# Patient Record
Sex: Female | Born: 1980 | Hispanic: Yes | Marital: Married | Smoking: Never smoker
Health system: Southern US, Community
[De-identification: ages and names within clinical notes are randomized; demographics above are authoritative.]

## PROBLEM LIST (undated history)

## (undated) ENCOUNTER — Inpatient Hospital Stay (HOSPITAL_COMMUNITY): Payer: Self-pay

## (undated) ENCOUNTER — Ambulatory Visit (HOSPITAL_COMMUNITY): Payer: Self-pay | Source: Home / Self Care

## (undated) DIAGNOSIS — I499 Cardiac arrhythmia, unspecified: Secondary | ICD-10-CM

## (undated) DIAGNOSIS — F419 Anxiety disorder, unspecified: Secondary | ICD-10-CM

## (undated) DIAGNOSIS — R51 Headache: Secondary | ICD-10-CM

## (undated) DIAGNOSIS — R519 Headache, unspecified: Secondary | ICD-10-CM

## (undated) DIAGNOSIS — A630 Anogenital (venereal) warts: Secondary | ICD-10-CM

## (undated) DIAGNOSIS — K219 Gastro-esophageal reflux disease without esophagitis: Secondary | ICD-10-CM

## (undated) DIAGNOSIS — O24419 Gestational diabetes mellitus in pregnancy, unspecified control: Secondary | ICD-10-CM

## (undated) DIAGNOSIS — N390 Urinary tract infection, site not specified: Secondary | ICD-10-CM

## (undated) DIAGNOSIS — D219 Benign neoplasm of connective and other soft tissue, unspecified: Secondary | ICD-10-CM

## (undated) DIAGNOSIS — R002 Palpitations: Secondary | ICD-10-CM

## (undated) DIAGNOSIS — Z87448 Personal history of other diseases of urinary system: Secondary | ICD-10-CM

## (undated) DIAGNOSIS — K635 Polyp of colon: Secondary | ICD-10-CM

## (undated) DIAGNOSIS — Z8619 Personal history of other infectious and parasitic diseases: Secondary | ICD-10-CM

## (undated) HISTORY — DX: Cardiac arrhythmia, unspecified: I49.9

## (undated) HISTORY — DX: Headache: R51

## (undated) HISTORY — DX: Gastro-esophageal reflux disease without esophagitis: K21.9

## (undated) HISTORY — DX: Personal history of other diseases of urinary system: Z87.448

## (undated) HISTORY — PX: DILATION AND CURETTAGE OF UTERUS: SHX78

## (undated) HISTORY — DX: Palpitations: R00.2

## (undated) HISTORY — DX: Headache, unspecified: R51.9

## (undated) HISTORY — DX: Anogenital (venereal) warts: A63.0

## (undated) HISTORY — DX: Anxiety disorder, unspecified: F41.9

## (undated) HISTORY — DX: Polyp of colon: K63.5

## (undated) HISTORY — DX: Personal history of other infectious and parasitic diseases: Z86.19

---

## 2001-01-07 ENCOUNTER — Encounter: Admission: RE | Admit: 2001-01-07 | Discharge: 2001-01-07 | Payer: Self-pay | Admitting: Hematology and Oncology

## 2001-01-07 ENCOUNTER — Encounter: Payer: Self-pay | Admitting: Internal Medicine

## 2011-03-13 LAB — HM PAP SMEAR: HM Pap smear: NORMAL

## 2012-12-29 ENCOUNTER — Ambulatory Visit: Payer: Self-pay | Admitting: Internal Medicine

## 2013-01-12 ENCOUNTER — Ambulatory Visit: Payer: Self-pay | Admitting: Internal Medicine

## 2013-04-15 ENCOUNTER — Ambulatory Visit (INDEPENDENT_AMBULATORY_CARE_PROVIDER_SITE_OTHER): Payer: BC Managed Care – PPO | Admitting: Internal Medicine

## 2013-04-15 ENCOUNTER — Encounter: Payer: Self-pay | Admitting: Internal Medicine

## 2013-04-15 VITALS — BP 118/74 | HR 76 | Temp 98.8°F | Ht 63.5 in | Wt 184.0 lb

## 2013-04-15 DIAGNOSIS — Z0000 Encounter for general adult medical examination without abnormal findings: Secondary | ICD-10-CM

## 2013-04-15 DIAGNOSIS — R519 Headache, unspecified: Secondary | ICD-10-CM | POA: Insufficient documentation

## 2013-04-15 DIAGNOSIS — R002 Palpitations: Secondary | ICD-10-CM

## 2013-04-15 DIAGNOSIS — Z13220 Encounter for screening for lipoid disorders: Secondary | ICD-10-CM

## 2013-04-15 DIAGNOSIS — R51 Headache: Secondary | ICD-10-CM

## 2013-04-15 DIAGNOSIS — R319 Hematuria, unspecified: Secondary | ICD-10-CM

## 2013-04-15 LAB — POCT URINALYSIS DIPSTICK
Bilirubin, UA: NEGATIVE
Glucose, UA: NEGATIVE
Ketones, UA: NEGATIVE
Nitrite, UA: NEGATIVE
Spec Grav, UA: 1.025
Urobilinogen, UA: 0.2
pH, UA: 6.5

## 2013-04-15 NOTE — Assessment & Plan Note (Signed)
Urinalysis today: trace blood, moderate leuks (no s/s of infection present) Will check CBC and CMET today  Will follow up after labs

## 2013-04-15 NOTE — Assessment & Plan Note (Signed)
?   Anxiety Will check lytes and TSH today If normal, will consider referral to cardiology for holter monitor ECK reviewed and scanned into chart

## 2013-04-15 NOTE — Patient Instructions (Signed)

## 2013-04-15 NOTE — Addendum Note (Signed)
Addended by: Lurlean Nanny on: 04/15/2013 04:46 PM   Modules accepted: Orders

## 2013-04-15 NOTE — Assessment & Plan Note (Signed)
Advised her to continue Ibuprofen Encouraged her to get an eye exam with optho

## 2013-04-15 NOTE — Progress Notes (Signed)
HPI  Pt presents to the clinic today to establish care. She does not have a PCP. She goes to UC when she needs something. She does have some concerns about palpitations, chest pain and her left side going numb. This started more than a year ago. She has been seen at [facility] for the same. Vitals and ECG were normal. She denies shortness of breath, dizziness with these episodes. She reports that the numbness can occur with or without the palpitations. She does have a h/o a herniated disc in her back but does not know if this is causing the numbness in her arms. Additionally, she c/o frequent headaches. The headaches originates in her temples causing her eyes to hurt. She does have some light sensitivity. Usually, she can take Ibuprofen and sleep the headache off. She has not had an eye exam.  She also c/o blood in her urine. She reports this started during her pregnancy. It has continued since that time. She was supposed to follow up with her PCP but has not been able to do so.  Flu: never Tetanus: 2013 LMP: 03/29/13 Pap Smear: 2013 Dentist: biannually  Past Medical History  Diagnosis Date  . Chicken pox   . Frequent headaches   . Arrhythmia   . H/O hematuria     No current outpatient prescriptions on file.   No current facility-administered medications for this visit.    No Known Allergies  Family History  Problem Relation Age of Onset  . Hyperlipidemia Mother   . Hypertension Mother   . Drug abuse Father   . Arthritis Maternal Grandmother   . Hyperlipidemia Maternal Grandmother   . Heart disease Maternal Grandmother   . Hypertension Maternal Grandmother   . Diabetes Maternal Grandmother   . Heart disease Maternal Grandfather     History   Social History  . Marital Status: Married    Spouse Name: N/A    Number of Children: N/A  . Years of Education: N/A   Occupational History  . Not on file.   Social History Main Topics  . Smoking status: Never Smoker   . Smokeless  tobacco: Not on file  . Alcohol Use: Yes     Comment: occasional  . Drug Use: No  . Sexual Activity: Not on file   Other Topics Concern  . Not on file   Social History Narrative  . No narrative on file    ROS:  Constitutional: Denies fever, malaise, fatigue, or abrupt weight changes.  HEENT: Denies eye pain, eye redness, ear pain, ringing in the ears, wax buildup, runny nose, nasal congestion, bloody nose, or sore throat. Respiratory: Denies difficulty breathing, shortness of breath, cough or sputum production.   Cardiovascular: Denies chest pain, chest tightness, or swelling in the hands or feet.  Gastrointestinal: Denies abdominal pain, bloating, constipation, diarrhea or blood in the stool.  GU: Denies frequency, urgency, pain with urination, blood in urine, odor or discharge. Musculoskeletal: Denies decrease in range of motion, difficulty with gait, muscle pain or joint pain and swelling.  Skin: Denies redness, rashes, lesions or ulcercations.  Neurological: Denies dizziness, difficulty with memory, difficulty with speech or problems with balance and coordination.   No other specific complaints in a complete review of systems (except as listed in HPI above).  PE:  BP 118/74  Pulse 76  Temp(Src) 98.8 F (37.1 C) (Oral)  Ht 5' 3.5" (1.613 m)  Wt 184 lb (83.462 kg)  BMI 32.08 kg/m2  SpO2 98%  LMP  03/29/2013 Wt Readings from Last 3 Encounters:  04/15/13 184 lb (83.462 kg)    General: Appears her stated age, well developed, well nourished in NAD. HEENT: Head: normal shape and size; Eyes: sclera white, no icterus, conjunctiva pink, PERRLA and EOMs intact, very sensitive to the light of the opthalmascope; Ears: Tm's gray and intact, normal light reflex; Nose: mucosa pink and moist, septum midline; Throat/Mouth: Teeth present, mucosa pink and moist, no lesions or ulcerations noted.  Neck: Normal range of motion. Neck supple, trachea midline. No massses, lumps or thyromegaly  present.  Cardiovascular: Normal rate and rhythm. S1,S2 noted.  No murmur, rubs or gallops noted. No JVD or BLE edema. No carotid bruits noted. Pulmonary/Chest: Normal effort and positive vesicular breath sounds. No respiratory distress. No wheezes, rales or ronchi noted.  Abdomen: Soft and nontender. Normal bowel sounds, no bruits noted. No distention or masses noted. Liver, spleen and kidneys non palpable. Musculoskeletal: Normal range of motion. No signs of joint swelling. No difficulty with gait.  Neurological: Alert and oriented. Cranial nerves II-XII intact. Coordination normal. +DTRs bilaterally. Psychiatric: Mood and affect normal. Behavior is normal. Judgment and thought content normal.      Assessment and Plan:  Health Maintenance:  She declines flu shot today Will check CBC, CMET and lipid profile today Advised her to work on diet and exercise

## 2013-04-15 NOTE — Progress Notes (Signed)
Pre-visit discussion using our clinic review tool. No additional management support is needed unless otherwise documented below in the visit note.  

## 2013-04-15 NOTE — Progress Notes (Signed)
HPI: Pt presents to the office today to establish care. She has been going to Urgent care for her acute needs and has not had a PCP in many years. Pt did have a few concerns to address today. She reports having some heart palpitations/ "flutters" with some sharp chest pain. The patient has been evaluated by ED and urgent care, without any abnormal findings. She reports this episodes to be worse at night and not associated with anything that she can recall. She does not drink caffeine, reports eating a balanced diet and exercising. She denies shortness of breath or difficulty breathing during these spells. The patient also reports concerns regarding numbness to left side, she has been evaluated by the ER and urgent care for this issue as well.  This numbness can be linked to heart palpitations as well as on there own. She does report having a herniated disc in her back and was told she this could be causing her numbness. She does not have difficulty gait, pain, or dizziness during these spells. She does not take anything at this time. They do go away on there own. Other concerns presented are regarding headaches and eye pain. Pt reports that in the evening she having severe light sensitivity which cause a headache at her temples and behind her eyes. She has not had her eyes examined in many years, she denies blurry vision or doubled vision. She is able to take OTC ibuprofen and sleep, which resolves this issues. Finally the patient requests to have a UA done due to previous concerns of blood in her urine. This started during her last pregnancy a year ago. She was told to follow up with a PCP, but has been unable to do  this.    Otherwise she is losing weight. Has lost about 8lbs in 3 weeks. Working out with husband and eating better.   Flu shot: never; declines Tetanus: received today; LMP: 03/29/13 Pap smear: 2014 Dentist: 2014  Past Medical History  Diagnosis Date  . Chicken pox   . Frequent headaches    . Arrhythmia   . H/O hematuria     No current outpatient prescriptions on file.   No current facility-administered medications for this visit.    No Known Allergies  Family History  Problem Relation Age of Onset  . Hyperlipidemia Mother   . Hypertension Mother   . Drug abuse Father   . Arthritis Maternal Grandmother   . Hyperlipidemia Maternal Grandmother   . Heart disease Maternal Grandmother   . Hypertension Maternal Grandmother   . Diabetes Maternal Grandmother   . Heart disease Maternal Grandfather     History   Social History  . Marital Status: Married    Spouse Name: N/A    Number of Children: N/A  . Years of Education: N/A   Occupational History  . Not on file.   Social History Main Topics  . Smoking status: Never Smoker   . Smokeless tobacco: Not on file  . Alcohol Use: Yes     Comment: occasional  . Drug Use: No  . Sexual Activity: Not on file   Other Topics Concern  . Not on file   Social History Narrative  . No narrative on file    ROS:  Constitutional:Endorses headaches at night.  Denies fever, malaise, fatigue,or abrupt weight changes.  HEENT:Endorses eye pain at night.  Denies eye pain, eye redness, ear pain, ringing in the ears, wax buildup, runny nose, nasal congestion, bloody nose, or sore  throat. Respiratory: Denies difficulty breathing, shortness of breath, cough or sputum production.   Cardiovascular:Endorses palpitation, chest pain, and tightness at times, denies any this current visit. Denies or swelling in the hands or feet.  Gastrointestinal: Denies abdominal pain, bloating, constipation, diarrhea or blood in the stool.  GU: Denies frequency, urgency, pain with urination, blood in urine, odor or discharge. Musculoskeletal: Denies decrease in range of motion, difficulty with gait, muscle pain or joint pain and swelling.  Skin: Denies redness, rashes, lesions or ulcercations.  Neurological: Denies dizziness, difficulty with memory,  difficulty with speech or problems with balance and coordination.   No other specific complaints in a complete review of systems (except as listed in HPI above).  PE:  Ht 5' 3.5" (1.613 m)  Wt 184 lb (83.462 kg)  BMI 32.08 kg/m2  LMP 03/29/2013 Wt Readings from Last 3 Encounters:  04/15/13 184 lb (83.462 kg)    General: Appears their stated age, well developed, well nourished in NAD. HEENT: Head: normal shape and size; Eyes: sclera white, no icterus, conjunctiva pink, PERRLA and EOMs intact; noted light sensitivity during exam.  Ears: Tm's gray and intact, normal light reflex; Nose: mucosa pink and moist, septum midline; Throat/Mouth: Teeth present, mucosa pink and moist, no lesions or ulcerations noted.  Neck: Normal range of motion. Neck supple, trachea midline. No massses, lumps or thyromegaly present.  Cardiovascular: Normal rate and rhythm. S1,S2 noted.  No murmur, rubs or gallops noted. No JVD or BLE edema. No carotid bruits noted. Pulmonary/Chest: Normal effort and positive vesicular breath sounds. No respiratory distress. No wheezes, rales or ronchi noted.  Abdomen: Soft and nontender. Normal bowel sounds, no bruits noted. No distention or masses noted. Liver, spleen and kidneys non palpable. Musculoskeletal: Normal range of motion. No signs of joint swelling. No difficulty with gait.  Neurological: Alert and oriented. Cranial nerves II-XII intact. Coordination normal. +DTRs bilaterally. Psychiatric: Mood and affect normal. Behavior is normal. Judgment and thought content normal.    Assessment and Plan: Health Maintenance: Will check lipid panel, CBC, CMET, and TSH Continue exercise and diet Will call with results  Heart Palpitations Will continue to follow Will check TSH today as well as lipid panel  If needed will refer to cardiology for Holter monitor  Headaches/Eye Pain: Referred to opthalmology for eye exam  Numbness: Will continue to follow May refer to physical  therapy if this continues to bother her  Hematuria UA shows trace blood Will check kidney function and other labs Follow up with patient  Follow up for lab results and pap smear in 2 weeks  Laquia Rosano S, Student-NP

## 2013-04-16 LAB — COMPREHENSIVE METABOLIC PANEL
ALT: 23 U/L (ref 0–35)
AST: 16 U/L (ref 0–37)
Albumin: 4.1 g/dL (ref 3.5–5.2)
Alkaline Phosphatase: 72 U/L (ref 39–117)
BUN: 14 mg/dL (ref 6–23)
CO2: 26 mEq/L (ref 19–32)
Calcium: 9.2 mg/dL (ref 8.4–10.5)
Chloride: 106 mEq/L (ref 96–112)
Creatinine, Ser: 0.6 mg/dL (ref 0.4–1.2)
GFR: 127.05 mL/min (ref >60.00)
Glucose, Bld: 69 mg/dL — ABNORMAL LOW (ref 70–99)
Potassium: 4 mEq/L (ref 3.5–5.1)
Sodium: 138 mEq/L (ref 135–145)
Total Bilirubin: 0.5 mg/dL (ref 0.3–1.2)
Total Protein: 7.4 g/dL (ref 6.0–8.3)

## 2013-04-16 LAB — CBC
HCT: 41.9 % (ref 36.0–46.0)
Hemoglobin: 13.5 g/dL (ref 12.0–15.0)
MCHC: 32.3 g/dL (ref 30.0–36.0)
MCV: 86.8 fl (ref 78.0–100.0)
Platelets: 332 10*3/uL (ref 150.0–400.0)
RBC: 4.83 Mil/uL (ref 3.87–5.11)
RDW: 14.2 % (ref 11.5–14.6)
WBC: 9.7 10*3/uL (ref 4.5–10.5)

## 2013-04-16 LAB — TSH: TSH: 1.6 u[IU]/mL (ref 0.35–5.50)

## 2013-04-16 LAB — LIPID PANEL
Cholesterol: 199 mg/dL (ref 0–200)
HDL: 37.1 mg/dL — ABNORMAL LOW (ref >39.00)
LDL Cholesterol: 124 mg/dL — ABNORMAL HIGH (ref 0–99)
Total CHOL/HDL Ratio: 5
Triglycerides: 192 mg/dL — ABNORMAL HIGH (ref 0.0–149.0)
VLDL: 38.4 mg/dL (ref 0.0–40.0)

## 2013-05-14 ENCOUNTER — Ambulatory Visit: Payer: BC Managed Care – PPO | Admitting: Internal Medicine

## 2013-05-15 ENCOUNTER — Ambulatory Visit (INDEPENDENT_AMBULATORY_CARE_PROVIDER_SITE_OTHER): Payer: BC Managed Care – PPO | Admitting: Internal Medicine

## 2013-05-15 ENCOUNTER — Encounter: Payer: Self-pay | Admitting: Internal Medicine

## 2013-05-15 VITALS — BP 116/74 | HR 65 | Temp 98.1°F | Wt 181.5 lb

## 2013-05-15 DIAGNOSIS — E781 Pure hyperglyceridemia: Secondary | ICD-10-CM

## 2013-05-15 DIAGNOSIS — R3129 Other microscopic hematuria: Secondary | ICD-10-CM

## 2013-05-15 DIAGNOSIS — R202 Paresthesia of skin: Secondary | ICD-10-CM

## 2013-05-15 DIAGNOSIS — R209 Unspecified disturbances of skin sensation: Secondary | ICD-10-CM

## 2013-05-15 NOTE — Progress Notes (Signed)
Subjective:    Patient ID: Cassandra [name], female    DOB: Jan 02, 1981, 33 y.o.   MRN: [id number]  HPI  Pt presents to the clinic today with c/o left arm numbness. This has started over 1 year ago. The numbness comes and goes. Her headaches, chest pain, palpitations and dizziness has gotten much better since last time. She does report that she has a herniated disc in her neck. She does take Ibuprofen on occasion. She is not interested in a referral to neurosurgery at this time.   She is concerned about her triglycerides of 192. She reports that her diet is not that bad.  She is also concerned about the microscopic hematuria. She reports this was present during her pregnancy and has not resolved. She would like a further workup and diagnostic testing.  Review of Systems      Past Medical History  Diagnosis Date  . Chicken pox   . Frequent headaches   . Arrhythmia   . H/O hematuria   . Nocturnal headaches   . Heart palpitations     No current outpatient prescriptions on file.   No current facility-administered medications for this visit.    No Known Allergies  Family History  Problem Relation Age of Onset  . Hyperlipidemia Mother   . Hypertension Mother   . Drug abuse Father   . Arthritis Maternal Grandmother   . Hyperlipidemia Maternal Grandmother   . Heart disease Maternal Grandmother   . Hypertension Maternal Grandmother   . Diabetes Maternal Grandmother   . Heart disease Maternal Grandfather     History   Social History  . Marital Status: Married    Spouse Name: N/A    Number of Children: N/A  . Years of Education: N/A   Occupational History  . Not on file.   Social History Main Topics  . Smoking status: Never Smoker   . Smokeless tobacco: Not on file  . Alcohol Use: Yes     Comment: occasional  . Drug Use: No  . Sexual Activity: Yes    Birth Control/ Protection: IUD   Other Topics Concern  . Not on file   Social History Narrative  . No narrative  on file     Constitutional: Denies fever, malaise, fatigue, headache or abrupt weight changes.  GU: Pt reports blood in urine. Denies urgency, frequency, pain with urination, burning sensation, odor or discharge. Musculoskeletal: Denies decrease in range of motion, difficulty with gait, muscle pain or joint pain and swelling.  Neurological: Pt reports left arm numbness.  Denies dizziness, difficulty with memory, difficulty with speech or problems with balance and coordination.   No other specific complaints in a complete review of systems (except as listed in HPI above).  Objective:   Physical Exam   BP 116/74  Pulse 65  Temp(Src) 98.1 F (36.7 C) (Oral)  Wt 181 lb 8 oz (82.328 kg)  SpO2 99%  LMP 03/29/2013 Wt Readings from Last 3 Encounters:  05/15/13 181 lb 8 oz (82.328 kg)  04/15/13 184 lb (83.462 kg)    General: Appears her stated age, well developed, well nourished in NAD. Neck: Normal range of motion. Neck supple, trachea midline. No massses, lumps or thyromegaly present.  Cardiovascular: Normal rate and rhythm. S1,S2 noted.  No murmur, rubs or gallops noted. No JVD or BLE edema. No carotid bruits noted. Pulmonary/Chest: Normal effort and positive vesicular breath sounds. No respiratory distress. No wheezes, rales or ronchi noted.  Abdomen: Soft and  nontender. Normal bowel sounds, no bruits noted. No distention or masses noted. Liver, spleen and kidneys non palpable. Neurological: Alert and oriented. Cranial nerves II-XII intact. Coordination normal. +DTRs bilaterally.   BMET    Component Value Date/Time   NA 138 04/15/2013 1554   K 4.0 04/15/2013 1554   CL 106 04/15/2013 1554   CO2 26 04/15/2013 1554   GLUCOSE 69* 04/15/2013 1554   BUN 14 04/15/2013 1554   CREATININE 0.6 04/15/2013 1554   CALCIUM 9.2 04/15/2013 1554    Lipid Panel     Component Value Date/Time   CHOL 199 04/15/2013 1554   TRIG 192.0* 04/15/2013 1554   HDL 37.10* 04/15/2013 1554   CHOLHDL 5 04/15/2013 1554   VLDL  38.4 04/15/2013 1554   LDLCALC 124* 04/15/2013 1554    CBC    Component Value Date/Time   WBC 9.7 04/15/2013 1554   RBC 4.83 04/15/2013 1554   HGB 13.5 04/15/2013 1554   HCT 41.9 04/15/2013 1554   PLT 332.0 04/15/2013 1554   MCV 86.8 04/15/2013 1554   MCHC 32.3 04/15/2013 1554   RDW 14.2 04/15/2013 1554    Hgb A1C No results found for this basename: HGBA1C        Assessment & Plan:   Left arm paresthesia:  Try Ibuprofen OTC If pain becomes worse or more constant, can refer to neurosurgery for further evaluations Will look for most recent xrays- may need to be repeated, may need MRI  Microscopic hematuria:  She seems very concerned about this Will refer to urology for further evaluation and treatment  RTC as needed

## 2013-05-15 NOTE — Assessment & Plan Note (Signed)
Info given on low fat, low cholesterol diet Take fishoil OTC Will recheck in1 year

## 2013-05-15 NOTE — Progress Notes (Signed)
Pre visit review using our clinic review tool, if applicable. No additional management support is needed unless otherwise documented below in the visit note. 

## 2013-05-15 NOTE — Patient Instructions (Addendum)
Paresthesia °Paresthesia is an abnormal burning or prickling sensation. This sensation is generally felt in the hands, arms, legs, or feet. However, it may occur in any part of the body. It is usually not painful. The feeling may be described as: °· Tingling or numbness. °· "Pins and needles." °· Skin crawling. °· Buzzing. °· Limbs "falling asleep." °· Itching. °Most people experience temporary (transient) paresthesia at some time in their lives. °CAUSES  °Paresthesia may occur when you breathe too quickly (hyperventilation). It can also occur without any apparent cause. Commonly, paresthesia occurs when pressure is placed on a nerve. The feeling quickly goes away once the pressure is removed. For some people, however, paresthesia is a long-lasting (chronic) condition caused by an underlying disorder. The underlying disorder may be: °· A traumatic, direct injury to nerves. Examples include a: °· Broken (fractured) neck. °· Fractured skull. °· A disorder affecting the brain and spinal cord (central nervous system). Examples include: °· Transverse myelitis. °· Encephalitis. °· Transient ischemic attack. °· Multiple sclerosis. °· Stroke. °· Tumor or blood vessel problems, such as an arteriovenous malformation pressing against the brain or spinal cord. °· A condition that damages the peripheral nerves (peripheral neuropathy). Peripheral nerves are not part of the brain and spinal cord. These conditions include: °· Diabetes. °· Peripheral vascular disease. °· Nerve entrapment syndromes, such as carpal tunnel syndrome. °· Shingles. °· Hypothyroidism. °· Vitamin B12 deficiencies. °· Alcoholism. °· Heavy metal poisoning (lead, arsenic). °· Rheumatoid arthritis. °· Systemic lupus erythematosus. °DIAGNOSIS  °Your caregiver will attempt to find the underlying cause of your paresthesia. Your caregiver may: °· Take your medical history. °· Perform a physical exam. °· Order various lab tests. °· Order imaging tests. °TREATMENT    °Treatment for paresthesia depends on the underlying cause. °HOME CARE INSTRUCTIONS °· Avoid drinking alcohol. °· You may consider massage or acupuncture to help relieve your symptoms. °· Keep all follow-up appointments as directed by your caregiver. °SEEK IMMEDIATE MEDICAL CARE IF:  °· You feel weak. °· You have trouble walking or moving. °· You have problems with speech or vision. °· You feel confused. °· You cannot control your bladder or bowel movements. °· You feel numbness after an injury. °· You faint. °· Your burning or prickling feeling gets worse when walking. °· You have pain, cramps, or dizziness. °· You develop a rash. °MAKE SURE YOU: °· Understand these instructions. °· Will watch your condition. °· Will get help right away if you are not doing well or get worse. °Document Released: 02/16/2002 Document Revised: 05/21/2011 Document Reviewed: 11/17/2010 °ExitCare® Patient Information ©2014 [employer]. ° °

## 2013-10-20 ENCOUNTER — Telehealth: Payer: Self-pay | Admitting: Internal Medicine

## 2013-10-20 NOTE — Telephone Encounter (Signed)
She needs to come for an office visit

## 2013-10-20 NOTE — Telephone Encounter (Signed)
Patient Information:  Caller Name: Corin  Phone: [phone]  Patient: Cassandra [name], Cassandra [name]  Gender: Female  DOB: Jul 14, 1980  Age: 33 Years  PCP: Webb Silversmith  Pregnant: No  Office Follow Up:  Does the office need to follow up with this patient?: Yes  Instructions For The Office: Needs further instructions regarding scalp issues.  RN Note:  Patient also reports sections of her scalp burn at times. These are different areas. There is no redness, swelling or bruising noted. Please contact her at [phone] to give further instructions. She verbalized understanding of care advice for swimmer's ear pain.  Symptoms  Reason For Call & Symptoms: Reports left earache. Much worse when touching or moving the ear.  Reviewed Health History In EMR: Yes  Reviewed Medications In EMR: Yes  Reviewed Allergies In EMR: Yes  Reviewed Surgeries / Procedures: Yes  Date of Onset of Symptoms: 10/19/2013 OB / GYN:  LMP: 10/12/2013  Guideline(s) Used:  Earache  Ear - Swimmer's - Otitis Externa  Disposition Per Guideline:   Home Care  Reason For Disposition Reached:   Swimmer's Ear with no complications  Advice Given:  White Vinegar Rinses.   instructions on how to do this:  Lie down with the affected ear upward. Fill the ear canal.  After 5 minutes, remove the fluid by tilting the head to one side and gently pulling on the ear.  Continue doing this twice daily until the ear canal returns to normal.  CAUTION: Do not do use if you have ear tubes or hole in eardrum.  Pain Medicines:  For pain relief, you can take either acetaminophen, ibuprofen, or naproxen.  Ibuprofen (e.g., Motrin, Advil):  Take 400 mg (two 200 mg pills) by mouth every 6 hours.  Expected Course:  With treatment, symptoms should improve in 3 days and resolve within 7 days.  Avoid Earplugs:   If pus or cloudy fluid is draining from the ear canal, wipe the pus away as it appears. Avoid plugging with cotton (Reason: retained pus causes  irritation or infection of the ear canal).  Avoid Swimming:  Try to avoid swimming until symptoms are gone.  Call Back If:  You become worse.  Patient Will Follow Care Advice:  YES

## 2013-10-21 NOTE — Telephone Encounter (Signed)
Left detailed msg on VM per HIPAA letting pt know information as instructed

## 2013-11-12 ENCOUNTER — Telehealth: Payer: Self-pay | Admitting: Internal Medicine

## 2013-11-12 NOTE — Telephone Encounter (Signed)
Patient Information:  Caller Name: Brittony  Phone: [phone]  Patient: Destinee, Taber  Gender: Female  DOB: 11-13-1980  Age: 33 Years  PCP: Webb Silversmith  Pregnant: No  Office Follow Up:  Does the office need to follow up with this patient?: Yes  Instructions For The Office: see notes  RN Note:  She states she went to pharmacy to see if she could find something to treat it and was told by pharmacist to call office b/c will likely need prescription med. Pt states she is uninsured at the moment and she would like to know if the doctor will prescribe anything w/o an appt. Assured her will send message and someone will call her back today.  Symptoms  Reason For Call & Symptoms: Half of toenail on R big toe has been yellow for approx 6 months and then 2 wks ago turned green (states it looks "moldy") and then a week ago became painful. Denies seeing pus, denies skin redness around nail, no fever or any other sxs. During triage she she began to manipulate the toenail a bit and became concerned that it may be detaching from the skin.  Reviewed Health History In EMR: Yes  Reviewed Medications In EMR: Yes  Reviewed Allergies In EMR: Yes  Reviewed Surgeries / Procedures: Yes  Date of Onset of Symptoms: 10/29/2013 OB / GYN:  LMP: 10/27/2013  Guideline(s) Used:  Toe Pain  Disposition Per Guideline:   See Within 2 Weeks in Office  Reason For Disposition Reached:   Mild pain (e.g., does not interfere with normal activities) and present > 7 days  Advice Given:  N/A  Patient Will Follow Care Advice:  YES

## 2013-11-12 NOTE — Telephone Encounter (Signed)
She will need an appt.

## 2013-11-12 NOTE — Telephone Encounter (Signed)
Patient mad an appt for 11/17/13

## 2013-11-13 ENCOUNTER — Ambulatory Visit: Payer: BC Managed Care – PPO | Admitting: Family Medicine

## 2013-11-13 ENCOUNTER — Encounter (HOSPITAL_COMMUNITY): Payer: Self-pay | Admitting: Emergency Medicine

## 2013-11-13 ENCOUNTER — Emergency Department (HOSPITAL_COMMUNITY)
Admission: EM | Admit: 2013-11-13 | Discharge: 2013-11-13 | Disposition: A | Discharge status: Home / Self Care | Payer: BC Managed Care – PPO | Attending: Emergency Medicine | Admitting: Emergency Medicine

## 2013-11-13 ENCOUNTER — Telehealth: Payer: Self-pay | Admitting: Internal Medicine

## 2013-11-13 DIAGNOSIS — B9689 Other specified bacterial agents as the cause of diseases classified elsewhere: Secondary | ICD-10-CM | POA: Insufficient documentation

## 2013-11-13 DIAGNOSIS — N898 Other specified noninflammatory disorders of vagina: Secondary | ICD-10-CM | POA: Insufficient documentation

## 2013-11-13 DIAGNOSIS — F410 Panic disorder [episodic paroxysmal anxiety] without agoraphobia: Secondary | ICD-10-CM

## 2013-11-13 DIAGNOSIS — N760 Acute vaginitis: Secondary | ICD-10-CM | POA: Insufficient documentation

## 2013-11-13 DIAGNOSIS — B351 Tinea unguium: Secondary | ICD-10-CM | POA: Insufficient documentation

## 2013-11-13 DIAGNOSIS — A499 Bacterial infection, unspecified: Secondary | ICD-10-CM | POA: Insufficient documentation

## 2013-11-13 DIAGNOSIS — Z8679 Personal history of other diseases of the circulatory system: Secondary | ICD-10-CM | POA: Insufficient documentation

## 2013-11-13 DIAGNOSIS — Z3202 Encounter for pregnancy test, result negative: Secondary | ICD-10-CM | POA: Insufficient documentation

## 2013-11-13 LAB — URINALYSIS, ROUTINE W REFLEX MICROSCOPIC
Bilirubin Urine: NEGATIVE
Glucose, UA: NEGATIVE mg/dL
Hgb urine dipstick: NEGATIVE
Ketones, ur: NEGATIVE mg/dL
Nitrite: NEGATIVE
Protein, ur: NEGATIVE mg/dL
Specific Gravity, Urine: 1.028 (ref 1.005–1.030)
Urobilinogen, UA: 1 mg/dL (ref 0.0–1.0)
pH: 6 (ref 5.0–8.0)

## 2013-11-13 LAB — WET PREP, GENITAL
Trich, Wet Prep: NONE SEEN
Yeast Wet Prep HPF POC: NONE SEEN

## 2013-11-13 LAB — POC URINE PREG, ED: Preg Test, Ur: NEGATIVE

## 2013-11-13 LAB — URINE MICROSCOPIC-ADD ON

## 2013-11-13 MED ORDER — METRONIDAZOLE 500 MG PO TABS: 500.0000 mg | ORAL_TABLET | Freq: Two times a day (BID) | ORAL | Status: DC

## 2013-11-13 NOTE — ED Notes (Signed)
Pt c/o lower abd.pain, pain with intercourse, toenail discoloration, fatigue, panic attack. Was told to come to ed by her PCP

## 2013-11-13 NOTE — ED Provider Notes (Signed)
TIME SEEN: 4:35 PM  CHIEF COMPLAINT: right toenail discoloration and pain x 8 months, lower abdominal pain and vaginal discharge x 2 years, panic attack today  HPI: Pt is a 33 y.o. F with h/o headaches who presents to the ED with multiple complaints.  First she complains of yellow right great toenail discoloration x 8 months.  She states it started hurting today and she called her PCP who instructed her to come to the ED.  No injury.  She did have some tingling in her toe last night.  No h/o gout.  No fever.  No swelling.  She has not taken any medication for the toenail discoloration or pain.  She has not seen her PCP for this.   Second she complains of a panic attack today where she became upset, started uncontrollably crying, she tingling all over, felt her heart racing.  No chest pain, SOB.  She is now back to baseline.     Third she is complaining of 2 years of vaginal discharge and pelvic pain worse with intercourse.  She is a [id number].  LMP was 8/18 and her periods are irregular as she has a Mirena IUD.  She states she had her daughter 2 1/2 years ago and had the IUD placed and then began having pain.  She is sexually active with her husband.  She has not discussed this with her PCP or OBGYN.  States she started looking up her symptoms online and is worried she has cervical cancer.  She has had an abnormal pap smear in the past.  No N/V/D, dysuria or hematuria.  ROS: See HPI Constitutional: no fever  Eyes: no drainage  ENT: no runny nose   Cardiovascular:  no chest pain  Resp: no SOB  GI: no vomiting GU: no dysuria Integumentary: no rash  Allergy: no hives  Musculoskeletal: no leg swelling  Neurological: no slurred speech ROS otherwise negative  PAST MEDICAL HISTORY/PAST SURGICAL HISTORY:  Past Medical History  Diagnosis Date  . Chicken pox   . Frequent headaches   . Arrhythmia   . H/O hematuria   . Nocturnal headaches   . Heart palpitations     MEDICATIONS:  Prior to  Admission medications   Not on File    ALLERGIES:  No Known Allergies  SOCIAL HISTORY:  History  Substance Use Topics  . Smoking status: Never Smoker   . Smokeless tobacco: Not on file  . Alcohol Use: Yes     Comment: occasional    FAMILY HISTORY: Family History  Problem Relation Age of Onset  . Hyperlipidemia Mother   . Hypertension Mother   . Drug abuse Father   . Arthritis Maternal Grandmother   . Hyperlipidemia Maternal Grandmother   . Heart disease Maternal Grandmother   . Hypertension Maternal Grandmother   . Diabetes Maternal Grandmother   . Heart disease Maternal Grandfather     EXAM: BP 133/81  Pulse 91  Temp(Src) 98.7 F (37.1 C) (Oral)  Resp 20  SpO2 98% CONSTITUTIONAL: Alert and oriented and responds appropriately to questions. Well-appearing; well-nourished HEAD: Normocephalic EYES: Conjunctivae clear, PERRL ENT: normal nose; no rhinorrhea; moist mucous membranes; pharynx without lesions noted NECK: Supple, no meningismus, no LAD  CARD: RRR; S1 and S2 appreciated; no murmurs, no clicks, no rubs, no gallops RESP: Normal chest excursion without splinting or tachypnea; breath sounds clear and equal bilaterally; no wheezes, no rhonchi, no rales,  ABD/GI: Normal bowel sounds; non-distended; soft, non-tender, no rebound, no guarding GU:  Normal external genitalia, no vaginal bleeding, minimal amount of thin white vaginal discharge, no adnexal tenderness or fullness, no CMT BACK:  The back appears normal and is non-tender to palpation, there is no CVA tenderness EXT: Normal ROM in all joints; non-tender to palpation; no edema; normal capillary refill; no cyanosis; R great toe is nontender to palpation, sensation to light touch is intact diffusely, no joint effusion, no erythema or warmth, toenails have toenail polish on them, no disfiguration of toenails    SKIN: Normal color for age and race; warm NEURO: Moves all extremities equally PSYCH: The patient's mood  and manner are appropriate. Grooming and personal hygiene are appropriate.  MEDICAL DECISION MAKING:   Pt here with multiple complaints.  She has no sign of septic arthritis, gout, paronychia on exam.  Discussed with pt that she should follow up with her PCP for this and can alternate Tylenol and motrin for pain.      Pt also appears to have had a panic attack.  No h/o ACS and no risk factors for the same.   She is PERC negative.  I do not feel she needs further workup for this today. No current psychiatric safety concerns.   She is also complaining of 2 years of vaginal discharge abdominal pain. Her abdominal exam is very benign. GU exam appears normal. Cultures pending. Urine pending. Urine pregnancy negative. Discussed with patient she needs to followup with her PCP in OB/GYN for this. Have recommended a Pap smear.  ED PROGRESS: Patient has moderate clue cells. Urine shows moderate leukocytes but also a few bacteria and squamous cells. Suspect dirty catch. She has no urinary symptoms. We'll treat for bacterial vaginosis. Have instructed her to followup with her PCP and OB/GYN. Have discussed return precautions. She verbalized understanding and is comfortable with plan.     [place], DO 11/13/13 1758

## 2013-11-13 NOTE — Discharge Instructions (Signed)
Bacterial Vaginosis Bacterial vaginosis is a vaginal infection that occurs when the normal balance of bacteria in the vagina is disrupted. It results from an overgrowth of certain bacteria. This is the most common vaginal infection in women of childbearing age. Treatment is important to prevent complications, especially in pregnant women, as it can cause a premature delivery. CAUSES  Bacterial vaginosis is caused by an increase in harmful bacteria that are normally present in smaller amounts in the vagina. Several different kinds of bacteria can cause bacterial vaginosis. However, the reason that the condition develops is not fully understood. RISK FACTORS Certain activities or behaviors can put you at an increased risk of developing bacterial vaginosis, including:  Having a new sex partner or multiple sex partners.  Douching.  Using an intrauterine device (IUD) for contraception. Women do not get bacterial vaginosis from toilet seats, bedding, swimming pools, or contact with objects around them. SIGNS AND SYMPTOMS  Some women with bacterial vaginosis have no signs or symptoms. Common symptoms include:  Grey vaginal discharge.  A fishlike odor with discharge, especially after sexual intercourse.  Itching or burning of the vagina and vulva.  Burning or pain with urination. DIAGNOSIS  Your health care provider will take a medical history and examine the vagina for signs of bacterial vaginosis. A sample of vaginal fluid may be taken. Your health care provider will look at this sample under a microscope to check for bacteria and abnormal cells. A vaginal pH test may also be done.  TREATMENT  Bacterial vaginosis may be treated with antibiotic medicines. These may be given in the form of a pill or a vaginal cream. A second round of antibiotics may be prescribed if the condition comes back after treatment.  HOME CARE INSTRUCTIONS   Only take over-the-counter or prescription medicines as  directed by your health care provider.  If antibiotic medicine was prescribed, take it as directed. Make sure you finish it even if you start to feel better.  Do not have sex until treatment is completed.  Tell all sexual partners that you have a vaginal infection. They should see their health care provider and be treated if they have problems, such as a mild rash or itching.  Practice safe sex by using condoms and only having one sex partner. SEEK MEDICAL CARE IF:   Your symptoms are not improving after 3 days of treatment.  You have increased discharge or pain.  You have a fever. MAKE SURE YOU:   Understand these instructions.  Will watch your condition.  Will get help right away if you are not doing well or get worse. FOR MORE INFORMATION  Centers for Disease Control and Prevention, Division of STD Prevention: AppraiserFraud.fi American Sexual Health Association (ASHA): www.ashastd.org  Document Released: 02/26/2005 Document Revised: 12/17/2012 Document Reviewed: 10/08/2012 [facility] Patient Information 2015 [place], [place]. This information is not intended to replace advice given to you by your health care provider. Make sure you discuss any questions you have with your health care provider.    [email or web address] [place] [address] [place], [place] [phone]    [email or web address] [address] [place], [place] ([place]) [place] # [place], [place] [phone]   [facility] [email or web address] [address] [place], [place] [phone]   [facility] [email or web address] [address] [place], [place] [phone]   [facility]  [email or web address] [place], [place] [phone]  Panic Attacks Panic attacks are sudden, short-livedsurges  of severe anxiety, fear, or discomfort. They may occur for no reason when you are relaxed, when you are anxious, or when you are sleeping. Panic attacks may occur for a number of reasons:   Healthy people occasionally have panic attacks in extreme, life-threatening situations, such as war or natural disasters. Normal anxiety is a protective mechanism of the body that helps [place] react to danger (fight or flight response).  Panic attacks are often seen with anxiety disorders, such as panic disorder, social anxiety disorder, generalized anxiety disorder, and phobias. Anxiety disorders cause excessive or uncontrollable anxiety. They may interfere with your relationships or other life activities.  Panic attacks are sometimes seen with other mental illnesses, such as depression and posttraumatic stress disorder.  Certain medical conditions, prescription medicines, and drugs of abuse can cause panic attacks. SYMPTOMS  Panic attacks start suddenly, peak within 20 minutes, and are accompanied by four or more of the following symptoms:  Pounding heart or fast heart rate (palpitations).  Sweating.  Trembling or shaking.  Shortness of breath or feeling smothered.  Feeling choked.  Chest pain or discomfort.  Nausea or strange feeling in your stomach.  Dizziness, light-headedness, or feeling like you will faint.  Chills or hot flushes.  Numbness or tingling in your lips or hands and feet.  Feeling that things are not real or feeling that you are not yourself.  Fear of losing control or going crazy.  Fear of dying. Some of these symptoms can mimic serious medical conditions. For example, you may think you are having a heart attack. Although panic attacks can be very scary, they are not life threatening. DIAGNOSIS  Panic attacks are diagnosed through an assessment by your health care provider. Your health care provider will ask questions about your symptoms, such as where and when they occurred.  Your health care provider will also ask about your medical history and use of alcohol and drugs, including prescription medicines. Your health care provider may order blood tests or other studies to rule out a serious medical condition. Your health care provider may refer you to a mental health professional for further evaluation. TREATMENT   Most healthy people who have one or two panic attacks in an extreme, life-threatening situation will not require treatment.  The treatment for panic attacks associated with anxiety disorders or other mental illness typically involves counseling with a mental health professional, medicine, or a combination of both. Your health care provider will help determine what treatment is best for you.  Panic attacks due to physical illness usually go away with treatment of the illness. If prescription medicine is causing panic attacks, talk with your health care provider about stopping the medicine, decreasing the dose, or substituting another medicine.  Panic attacks due to alcohol or drug abuse go away with abstinence. Some adults need professional help in order to stop drinking or using drugs. HOME CARE INSTRUCTIONS   Take all medicines as directed by your health care provider.   Schedule and attend follow-up visits as directed by your health care provider. It is important to keep all your appointments. SEEK MEDICAL CARE IF:  You are not able to take your medicines as prescribed.  Your symptoms do not improve or get worse. SEEK IMMEDIATE MEDICAL CARE IF:   You experience panic attack symptoms that are different than your usual symptoms.  You have serious thoughts about hurting yourself or  others.  You are taking medicine for panic attacks and have a serious side effect. MAKE SURE YOU:  Understand these instructions.  Will watch your condition.  Will get help right away if you are not doing well or get worse. Document Released: 02/26/2005 Document  Revised: 03/03/2013 Document Reviewed: 10/10/2012 [facility] Patient Information 2015 [place], [place]. This information is not intended to replace advice given to you by your health care provider. Make sure you discuss any questions you have with your health care provider.   Ringworm, Nail A fungal infection of the nail (tinea unguium/onychomycosis) is common. It is common as the visible part of the nail is composed of dead cells which have no blood supply to help prevent infection. It occurs because fungi are everywhere and will pick any opportunity to grow on any dead material. Because nails are very slow growing they require up to 2 years of treatment with anti-fungal medications. The entire nail back to the base is infected. This includes approximately  of the nail which you cannot see. If your caregiver has prescribed a medication by mouth, take it every day and as directed. No progress will be seen for at least 6 to 9 months. Do not be disappointed! Because fungi live on dead cells with little or no exposure to blood supply, medication delivery to the infection is slow; thus the cure is slow. It is also why you can observe no progress in the first 6 months. The nail becoming cured is the base of the nail, as it has the blood supply. Topical medication such as creams and ointments are usually not effective. Important in successful treatment of nail fungus is closely following the medication regimen that your doctor prescribes. Sometimes you and your caregiver may elect to speed up this process by surgical removal of all the nails. Even this may still require 6 to 9 months of additional oral medications. See your caregiver as directed. Remember there will be no visible improvement for at least 6 months. See your caregiver sooner if other signs of infection (redness and swelling) develop. Document Released: 02/24/2000 Document Revised: 05/21/2011 Document Reviewed: 05/04/2008 [facility] Patient  Information 2015 [place], [place]. This information is not intended to replace advice given to you by your health care provider. Make sure you discuss any questions you have with your health care provider.

## 2013-11-13 NOTE — Telephone Encounter (Signed)
Agree, ER, routed to PCP as FYI.

## 2013-11-13 NOTE — Telephone Encounter (Signed)
Patient Information:  Caller Name: Cassandra [name]  Phone: [phone]  Patient: Cassandra [name], Cassandra [name]  Gender: Female  DOB: 12-03-1980  Age: 33 Years  PCP: Webb Silversmith  Pregnant: No  Office Follow Up:  Does the office need to follow up with this patient?: No  Instructions For The Office: N/A   Symptoms  Reason For Call & Symptoms: Pts sister is calling on her behalf because the pt  can't speak. Pt has severe pelvic pain and was looking up cervical cancer and feels thats what she is having. She has had this pain x 2 years.  Pt is having a full blown anxiety attack/she can't speak.  Her sister is speaking for her. The  sister states the pt  Has had  a sharp pain under her pubic bone/and abdominal area and is having vaginal discharge that is brown and foul smelling. She has an appt today at 4pm but it is for a toenail fungus. Pt is cold/shaky/blurred vision.  Reviewed Health History In EMR: Yes  Reviewed Medications In EMR: Yes  Reviewed Allergies In EMR: Yes  Reviewed Surgeries / Procedures: Yes  Date of Onset of Symptoms: 11/13/2013 OB / GYN:  LMP: 10/27/2013  Guideline(s) Used:  Vaginal Discharge  Disposition Per Guideline:   Go to ED Now (or to Office with PCP Approval)  Reason For Disposition Reached:   Severe abdominal pain (e.g., excruciating)  Advice Given:  N/A  RN Overrode Recommendation:  Follow Up With Office Later  RN adivsed 911 since pt was lightheaded with blurred vision; cold and clammy and couldn't speak. The sister will comply and asked that the nurse cancel the 4pm appt time.

## 2013-11-14 LAB — GC/CHLAMYDIA PROBE AMP
CT Probe RNA: NEGATIVE
GC Probe RNA: NEGATIVE

## 2013-11-17 ENCOUNTER — Encounter: Payer: Self-pay | Admitting: Internal Medicine

## 2013-11-17 ENCOUNTER — Ambulatory Visit (INDEPENDENT_AMBULATORY_CARE_PROVIDER_SITE_OTHER): Payer: Self-pay | Admitting: Internal Medicine

## 2013-11-17 ENCOUNTER — Ambulatory Visit: Payer: BC Managed Care – PPO | Admitting: Internal Medicine

## 2013-11-17 VITALS — BP 104/64 | HR 90 | Temp 98.3°F | Wt 179.0 lb

## 2013-11-17 DIAGNOSIS — R110 Nausea: Secondary | ICD-10-CM

## 2013-11-17 DIAGNOSIS — B9689 Other specified bacterial agents as the cause of diseases classified elsewhere: Secondary | ICD-10-CM

## 2013-11-17 DIAGNOSIS — R0789 Other chest pain: Secondary | ICD-10-CM

## 2013-11-17 DIAGNOSIS — A499 Bacterial infection, unspecified: Secondary | ICD-10-CM

## 2013-11-17 DIAGNOSIS — B351 Tinea unguium: Secondary | ICD-10-CM

## 2013-11-17 DIAGNOSIS — N760 Acute vaginitis: Secondary | ICD-10-CM

## 2013-11-17 DIAGNOSIS — R5383 Other fatigue: Principal | ICD-10-CM

## 2013-11-17 DIAGNOSIS — R5381 Other malaise: Secondary | ICD-10-CM

## 2013-11-17 DIAGNOSIS — R42 Dizziness and giddiness: Secondary | ICD-10-CM

## 2013-11-17 DIAGNOSIS — R0602 Shortness of breath: Secondary | ICD-10-CM

## 2013-11-17 LAB — COMPREHENSIVE METABOLIC PANEL
ALT: 17 U/L (ref 0–35)
AST: 16 U/L (ref 0–37)
Albumin: 4.1 g/dL (ref 3.5–5.2)
Alkaline Phosphatase: 58 U/L (ref 39–117)
BUN: 11 mg/dL (ref 6–23)
CALCIUM: 9.1 mg/dL (ref 8.4–10.5)
CHLORIDE: 107 meq/L (ref 96–112)
CO2: 23 mEq/L (ref 19–32)
CREATININE: 0.6 mg/dL (ref 0.4–1.2)
GFR: 119.44 mL/min (ref >60.00)
GLUCOSE: 93 mg/dL (ref 70–99)
Potassium: 3.8 mEq/L (ref 3.5–5.1)
Sodium: 138 mEq/L (ref 135–145)
Total Bilirubin: 0.6 mg/dL (ref 0.2–1.2)
Total Protein: 7.3 g/dL (ref 6.0–8.3)

## 2013-11-17 LAB — CBC
HCT: 39.8 % (ref 36.0–46.0)
HEMOGLOBIN: 13.5 g/dL (ref 12.0–15.0)
MCHC: 33.8 g/dL (ref 30.0–36.0)
MCV: 86 fl (ref 78.0–100.0)
PLATELETS: 305 10*3/uL (ref 150.0–400.0)
RBC: 4.63 Mil/uL (ref 3.87–5.11)
RDW: 12.9 % (ref 11.5–15.5)
WBC: 8.9 10*3/uL (ref 4.0–10.5)

## 2013-11-17 NOTE — Progress Notes (Signed)
Subjective:    Patient ID: Cassandra [name], female    DOB: 30-Jul-1980, 33 y.o.   MRN: [id number]  HPI  Pt presents to the clinic today for ER follow up. She went to the ER 9/4 with c/o toenail discoloration and pain. This had started 8 months prior. She had no apparent injury to the toe that she can recall. She had not yet tried anything OTC. She also c/o vaginal discharge that had been going on for 2 years. She reports the discharge was dark in color. She also has pain with intercourse. After looking up her symptoms online, it stated that her symptoms were similar to that of cervical cancer. This caused her to have a full blown panic attack, with shortness of breath. She did call [facility], who advised her to be seen in the ED. ED workup was negative. She was treated for BV with flagyl. Her last pap was in 2013 which was normal. She would like another pap today if she can get it. The hospital did not address her toenail fungus, she would like to discuss treatment today.   Review of Systems      Past Medical History  Diagnosis Date  . Chicken pox   . Frequent headaches   . Arrhythmia   . H/O hematuria   . Nocturnal headaches   . Heart palpitations     Current Outpatient Prescriptions  Medication Sig Dispense Refill  . metroNIDAZOLE (FLAGYL) 500 MG tablet Take 1 tablet (500 mg total) by mouth 2 (two) times daily. Do not drink alcohol with this medication.  14 tablet  0   No current facility-administered medications for this visit.    No Known Allergies  Family History  Problem Relation Age of Onset  . Hyperlipidemia Mother   . Hypertension Mother   . Drug abuse Father   . Arthritis Maternal Grandmother   . Hyperlipidemia Maternal Grandmother   . Heart disease Maternal Grandmother   . Hypertension Maternal Grandmother   . Diabetes Maternal Grandmother   . Heart disease Maternal Grandfather     History   Social History  . Marital Status: Married    Spouse Name: N/A    Number  of Children: N/A  . Years of Education: N/A   Occupational History  . Not on file.   Social History Main Topics  . Smoking status: Never Smoker   . Smokeless tobacco: Not on file  . Alcohol Use: Yes     Comment: occasional  . Drug Use: No  . Sexual Activity: Yes    Birth Control/ Protection: IUD   Other Topics Concern  . Not on file   Social History Narrative  . No narrative on file     Constitutional: Pt reports fatigue. Denies fever, malaise, headache or abrupt weight changes.  Respiratory: Pt reports shortness of breath. Denies difficulty breathing, cough or sputum production.   Cardiovascular: Pt reports chest tightness. Denies chest pain, palpitations or swelling in the hands or feet.  Gastrointestinal: Pt reports nausea. Denies abdominal pain, bloating, constipation, diarrhea or blood in the stool.  Skin: Pt reports toenail fungus. Denies redness, rashes, lesions or ulcercations.  Neurological: Pt reports dizziness. Denies problems with balance and coordination.   No other specific complaints in a complete review of systems (except as listed in HPI above).  Objective:   Physical Exam  BP 104/64  Pulse 90  Temp(Src) 98.3 F (36.8 C) (Oral)  Wt 179 lb (81.194 kg)  SpO2 98%  LMP 10/27/2013 Wt Readings from Last 3 Encounters:  11/17/13 179 lb (81.194 kg)  05/15/13 181 lb 8 oz (82.328 kg)  04/15/13 184 lb (83.462 kg)    General: Appears her stated age, well developed, well nourished in NAD. Skin: Toenail fungus noted of right great toe. Nail is lifting away from the nail bed. Cardiovascular: Normal rate and rhythm. S1,S2 noted.  No murmur, rubs or gallops noted.  Pulmonary/Chest: Normal effort and positive vesicular breath sounds. No respiratory distress. No wheezes, rales or ronchi noted.  Abdomen: Soft and nontender. Normal bowel sounds, no bruits noted. No distention or masses noted. Liver, spleen and kidneys non palpable. Neurological: Alert and oriented.  Cranial nerves II-XII grossly intact.  Psychiatric: Mood anxious and affect normal. Behavior is normal. Judgment and thought content normal.     BMET    Component Value Date/Time   NA 138 04/15/2013 1554   K 4.0 04/15/2013 1554   CL 106 04/15/2013 1554   CO2 26 04/15/2013 1554   GLUCOSE 69* 04/15/2013 1554   BUN 14 04/15/2013 1554   CREATININE 0.6 04/15/2013 1554   CALCIUM 9.2 04/15/2013 1554    Lipid Panel     Component Value Date/Time   CHOL 199 04/15/2013 1554   TRIG 192.0* 04/15/2013 1554   HDL 37.10* 04/15/2013 1554   CHOLHDL 5 04/15/2013 1554   VLDL 38.4 04/15/2013 1554   LDLCALC 124* 04/15/2013 1554    CBC    Component Value Date/Time   WBC 9.7 04/15/2013 1554   RBC 4.83 04/15/2013 1554   HGB 13.5 04/15/2013 1554   HCT 41.9 04/15/2013 1554   PLT 332.0 04/15/2013 1554   MCV 86.8 04/15/2013 1554   MCHC 32.3 04/15/2013 1554   RDW 14.2 04/15/2013 1554    Hgb A1C No results found for this basename: HGBA1C         Assessment & Plan:   Bacterial Vaginosis:  Per pt, resolving with flagyl Advised her not to douche She is very concerned about cervical cancer- advised her to make follow up appt with me and will do her pap smear.  Toenail fungus:  Discussed different treatment options She does not want oral treatment at this time Will try Lamisil OTC  Nausea, shortness of breath, fatigue, dizziness, chest tightness:  ? GERD vs anxiety Will check CBC, CMET and B12 Will try prilosec OTC Advised her to stop using Web MD  If no improvement in 2-3 weeks, will reasses  RTC as needed or if symptoms persist or worsen

## 2013-11-17 NOTE — Patient Instructions (Addendum)
Ringworm, Nail A fungal infection of the nail (tinea unguium/onychomycosis) is common. It is common as the visible part of the nail is composed of dead cells which have no blood supply to help prevent infection. It occurs because fungi are everywhere and will pick any opportunity to grow on any dead material. Because nails are very slow growing they require up to 2 years of treatment with anti-fungal medications. The entire nail back to the base is infected. This includes approximately  of the nail which you cannot see. If your caregiver has prescribed a medication by mouth, take it every day and as directed. No progress will be seen for at least 6 to 9 months. Do not be disappointed! Because fungi live on dead cells with little or no exposure to blood supply, medication delivery to the infection is slow; thus the cure is slow. It is also why you can observe no progress in the first 6 months. The nail becoming cured is the base of the nail, as it has the blood supply. Topical medication such as creams and ointments are usually not effective. Important in successful treatment of nail fungus is closely following the medication regimen that your doctor prescribes. Sometimes you and your caregiver may elect to speed up this process by surgical removal of all the nails. Even this may still require 6 to 9 months of additional oral medications. See your caregiver as directed. Remember there will be no visible improvement for at least 6 months. See your caregiver sooner if other signs of infection (redness and swelling) develop. Document Released: 02/24/2000 Document Revised: 05/21/2011 Document Reviewed: 05/04/2008 [employer] Patient Information 2015 [employer]. This information is not intended to replace advice given to you by your health care provider. Make sure you discuss any questions you have with your health care provider.  

## 2013-11-17 NOTE — Progress Notes (Signed)
Pre visit review using our clinic review tool, if applicable. No additional management support is needed unless otherwise documented below in the visit note. 

## 2013-11-18 ENCOUNTER — Encounter: Payer: Self-pay | Admitting: Internal Medicine

## 2013-11-18 ENCOUNTER — Ambulatory Visit (INDEPENDENT_AMBULATORY_CARE_PROVIDER_SITE_OTHER): Payer: BC Managed Care – PPO | Admitting: Internal Medicine

## 2013-11-18 ENCOUNTER — Other Ambulatory Visit (HOSPITAL_COMMUNITY)
Admission: RE | Admit: 2013-11-18 | Discharge: 2013-11-18 | Disposition: A | Discharge status: Home / Self Care | Payer: BC Managed Care – PPO | Source: Ambulatory Visit | Attending: Internal Medicine | Admitting: Internal Medicine

## 2013-11-18 VITALS — BP 106/50 | HR 76 | Temp 98.7°F | Ht 64.0 in | Wt 179.0 lb

## 2013-11-18 DIAGNOSIS — IMO0002 Reserved for concepts with insufficient information to code with codable children: Secondary | ICD-10-CM

## 2013-11-18 DIAGNOSIS — Z01419 Encounter for gynecological examination (general) (routine) without abnormal findings: Secondary | ICD-10-CM | POA: Insufficient documentation

## 2013-11-18 DIAGNOSIS — Z124 Encounter for screening for malignant neoplasm of cervix: Secondary | ICD-10-CM

## 2013-11-18 DIAGNOSIS — Z1151 Encounter for screening for human papillomavirus (HPV): Secondary | ICD-10-CM | POA: Insufficient documentation

## 2013-11-18 LAB — VITAMIN B12: VITAMIN B 12: 313 pg/mL (ref 211–911)

## 2013-11-18 NOTE — Progress Notes (Signed)
Subjective:    Patient ID: Cassandra [name], female    DOB: 1980/08/29, 33 y.o.   MRN: [id number]  HPI  Pt presents to the clinic today for her pap smear. She has been having some recent vaginal discharge. Was diagnosed with BV and currently on Flagyl with some improvement. Has had pain with intercourse since having her IUD placed 2 years ago. Regular menstrual periods with lighter flow since IUD placement.  Review of Systems      Past Medical History  Diagnosis Date  . Chicken pox   . Frequent headaches   . Arrhythmia   . H/O hematuria   . Nocturnal headaches   . Heart palpitations     Current Outpatient Prescriptions  Medication Sig Dispense Refill  . ibuprofen (ADVIL,MOTRIN) 200 MG tablet Take 200 mg by mouth as needed.      . metroNIDAZOLE (FLAGYL) 500 MG tablet Take 1 tablet (500 mg total) by mouth 2 (two) times daily. Do not drink alcohol with this medication.  14 tablet  0   No current facility-administered medications for this visit.    No Known Allergies  Family History  Problem Relation Age of Onset  . Hyperlipidemia Mother   . Hypertension Mother   . Drug abuse Father   . Arthritis Maternal Grandmother   . Hyperlipidemia Maternal Grandmother   . Heart disease Maternal Grandmother   . Hypertension Maternal Grandmother   . Diabetes Maternal Grandmother   . Heart disease Maternal Grandfather     History   Social History  . Marital Status: Married    Spouse Name: N/A    Number of Children: N/A  . Years of Education: N/A   Occupational History  . Not on file.   Social History Main Topics  . Smoking status: Never Smoker   . Smokeless tobacco: Not on file  . Alcohol Use: Yes     Comment: occasional  . Drug Use: No  . Sexual Activity: Yes    Birth Control/ Protection: IUD   Other Topics Concern  . Not on file   Social History Narrative  . No narrative on file     Constitutional: Denies fever, malaise, fatigue, headache or abrupt weight  changes.  Gastrointestinal: Denies abdominal pain, bloating, constipation, diarrhea or blood in the stool.  GU: Pt reports pain with intercourse. Denies urgency, frequency, pain with urination, burning sensation, blood in urine, odor or discharge.   No other specific complaints in a complete review of systems (except as listed in HPI above).  Objective:   Physical Exam BP 106/50  Pulse 76  Temp(Src) 98.7 F (37.1 C) (Oral)  Ht 5\' 4"  (1.626 m)  Wt 179 lb (81.194 kg)  BMI 30.71 kg/m2  SpO2 98%  LMP 10/27/2013   Constitutional:  Alert, oriented x 4, well developed, well nourished in no apparent distress. Cardiovascular: Normal rate and rhythm. S1,S2 noted.  No murmur, rubs or gallops noted.  Pulmonary/Chest: Normal effort and positive vesicular breath sounds. No respiratory distress. No wheezes, rales or ronchi noted.  Abdomen: Soft and nontender. Normal bowel sounds, no bruits noted. No distention or masses noted. Liver, spleen and kidneys non palpable. Genitourinary: Normal female anatomy. Uterus tilted but and soft. No CMT or discharge noted.IUD strings noted.  Adenexa palpable on the left.        Assessment & Plan:   Screening for cervical cancer:  Pap smear obtained today Will call you with the results If pap normal, we can consider  ultrasound to check IUD  RTC as needed

## 2013-11-18 NOTE — Patient Instructions (Addendum)
Pap Test A Pap test checks the cells on the surface of your cervix. Your doctor will look for cell changes that are not normal, an infection, or cancer. If the cells no longer look normal, it is called dysplasia. Dysplasia can turn into cancer. Regular Pap tests are important to stop cancer from developing. BEFORE THE PROCEDURE  Ask your doctor when to schedule your Pap test. Timing the test around your period may be important.  Do not douche or have sex (intercourse) for 24 hours before the test.  Do not put creams on your vagina or use tampons for 24 hours before the test.  Go pee (urinate) just before the test. PROCEDURE  You will lie on an exam table with your feet in stirrups.  A warm metal or plastic tool (speculum) will be put in your vagina to open it up.  Your doctor will use a small, plastic brush or wooden spatula to take cells from your cervix.  The cells will be put in a lab container.  The cells will be checked under a microscope to see if they are normal or not. AFTER THE PROCEDURE Get your test results. If they are abnormal, you may need more tests. Document Released: 03/31/2010 Document Revised: 05/21/2011 Document Reviewed: 02/22/2011 [employer] Patient Information 2015 [employer]. This information is not intended to replace advice given to you by your health care provider. Make sure you discuss any questions you have with your health care provider.  

## 2013-11-23 LAB — CYTOLOGY - PAP

## 2014-01-11 ENCOUNTER — Ambulatory Visit: Payer: Self-pay | Admitting: Internal Medicine

## 2014-01-11 ENCOUNTER — Ambulatory Visit (INDEPENDENT_AMBULATORY_CARE_PROVIDER_SITE_OTHER): Payer: Self-pay | Admitting: Internal Medicine

## 2014-01-11 ENCOUNTER — Encounter: Payer: Self-pay | Admitting: Internal Medicine

## 2014-01-11 ENCOUNTER — Telehealth: Payer: Self-pay | Admitting: Internal Medicine

## 2014-01-11 VITALS — BP 118/64 | HR 76 | Temp 98.0°F | Wt 179.0 lb

## 2014-01-11 DIAGNOSIS — R0789 Other chest pain: Secondary | ICD-10-CM

## 2014-01-11 NOTE — Telephone Encounter (Signed)
Patient Information:  Caller Name: Cassandra [name] [name]  Phone: [phone]  Patient: Cassandra [name], [name]  Gender: Female  DOB: 10/10/1980  Age: 33 Years  PCP: Webb Silversmith  Pregnant: No  Office Follow Up:  Does the office need to follow up with this patient?: No  Instructions For The Office: N/A   Symptoms  Reason For Call & Symptoms: Pt reports she has been evaluated for chest discomfort Rx Prilosec and did not take medication because pain stopped.  Pt reports she is having different chest discomfort in the breast and radiates under the arm, with numbness, tingling in that arm.  Pt reports pain is not always in the same breast or hust one breast.  Reviewed Health History In EMR: Yes  Reviewed Medications In EMR: Yes  Reviewed Allergies In EMR: Yes  Reviewed Surgeries / Procedures: Yes  Date of Onset of Symptoms: 01/09/2014 OB / GYN:  LMP: 12/27/2013  Guideline(s) Used:  Chest Pain  Disposition Per Guideline:   See Today in Office  Reason For Disposition Reached:   All other patients with chest pain  Advice Given:  N/A  Patient Will Follow Care Advice:  YES  Appointment Scheduled:  01/11/2014 16:15:00 Appointment Scheduled Provider:  Webb Silversmith

## 2014-01-11 NOTE — Progress Notes (Signed)
Pre visit review using our clinic review tool, if applicable. No additional management support is needed unless otherwise documented below in the visit note. 

## 2014-01-11 NOTE — Patient Instructions (Signed)

## 2014-01-11 NOTE — Progress Notes (Signed)
Subjective:    Patient ID: Cassandra [name], female    DOB: Nov 13, 1980, 33 y.o.   MRN: [id number]  HPI  Pt presents to the clinic today with c/o pain in her upper chest. She reports this started about 1-2 weeks ago. She reports that the area feels tight and tense. She denies any injury to the area. She does reports some tingling down her arms. She denies chest pain or shortness of breath. She reports this pain is different from her reflux.  Review of Systems      Past Medical History  Diagnosis Date  . Chicken pox   . Frequent headaches   . Arrhythmia   . H/O hematuria   . Nocturnal headaches   . Heart palpitations     Current Outpatient Prescriptions  Medication Sig Dispense Refill  . ibuprofen (ADVIL,MOTRIN) 200 MG tablet Take 200 mg by mouth as needed.     No current facility-administered medications for this visit.    No Known Allergies  Family History  Problem Relation Age of Onset  . Hyperlipidemia Mother   . Hypertension Mother   . Drug abuse Father   . Arthritis Maternal Grandmother   . Hyperlipidemia Maternal Grandmother   . Heart disease Maternal Grandmother   . Hypertension Maternal Grandmother   . Diabetes Maternal Grandmother   . Heart disease Maternal Grandfather     History   Social History  . Marital Status: Married    Spouse Name: N/A    Number of Children: N/A  . Years of Education: N/A   Occupational History  . Not on file.   Social History Main Topics  . Smoking status: Never Smoker   . Smokeless tobacco: Not on file  . Alcohol Use: Yes     Comment: occasional  . Drug Use: No  . Sexual Activity: Yes    Birth Control/ Protection: IUD   Other Topics Concern  . Not on file   Social History Narrative     Constitutional: Denies fever, malaise, fatigue, headache or abrupt weight changes.  Respiratory: Denies difficulty breathing, shortness of breath, cough or sputum production.   Cardiovascular: Denies chest pain, chest tightness,  palpitations or swelling in the hands or feet.  Gastrointestinal: Denies abdominal pain, bloating, constipation, diarrhea or blood in the stool.  Musculoskeletal: Pt reports chest wall pain. Denies decrease in range of motion, difficulty with gait,  or joint pain and swelling.    No other specific complaints in a complete review of systems (except as listed in HPI above).  Objective:   Physical Exam  BP 118/64 mmHg  Pulse 76  Temp(Src) 98 F (36.7 C) (Oral)  Wt 179 lb (81.194 kg)  SpO2 99%  LMP 12/27/2013 Wt Readings from Last 3 Encounters:  01/11/14 179 lb (81.194 kg)  11/18/13 179 lb (81.194 kg)  11/17/13 179 lb (81.194 kg)    General: Appears her stated age, well developed, well nourished in NAD.  Cardiovascular: Normal rate and rhythm. S1,S2 noted.  No murmur, rubs or gallops noted. No JVD or BLE edema. No carotid bruits noted. Pulmonary/Chest: Normal effort and positive vesicular breath sounds. Musculoskeletal: Tension noted in the parasternal muscles L>R. Ribs intact. Psychiatric: Mood extremely anxious and affect normal. Behavior is normal. Judgment and thought content normal.     BMET    Component Value Date/Time   NA 138 11/17/2013 1425   K 3.8 11/17/2013 1425   CL 107 11/17/2013 1425   CO2 23 11/17/2013 1425  GLUCOSE 93 11/17/2013 1425   BUN 11 11/17/2013 1425   CREATININE 0.6 11/17/2013 1425   CALCIUM 9.1 11/17/2013 1425    Lipid Panel     Component Value Date/Time   CHOL 199 04/15/2013 1554   TRIG 192.0* 04/15/2013 1554   HDL 37.10* 04/15/2013 1554   CHOLHDL 5 04/15/2013 1554   VLDL 38.4 04/15/2013 1554   LDLCALC 124* 04/15/2013 1554    CBC    Component Value Date/Time   WBC 8.9 11/17/2013 1425   RBC 4.63 11/17/2013 1425   HGB 13.5 11/17/2013 1425   HCT 39.8 11/17/2013 1425   PLT 305.0 11/17/2013 1425   MCV 86.0 11/17/2013 1425   MCHC 33.8 11/17/2013 1425   RDW 12.9 11/17/2013 1425    Hgb A1C No results found for: HGBA1C         Assessment & Plan:   Chest wall pain:  MSK related She is very anxious though- she wants a mammogram and a breast exam and chest xray Advised her I do not think she needs all this She should try 600 mg Ibuprofen TID prn Stretching exercises given  RTC as needed or if symptoms persist or worsen

## 2014-01-12 ENCOUNTER — Ambulatory Visit: Payer: Self-pay | Admitting: Internal Medicine

## 2014-03-06 ENCOUNTER — Emergency Department (HOSPITAL_COMMUNITY)
Admission: EM | Admit: 2014-03-06 | Discharge: 2014-03-06 | Disposition: A | Discharge status: Home / Self Care | Payer: Self-pay | Attending: Emergency Medicine | Admitting: Emergency Medicine

## 2014-03-06 ENCOUNTER — Encounter (HOSPITAL_COMMUNITY): Payer: Self-pay | Admitting: Emergency Medicine

## 2014-03-06 DIAGNOSIS — N390 Urinary tract infection, site not specified: Secondary | ICD-10-CM | POA: Insufficient documentation

## 2014-03-06 DIAGNOSIS — R300 Dysuria: Secondary | ICD-10-CM

## 2014-03-06 DIAGNOSIS — Z792 Long term (current) use of antibiotics: Secondary | ICD-10-CM | POA: Insufficient documentation

## 2014-03-06 DIAGNOSIS — Z8679 Personal history of other diseases of the circulatory system: Secondary | ICD-10-CM | POA: Insufficient documentation

## 2014-03-06 DIAGNOSIS — Z8619 Personal history of other infectious and parasitic diseases: Secondary | ICD-10-CM | POA: Insufficient documentation

## 2014-03-06 DIAGNOSIS — Z79899 Other long term (current) drug therapy: Secondary | ICD-10-CM | POA: Insufficient documentation

## 2014-03-06 LAB — URINALYSIS, ROUTINE W REFLEX MICROSCOPIC
Bilirubin Urine: NEGATIVE
Glucose, UA: NEGATIVE mg/dL
Ketones, ur: NEGATIVE mg/dL
Nitrite: POSITIVE — AB
Protein, ur: 100 mg/dL — AB
Specific Gravity, Urine: 1.02 (ref 1.005–1.030)
Urobilinogen, UA: 0.2 mg/dL (ref 0.0–1.0)
pH: 5.5 (ref 5.0–8.0)

## 2014-03-06 LAB — URINE MICROSCOPIC-ADD ON

## 2014-03-06 MED ORDER — CEPHALEXIN 500 MG PO CAPS
500.0000 mg | ORAL_CAPSULE | Freq: Once | ORAL | Status: AC
  Administered 2014-03-06: 500 mg via ORAL
  Filled 2014-03-06: qty 1

## 2014-03-06 MED ORDER — CEPHALEXIN 500 MG PO CAPS: 500.0000 mg | ORAL_CAPSULE | Freq: Four times a day (QID) | ORAL | Status: DC

## 2014-03-06 MED ORDER — PHENAZOPYRIDINE HCL 95 MG PO TABS: 95.0000 mg | ORAL_TABLET | Freq: Three times a day (TID) | ORAL | Status: DC | PRN

## 2014-03-06 NOTE — ED Provider Notes (Signed)
CSN: [id number]     Arrival date & time 03/06/14  1905 History   First MD Initiated Contact with Patient 03/06/14 2127     Chief Complaint  Patient presents with  . Dysuria     (Consider location/radiation/quality/duration/timing/severity/associated sxs/prior Treatment) Patient is a 33 y.o. female presenting with dysuria. The history is provided by the patient and medical records. No language interpreter was used.  Dysuria Associated symptoms: no abdominal pain, no fever, no nausea and no vomiting      Cassandra [name] is a 32 y.o. female  with no major medical problems presents to the Emergency Department complaining of gradual, persistent, progressively worsening dysuria with associated frequency, urgency and hematuria onset 2 days ago. This reports taking ibuprofen without relief. No other treatments prior to arrival. No aggravating or alleviating factors. Patient denies fever, chills, headache, neck pain, chest pain, shortness of breath, abdominal pain, flank pain, nausea, vomiting, diarrhea, weakness, dizziness, syncope.  Past Medical History  Diagnosis Date  . Chicken pox   . Frequent headaches   . Arrhythmia   . H/O hematuria   . Nocturnal headaches   . Heart palpitations    History reviewed. No pertinent past surgical history. Family History  Problem Relation Age of Onset  . Hyperlipidemia Mother   . Hypertension Mother   . Drug abuse Father   . Arthritis Maternal Grandmother   . Hyperlipidemia Maternal Grandmother   . Heart disease Maternal Grandmother   . Hypertension Maternal Grandmother   . Diabetes Maternal Grandmother   . Heart disease Maternal Grandfather    History  Substance Use Topics  . Smoking status: Never Smoker   . Smokeless tobacco: Not on file  . Alcohol Use: Yes     Comment: occasional   OB History    No data available     Review of Systems  Constitutional: Negative for fever, diaphoresis, appetite change, fatigue and unexpected weight  change.  HENT: Negative for mouth sores.   Eyes: Negative for visual disturbance.  Respiratory: Negative for cough, chest tightness, shortness of breath and wheezing.   Cardiovascular: Negative for chest pain.  Gastrointestinal: Negative for nausea, vomiting, abdominal pain, diarrhea and constipation.  Endocrine: Negative for polydipsia, polyphagia and polyuria.  Genitourinary: Positive for dysuria and hematuria. Negative for urgency and frequency.  Musculoskeletal: Negative for back pain and neck stiffness.  Skin: Negative for rash.  Allergic/Immunologic: Negative for immunocompromised state.  Neurological: Negative for syncope, light-headedness and headaches.  Hematological: Does not bruise/bleed easily.  Psychiatric/Behavioral: Negative for sleep disturbance. The patient is not nervous/anxious.       Allergies  Review of patient's allergies indicates no known allergies.  Home Medications   Prior to Admission medications   Medication Sig Start Date End Date Taking? Authorizing Provider  ibuprofen (ADVIL,MOTRIN) 200 MG tablet Take 400 mg by mouth as needed.    Yes Historical Provider, MD  cephALEXin (KEFLEX) 500 MG capsule Take 1 capsule (500 mg total) by mouth 4 (four) times daily. 03/06/14   Nissim Fleischer, PA-C  phenazopyridine (PYRIDIUM) 95 MG tablet Take 1 tablet (95 mg total) by mouth 3 (three) times daily as needed for pain. 03/06/14   Miles Leyda, PA-C   BP 121/66 mmHg  Pulse 80  Temp(Src) 98.2 F (36.8 C) (Oral)  Resp 18  Wt 177 lb (80.287 kg)  SpO2 98%  LMP 02/18/2014 Physical Exam  Constitutional: She appears well-developed and well-nourished. No distress.  Awake, alert, nontoxic appearance  HENT:  Head: Normocephalic  and atraumatic.  Mouth/Throat: Oropharynx is clear and moist. No oropharyngeal exudate.  Eyes: Conjunctivae are normal. No scleral icterus.  Neck: Normal range of motion. Neck supple.  Cardiovascular: Normal rate, regular rhythm,  normal heart sounds and intact distal pulses.   No murmur heard. Pulmonary/Chest: Effort normal and breath sounds normal. No respiratory distress. She has no wheezes.  Equal chest expansion  Abdominal: Soft. Bowel sounds are normal. She exhibits no mass. There is no tenderness. There is no rebound, no guarding and no CVA tenderness.  Abdomen soft and nontender No CVA tenderness  Musculoskeletal: Normal range of motion. She exhibits no edema.  Neurological: She is alert. She exhibits normal muscle tone. Coordination normal.  Speech is clear and goal oriented Moves extremities without ataxia  Skin: Skin is warm and dry. She is not diaphoretic. No erythema.  Psychiatric: She has a normal mood and affect.  Nursing note and vitals reviewed.   ED Course  Procedures (including critical care time) Labs Review Labs Reviewed  URINALYSIS, ROUTINE W REFLEX MICROSCOPIC - Abnormal; Notable for the following:    APPearance CLOUDY (*)    Hgb urine dipstick LARGE (*)    Protein, ur 100 (*)    Nitrite POSITIVE (*)    Leukocytes, UA LARGE (*)    All other components within normal limits  URINE MICROSCOPIC-ADD ON - Abnormal; Notable for the following:    Squamous Epithelial / LPF MANY (*)    Bacteria, UA MANY (*)    All other components within normal limits    Imaging Review No results found.   EKG Interpretation None      MDM   Final diagnoses:  UTI (lower urinary tract infection)  Dysuria   Cassandra [name] presents with dysuria, hematuria and urgency.  Pt has been diagnosed with a UTI. Pt is afebrile, no CVA tenderness, normotensive, and denies N/V. Pt to be dc home with antibiotics and instructions to follow up with PCP if symptoms persist.   I have personally reviewed patient's vitals, nursing note and any pertinent labs or imaging.  I performed an focused physical exam; undressed when appropriate .    It has been determined that no acute conditions requiring further emergency  intervention are present at this time. The patient/guardian have been advised of the diagnosis and plan. I reviewed any labs and imaging including any potential incidental findings. We have discussed signs and symptoms that warrant return to the ED and they are listed in the discharge instructions.    Vital signs are stable at discharge.   BP 121/66 mmHg  Pulse 80  Temp(Src) 98.2 F (36.8 C) (Oral)  Resp 18  Wt 177 lb (80.287 kg)  SpO2 98%  LMP 02/18/2014         Jarrett Soho Kishaun Erekson, PA-C 03/06/14 1696  Virgel Manifold, MD 03/08/14 [phone]

## 2014-03-06 NOTE — Discharge Instructions (Signed)
1. Medications: Keflex, pyridium, usual home medications 2. Treatment: rest, drink plenty of fluids, take medications as prescribed 3. Follow Up: Please followup with your primary doctor in 3 days for discussion of your diagnoses and further evaluation after today's visit; if you do not have a primary care doctor use the resource guide provided to find one; return to the ER for fevers, persistent vomiting, worsening abdominal pain or other concerning symptoms.   Urinary Tract Infection Urinary tract infections (UTIs) can develop anywhere along your urinary tract. Your urinary tract is your body's drainage system for removing wastes and extra water. Your urinary tract includes two kidneys, two ureters, a bladder, and a urethra. Your kidneys are a pair of bean-shaped organs. Each kidney is about the size of your fist. They are located below your ribs, one on each side of your spine. CAUSES Infections are caused by microbes, which are microscopic organisms, including fungi, viruses, and bacteria. These organisms are so small that they can only be seen through a microscope. Bacteria are the microbes that most commonly cause UTIs. SYMPTOMS  Symptoms of UTIs may vary by age and gender of the patient and by the location of the infection. Symptoms in young women typically include a frequent and intense urge to urinate and a painful, burning feeling in the bladder or urethra during urination. Older women and men are more likely to be tired, shaky, and weak and have muscle aches and abdominal pain. A fever may mean the infection is in your kidneys. Other symptoms of a kidney infection include pain in your back or sides below the ribs, nausea, and vomiting. DIAGNOSIS To diagnose a UTI, your caregiver will ask you about your symptoms. Your caregiver also will ask to provide a urine sample. The urine sample will be tested for bacteria and white blood cells. White blood cells are made by your body to help fight  infection. TREATMENT  Typically, UTIs can be treated with medication. Because most UTIs are caused by a bacterial infection, they usually can be treated with the use of antibiotics. The choice of antibiotic and length of treatment depend on your symptoms and the type of bacteria causing your infection. HOME CARE INSTRUCTIONS  If you were prescribed antibiotics, take them exactly as your caregiver instructs you. Finish the medication even if you feel better after you have only taken some of the medication.  Drink enough water and fluids to keep your urine clear or pale yellow.  Avoid caffeine, tea, and carbonated beverages. They tend to irritate your bladder.  Empty your bladder often. Avoid holding urine for long periods of time.  Empty your bladder before and after sexual intercourse.  After a bowel movement, women should cleanse from front to back. Use each tissue only once. SEEK MEDICAL CARE IF:   You have back pain.  You develop a fever.  Your symptoms do not begin to resolve within 3 days. SEEK IMMEDIATE MEDICAL CARE IF:   You have severe back pain or lower abdominal pain.  You develop chills.  You have nausea or vomiting.  You have continued burning or discomfort with urination. MAKE SURE YOU:   Understand these instructions.  Will watch your condition.  Will get help right away if you are not doing well or get worse. Document Released: 12/06/2004 Document Revised: 08/28/2011 Document Reviewed: 04/06/2011 [facility] Patient Information 2015 [place], [place]. This information is not intended to replace advice given to you by your health care provider. Make sure you discuss any  questions you have with your health care provider.

## 2014-03-06 NOTE — ED Notes (Signed)
Pt arrived to the ED with a compliant of a possible bladder infection.  Pt states she has had painful burning urination for a day.  Today patient saw blood on the tissue when she wiped.

## 2014-03-30 ENCOUNTER — Encounter: Payer: Self-pay | Admitting: Family Medicine

## 2014-03-30 ENCOUNTER — Ambulatory Visit (INDEPENDENT_AMBULATORY_CARE_PROVIDER_SITE_OTHER): Payer: BLUE CROSS/BLUE SHIELD | Admitting: Family Medicine

## 2014-03-30 VITALS — BP 118/82 | HR 82 | Temp 98.2°F | Wt 177.0 lb

## 2014-03-30 DIAGNOSIS — N644 Mastodynia: Secondary | ICD-10-CM

## 2014-03-30 NOTE — Progress Notes (Signed)
Pre visit review using our clinic review tool, if applicable. No additional management support is needed unless otherwise documented below in the visit note.  Prev had pain in L axilla, then at the R axilla, both resolved initially.   Now with B breast tenderness and "heaviness" like when she was pregnant.   Not pregnant, has IUD with recent period, 2 weeks ago.  She thought she felt a lump on the R breast.   Sx started about 1.5 weeks ago.   She has h/o sporadic nipple pain over the years.    She drank more caffeine in the last few weeks.  She tried taking ibuprofen once today with some temporary relief.  No nipple discharge.   She has extra tissue beside the L nipple, present for years.    No FH breast cancer.   She doesn't feel unwell or sick o/w.    Meds, vitals, and allergies reviewed.   ROS: See HPI.  Otherwise, noncontributory.  nad ncat Breast exam: No specific mass, thickening, bulging, retraction, inflamation, nipple discharge or skin changes noted.  She does have likely B diffusely cystic breasts but no specific lump on exam. No axillary or clavicular LA.  Chaperoned exam.

## 2014-03-30 NOTE — Patient Instructions (Signed)
Rosaria Ferries will call about your referral. Take care. Taper off caffeine.

## 2014-03-31 ENCOUNTER — Other Ambulatory Visit: Payer: Self-pay | Admitting: Family Medicine

## 2014-03-31 DIAGNOSIS — R079 Chest pain, unspecified: Secondary | ICD-10-CM | POA: Insufficient documentation

## 2014-03-31 DIAGNOSIS — N644 Mastodynia: Secondary | ICD-10-CM

## 2014-03-31 NOTE — Assessment & Plan Note (Addendum)
Likely from combination of fibrocystic changes and caffeine intake.  Taper caffeine, check mammogram, f/u prn. She agrees.

## 2014-04-08 ENCOUNTER — Ambulatory Visit
Admission: RE | Admit: 2014-04-08 | Discharge: 2014-04-08 | Disposition: A | Discharge status: Home / Self Care | Payer: BLUE CROSS/BLUE SHIELD | Source: Ambulatory Visit | Attending: Family Medicine | Admitting: Family Medicine

## 2014-04-08 ENCOUNTER — Encounter: Payer: Self-pay | Admitting: *Deleted

## 2014-04-08 DIAGNOSIS — N644 Mastodynia: Secondary | ICD-10-CM

## 2014-08-19 ENCOUNTER — Encounter: Payer: Self-pay | Admitting: Internal Medicine

## 2014-08-19 ENCOUNTER — Other Ambulatory Visit: Payer: Self-pay | Admitting: Internal Medicine

## 2014-08-19 ENCOUNTER — Ambulatory Visit (INDEPENDENT_AMBULATORY_CARE_PROVIDER_SITE_OTHER): Payer: BLUE CROSS/BLUE SHIELD | Admitting: Internal Medicine

## 2014-08-19 VITALS — BP 116/70 | HR 76 | Temp 98.8°F | Wt 167.0 lb

## 2014-08-19 DIAGNOSIS — R519 Headache, unspecified: Secondary | ICD-10-CM

## 2014-08-19 DIAGNOSIS — R634 Abnormal weight loss: Secondary | ICD-10-CM

## 2014-08-19 DIAGNOSIS — R51 Headache: Secondary | ICD-10-CM

## 2014-08-19 DIAGNOSIS — R7989 Other specified abnormal findings of blood chemistry: Secondary | ICD-10-CM

## 2014-08-19 DIAGNOSIS — J029 Acute pharyngitis, unspecified: Secondary | ICD-10-CM

## 2014-08-19 LAB — POCT RAPID STREP A (OFFICE): Rapid Strep A Screen: NEGATIVE

## 2014-08-19 LAB — TESTOSTERONE: Testosterone: 44.17 ng/dL — ABNORMAL HIGH (ref 15.00–40.00)

## 2014-08-19 NOTE — Addendum Note (Signed)
Addended by: Jearld Fenton on: 08/19/2014 01:43 PM   Modules accepted: Miquel Dunn

## 2014-08-19 NOTE — Progress Notes (Signed)
Pre visit review using our clinic review tool, if applicable. No additional management support is needed unless otherwise documented below in the visit note. 

## 2014-08-19 NOTE — Addendum Note (Signed)
Addended by: Lurlean Nanny on: 08/19/2014 02:43 PM   Modules accepted: Orders

## 2014-08-19 NOTE — Addendum Note (Signed)
Addended by: Jearld Fenton on: 08/19/2014 01:42 PM   Modules accepted: Miquel Dunn

## 2014-08-19 NOTE — Progress Notes (Signed)
Subjective:    Patient ID: Cassandra [name], female    DOB: Sep 29, 1980, 34 y.o.   MRN: [id number]  HPI  Pt presents to the clinic today with c/o a headache and sore throat. This started yesterday. She denies fever, chills or body aches. She has not tried anything OTC. She report her symptoms have improved but she is concerned because she reports her kids were diagnosed with strep yesterday and are currently being treated with antibiotics.  Additionally, she is concerned about ongoing loss of weight and hair loss. She did see her dermatologist for hair loss and reports they did a scalp biopsy and told her that she is producing too much testosterone. She does have bald spots on he top of her head. She has also lost 17 lbs in the last year. Her appetite is not like it used to be and she does feel anxious a lot. She is concerned that she is losing weight without trying.  Review of Systems      Past Medical History  Diagnosis Date  . Chicken pox   . Frequent headaches   . Arrhythmia   . H/O hematuria   . Nocturnal headaches   . Heart palpitations     Current Outpatient Prescriptions  Medication Sig Dispense Refill  . ibuprofen (ADVIL,MOTRIN) 200 MG tablet Take 400 mg by mouth as needed.     Marland Kitchen levonorgestrel (MIRENA) 20 MCG/24HR IUD 1 each by Intrauterine route once.     No current facility-administered medications for this visit.    No Known Allergies  Family History  Problem Relation Age of Onset  . Hyperlipidemia Mother   . Hypertension Mother   . Drug abuse Father   . Arthritis Maternal Grandmother   . Hyperlipidemia Maternal Grandmother   . Heart disease Maternal Grandmother   . Hypertension Maternal Grandmother   . Diabetes Maternal Grandmother   . Heart disease Maternal Grandfather     History   Social History  . Marital Status: Married    Spouse Name: N/A  . Number of Children: N/A  . Years of Education: N/A   Occupational History  . Not on file.   Social  History Main Topics  . Smoking status: Never Smoker   . Smokeless tobacco: Not on file  . Alcohol Use: Yes     Comment: occasional  . Drug Use: No  . Sexual Activity: Yes    Birth Control/ Protection: IUD   Other Topics Concern  . Not on file   Social History Narrative     Constitutional: Pt reports headache and weight loss. Denies fever, malaise, fatigue.  HEENT: Pt reports hair loss, sore throat. Denies eye pain, eye redness, ear pain, ringing in the ears, wax buildup, runny nose, nasal congestion, bloody nose. Respiratory: Denies difficulty breathing, shortness of breath, cough or sputum production.   Cardiovascular: Denies chest pain, chest tightness, palpitations or swelling in the hands or feet.  Gastrointestinal: Denies abdominal pain, bloating, constipation, diarrhea or blood in the stool.  GU: Denies urgency, frequency, pain with urination, burning sensation, blood in urine, odor or discharge. tions.   No other specific complaints in a complete review of systems (except as listed in HPI above).  Objective:   Physical Exam   BP 116/70 mmHg  Pulse 76  Temp(Src) 98.8 F (37.1 C) (Oral)  Wt 167 lb (75.751 kg)  SpO2 98% Wt Readings from Last 3 Encounters:  08/19/14 167 lb (75.751 kg)  03/30/14 177 lb (80.287 kg)  03/06/14 177 lb (80.287 kg)    General: Appears her stated age, well developed, well nourished in NAD. Skin: Warm, dry and intact. Small bald spots noted on scalp. HEENT: Head: normal shape and size; Ears: Tm's gray and intact, normal light reflex; Throat/Mouth: Teeth present, mucosa pink and moist, no exudate, lesions or ulcerations noted.  Neck: No adenopathy noted. Cardiovascular: Normal rate and rhythm. S1,S2 noted.  No murmur, rubs or gallops noted. Pulmonary/Chest: Normal effort and positive vesicular breath sounds. No respiratory distress. No wheezes, rales or ronchi noted.  Abdomen: Soft and nontender. Normal bowel sounds, no bruits noted. No  distention or masses noted. Liver, spleen and kidneys non palpable.   BMET    Component Value Date/Time   NA 138 11/17/2013 1425   K 3.8 11/17/2013 1425   CL 107 11/17/2013 1425   CO2 23 11/17/2013 1425   GLUCOSE 93 11/17/2013 1425   BUN 11 11/17/2013 1425   CREATININE 0.6 11/17/2013 1425   CALCIUM 9.1 11/17/2013 1425    Lipid Panel     Component Value Date/Time   CHOL 199 04/15/2013 1554   TRIG 192.0* 04/15/2013 1554   HDL 37.10* 04/15/2013 1554   CHOLHDL 5 04/15/2013 1554   VLDL 38.4 04/15/2013 1554   LDLCALC 124* 04/15/2013 1554    CBC    Component Value Date/Time   WBC 8.9 11/17/2013 1425   RBC 4.63 11/17/2013 1425   HGB 13.5 11/17/2013 1425   HCT 39.8 11/17/2013 1425   PLT 305.0 11/17/2013 1425   MCV 86.0 11/17/2013 1425   MCHC 33.8 11/17/2013 1425   RDW 12.9 11/17/2013 1425    Hgb A1C No results found for: HGBA1C      Assessment & Plan:   Sore throat and headache:  RST: negative Ibuprofen as needed for symptoms Salt water gargles may be helpful  Loss of weight:  I think this is related to anxiety Will check serum Testosterone level today If elevated, will refer to endocrinology  RTC as needed or if symptoms persist or worsen

## 2014-08-19 NOTE — Patient Instructions (Signed)

## 2014-09-16 ENCOUNTER — Ambulatory Visit (INDEPENDENT_AMBULATORY_CARE_PROVIDER_SITE_OTHER): Payer: BLUE CROSS/BLUE SHIELD | Admitting: Internal Medicine

## 2014-09-16 ENCOUNTER — Encounter: Payer: Self-pay | Admitting: Internal Medicine

## 2014-09-16 VITALS — BP 128/70 | HR 88 | Temp 97.8°F | Wt 164.0 lb

## 2014-09-16 DIAGNOSIS — N3000 Acute cystitis without hematuria: Secondary | ICD-10-CM | POA: Diagnosis not present

## 2014-09-16 DIAGNOSIS — R300 Dysuria: Secondary | ICD-10-CM | POA: Diagnosis not present

## 2014-09-16 LAB — POCT URINALYSIS DIPSTICK
Bilirubin, UA: NEGATIVE
Glucose, UA: NEGATIVE
Nitrite, UA: NEGATIVE
Protein, UA: 1
Urobilinogen, UA: NEGATIVE
pH, UA: 7.5

## 2014-09-16 MED ORDER — SULFAMETHOXAZOLE-TRIMETHOPRIM 800-160 MG PO TABS: 1.0000 | ORAL_TABLET | Freq: Two times a day (BID) | ORAL | Status: DC

## 2014-09-16 NOTE — Progress Notes (Signed)
   Subjective:    Patient ID: Cassandra [name], female    DOB: [birth year], 34 y.o.   MRN: [id number]  HPI Here with all family for urinary symptoms  Had abdominal cramping starting about a week ago--feels like menstrual cramping but LMP 6/14 That was a normal period Now with vulvar swelling--hurts to wipe (feels like she has tampon in) Going more frequently   No fever Has nausea but no vomiting Some low back pain  Some pain with sex May note some increased urinary symptoms after coitus  Current Outpatient Prescriptions on File Prior to Visit  Medication Sig Dispense Refill  . ibuprofen (ADVIL,MOTRIN) 200 MG tablet Take 400 mg by mouth as needed.      No current facility-administered medications on file prior to visit.    No Known Allergies  Past Medical History  Diagnosis Date  . Chicken pox   . Frequent headaches   . Arrhythmia   . H/O hematuria   . Nocturnal headaches   . Heart palpitations     No past surgical history on file.  Family History  Problem Relation Age of Onset  . Hyperlipidemia Mother   . Hypertension Mother   . Drug abuse Father   . Arthritis Maternal Grandmother   . Hyperlipidemia Maternal Grandmother   . Heart disease Maternal Grandmother   . Hypertension Maternal Grandmother   . Diabetes Maternal Grandmother   . Heart disease Maternal Grandfather     History   Social History  . Marital Status: Married    Spouse Name: N/A  . Number of Children: N/A  . Years of Education: N/A   Occupational History  . Not on file.   Social History Main Topics  . Smoking status: Never Smoker   . Smokeless tobacco: Not on file  . Alcohol Use: Yes     Comment: occasional  . Drug Use: No  . Sexual Activity: Yes    Birth Control/ Protection: IUD   Other Topics Concern  . 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 There is no tenderness.  Genitourinary:  Normal introitus and urethra Bimanual shows no CMT but slight left adnexal tenderness (and slight blood--period due soon)          Assessment & Plan:

## 2014-09-16 NOTE — Assessment & Plan Note (Signed)
Abnormal urine No concerns for STD from her standpoint-- bimanual reassuring (when she noted some vulvar sensation) Will treat for 3 days if quickly better May want to try one after sex

## 2014-09-16 NOTE — Patient Instructions (Signed)
Please start the antibiotic today---take 2 doses today if you can. If your symptoms are gone by tomorrrow, you probably only need 3 days of antibiotics. You may want to try taking one antibiotic about an hour after sex.

## 2014-09-21 ENCOUNTER — Ambulatory Visit (INDEPENDENT_AMBULATORY_CARE_PROVIDER_SITE_OTHER): Payer: BLUE CROSS/BLUE SHIELD | Admitting: Endocrinology

## 2014-09-21 ENCOUNTER — Encounter: Payer: Self-pay | Admitting: Endocrinology

## 2014-09-21 VITALS — BP 116/78 | HR 75 | Temp 98.6°F | Resp 16 | Ht 63.0 in | Wt 162.0 lb

## 2014-09-21 DIAGNOSIS — R634 Abnormal weight loss: Secondary | ICD-10-CM | POA: Diagnosis not present

## 2014-09-21 LAB — BASIC METABOLIC PANEL
BUN: 13 mg/dL (ref 6–23)
CO2: 28 mEq/L (ref 19–32)
Calcium: 9.6 mg/dL (ref 8.4–10.5)
Chloride: 105 mEq/L (ref 96–112)
Creatinine, Ser: 0.68 mg/dL (ref 0.40–1.20)
GFR: 104.84 mL/min (ref >60.00)
Glucose, Bld: 102 mg/dL — ABNORMAL HIGH (ref 70–99)
Potassium: 4 mEq/L (ref 3.5–5.1)
Sodium: 138 mEq/L (ref 135–145)

## 2014-09-21 LAB — TSH: TSH: 0.78 u[IU]/mL (ref 0.35–4.50)

## 2014-09-21 NOTE — Patient Instructions (Addendum)
blood tests are requested for you today.  We'll let you know about the results. Options are birth control pills, spironolactone pills, "Vaniqa" cream, and/or minoxidil to the scalp.   Please consider and let me know.

## 2014-09-21 NOTE — Progress Notes (Signed)
Subjective:    Patient ID: Cassandra [name], female    DOB: 11-15-1980, 34 y.o.   MRN: [id number]  HPI Pt had menarche at age 24.  She has always had regular menses. She is G7P3. She does not want any more pregnancies now.  She has no h/o infertility, but has had several miscarriages.  She never taken oral contraceptives.  She has had an IUD x 2 years.  She has moderate hair loss at the crown of the head, but no assoc hirsutism.    Past Medical History  Diagnosis Date  . Chicken pox   . Frequent headaches   . Arrhythmia   . H/O hematuria   . Nocturnal headaches   . Heart palpitations     No past surgical history on file.  History   Social History  . Marital Status: Married    Spouse Name: N/A  . Number of Children: N/A  . Years of Education: N/A   Occupational History  . Not on file.   Social History Main Topics  . Smoking status: Never Smoker   . Smokeless tobacco: Not on file  . Alcohol Use: Yes     Comment: occasional  . Drug Use: No  . Sexual Activity: Yes    Birth Control/ Protection: IUD   Other Topics Concern  . Not on file   Social History Narrative    Current Outpatient Prescriptions on File Prior to Visit  Medication Sig Dispense Refill  . ibuprofen (ADVIL,MOTRIN) 200 MG tablet Take 400 mg by mouth as needed.     . sulfamethoxazole-trimethoprim (BACTRIM DS,SEPTRA DS) 800-160 MG per tablet Take 1 tablet by mouth 2 (two) times daily. 20 tablet 1   No current facility-administered medications on file prior to visit.    No Known Allergies  Family History  Problem Relation Age of Onset  . Hyperlipidemia Mother   . Hypertension Mother   . Drug abuse Father   . Arthritis Maternal Grandmother   . Hyperlipidemia Maternal Grandmother   . Heart disease Maternal Grandmother   . Hypertension Maternal Grandmother   . Diabetes Maternal Grandmother   . Heart disease Maternal Grandfather   . Other Neg Hx     pco    BP 116/78 mmHg  Pulse 75  Temp(Src)  98.6 F (37 C) (Oral)  Resp 16  Ht 5\' 3"  (1.6 m)  Wt 162 lb (73.483 kg)  BMI 28.70 kg/m2  SpO2 95%  LMP 09/18/2014    Review of Systems denies excessive diaphoresis, polyuria, cold intolerance, sob, hyperpigmentation, cramps, numbness, easy bruising, depression, and rash on the abdomen.  She has lost 40 lbs x 1 year.  She has intermittent painful intercourse and intermittent headache.      Objective:   Physical Exam VS: see vs page.  GEN: no distress. HEAD: head: no deformity.  Mild hair loss at the crown of the head.   eyes: no periorbital swelling, no proptosis external nose and ears are normal mouth: no lesion seen NECK: supple, thyroid is not enlarged CHEST WALL: no deformity LUNGS:  Clear to auscultation CV: reg rate and rhythm, no murmur ABD: abdomen is soft, nontender.  no hepatosplenomegaly.  not distended.  no hernia MUSCULOSKELETAL: muscle bulk and strength are grossly normal.  no obvious joint swelling.  gait is normal and steady EXTEMITIES: no deformity.  no ulcer on the feet.  feet are of normal color and temp.  no edema PULSES: dorsalis pedis intact bilat.  no carotid  bruit NEURO:  cn 2-12 grossly intact.   readily moves all 4's.  sensation is intact to touch on the feet SKIN:  Normal texture and temperature.  No rash or suspicious lesion is visible.   NODES:  None palpable at the neck PSYCH: alert, well-oriented.  Does not appear anxious nor depressed.  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     Patient is advised the following: Patient Instructions  blood tests are requested for you today.  We'll let you know about the results. Options are birth control pills, spironolactone pills, "Vaniqa" cream, and/or minoxidil to the scalp.     Please consider and let me know.

## 2014-10-01 ENCOUNTER — Emergency Department (HOSPITAL_COMMUNITY)
Admission: EM | Admit: 2014-10-01 | Discharge: 2014-10-02 | Disposition: A | Discharge status: Home / Self Care | Payer: BLUE CROSS/BLUE SHIELD | Attending: Emergency Medicine | Admitting: Emergency Medicine

## 2014-10-01 ENCOUNTER — Encounter (HOSPITAL_COMMUNITY): Payer: Self-pay | Admitting: Emergency Medicine

## 2014-10-01 ENCOUNTER — Emergency Department (HOSPITAL_COMMUNITY): Payer: BLUE CROSS/BLUE SHIELD

## 2014-10-01 DIAGNOSIS — R202 Paresthesia of skin: Secondary | ICD-10-CM | POA: Diagnosis not present

## 2014-10-01 DIAGNOSIS — Z8619 Personal history of other infectious and parasitic diseases: Secondary | ICD-10-CM | POA: Diagnosis not present

## 2014-10-01 DIAGNOSIS — Z87448 Personal history of other diseases of urinary system: Secondary | ICD-10-CM | POA: Insufficient documentation

## 2014-10-01 DIAGNOSIS — Z8679 Personal history of other diseases of the circulatory system: Secondary | ICD-10-CM | POA: Insufficient documentation

## 2014-10-01 DIAGNOSIS — R079 Chest pain, unspecified: Secondary | ICD-10-CM | POA: Diagnosis not present

## 2014-10-01 DIAGNOSIS — R200 Anesthesia of skin: Secondary | ICD-10-CM | POA: Insufficient documentation

## 2014-10-01 DIAGNOSIS — Z793 Long term (current) use of hormonal contraceptives: Secondary | ICD-10-CM | POA: Insufficient documentation

## 2014-10-01 NOTE — ED Notes (Signed)
Pt arrived to the ED with a complaint of chest pain.  Pain is located middle chest and radiates to the left arm.  Pt states this is a recurrent situation and she has been seen here and at her PCP.  Pt states that the pain at times makes her jaw hurt.  Pt describes pain as sharp

## 2014-10-02 LAB — CBC
HEMATOCRIT: 39.6 % (ref 36.0–46.0)
Hemoglobin: 12.9 g/dL (ref 12.0–15.0)
MCH: 28.2 pg (ref 26.0–34.0)
MCHC: 32.6 g/dL (ref 30.0–36.0)
MCV: 86.5 fL (ref 78.0–100.0)
Platelets: 298 10*3/uL (ref 150–400)
RBC: 4.58 MIL/uL (ref 3.87–5.11)
RDW: 12.6 % (ref 11.5–15.5)
WBC: 9.2 10*3/uL (ref 4.0–10.5)

## 2014-10-02 LAB — I-STAT TROPONIN, ED: Troponin i, poc: 0.01 ng/mL (ref 0.00–0.08)

## 2014-10-02 LAB — D-DIMER, QUANTITATIVE (NOT AT ARMC)

## 2014-10-02 LAB — BASIC METABOLIC PANEL
ANION GAP: 6 (ref 5–15)
BUN: 15 mg/dL (ref 6–20)
CALCIUM: 9.1 mg/dL (ref 8.9–10.3)
CO2: 29 mmol/L (ref 22–32)
Chloride: 107 mmol/L (ref 101–111)
Creatinine, Ser: 0.64 mg/dL (ref 0.44–1.00)
Glucose, Bld: 69 mg/dL (ref 65–99)
Potassium: 3.5 mmol/L (ref 3.5–5.1)
Sodium: 142 mmol/L (ref 135–145)

## 2014-10-02 NOTE — Discharge Instructions (Signed)

## 2014-10-02 NOTE — ED Provider Notes (Signed)
CSN: [id number]     Arrival date & time 10/01/14  2109 History   This chart was scribed for Ripley Fraise, MD by Forrestine Him, ED Scribe. This patient was seen in room WA05/WA05 and the patient's care was started 1:00 AM.   Chief Complaint  Patient presents with  . Chest Pain   The history is provided by the patient. No language interpreter was used.    HPI Comments: Cassandra [name] is a 34 y.o. female with a PMHx of heart palpitations who presents to the Emergency Department complaining of constant, recurrent, unchanged mid sternal chest pain along with numbness to the L arm onset 3 AM this morning 7/23. Chest pain is described as sharp. No aggravating or alleviating factors at this time. Cassandra [name] also reports intermittent L sided jaw pain. No OTC medications or home remedies attempted prior to arrival. No recent fever, chills, nausea, vomiting, abdominal pain, diarrhea, leg swelling, or shortness of breath. She is not a smoker. No history of MI or stroke. Pt just returned from [place] yesterday. No previous establishment or follow up with a cardiologist. No known allergies to medications.   Past Medical History  Diagnosis Date  . Chicken pox   . Frequent headaches   . Arrhythmia   . H/O hematuria   . Nocturnal headaches   . Heart palpitations    History reviewed. No pertinent past surgical history. Family History  Problem Relation Age of Onset  . Hyperlipidemia Mother   . Hypertension Mother   . Drug abuse Father   . Arthritis Maternal Grandmother   . Hyperlipidemia Maternal Grandmother   . Heart disease Maternal Grandmother   . Hypertension Maternal Grandmother   . Diabetes Maternal Grandmother   . Heart disease Maternal Grandfather   . Other Neg Hx     pco   History  Substance Use Topics  . Smoking status: Never Smoker   . Smokeless tobacco: Not on file  . Alcohol Use: Yes     Comment: occasional   OB History    No data available     Review of Systems   Constitutional: Negative for fever and chills.  Respiratory: Negative for cough and shortness of breath.   Cardiovascular: Positive for chest pain.  Gastrointestinal: Negative for nausea, vomiting and abdominal pain.  Musculoskeletal: Positive for arthralgias. Negative for neck pain.  Neurological: Positive for numbness. Negative for weakness.  Psychiatric/Behavioral: Negative for confusion.  All other systems reviewed and are negative.     Allergies  Review of patient's allergies indicates no known allergies.  Home Medications   Prior to Admission medications   Medication Sig Start Date End Date Taking? Authorizing Provider  levonorgestrel (MIRENA) 20 MCG/24HR IUD 1 each by Intrauterine route once.   Yes Historical Provider, MD  ibuprofen (ADVIL,MOTRIN) 200 MG tablet Take 400 mg by mouth as needed.     Historical Provider, MD  sulfamethoxazole-trimethoprim (BACTRIM DS,SEPTRA DS) 800-160 MG per tablet Take 1 tablet by mouth 2 (two) times daily. Patient not taking: Reported on 10/02/2014 09/16/14   Venia Carbon, MD   Triage Vitals: BP 123/78 mmHg  Pulse 90  Resp 14  SpO2 98%  LMP 09/18/2014   Physical Exam  CONSTITUTIONAL: Well developed/well nourished HEAD: Normocephalic/atraumatic EYES: EOMI/PERRL ENMT: Mucous membranes moist NECK: supple no meningeal signs SPINE/BACK:entire spine nontender CV: S1/S2 noted, no murmurs/rubs/gallops noted LUNGS: Lungs are clear to auscultation bilaterally, no apparent distress ABDOMEN: soft, nontender, no rebound or guarding, bowel sounds noted throughout  abdomen GU:no cva tenderness NEURO: Pt is awake/alert/appropriate, moves all extremitiesx4.  No facial droop.  Equal power (5/5) with hand grip, wrist flex/extension, elbow flex/extension, and equal power with shoulder abduction/adduction.  No focal sensory deficit to light touch is noted in either UE.   EXTREMITIES: pulses normal/equal, full ROM. No calf tenderness or edema  SKIN:  warm, color normal PSYCH: no abnormalities of mood noted, alert and oriented to situation   ED Course  Procedures  DIAGNOSTIC STUDIES: Oxygen Saturation is 98% on RA, Normal by my interpretation.    COORDINATION OF CARE: 1:06 AM- Will order CBC, i-stat troponin I, EKG, D-dimer, CXR, and BMP. Discussed treatment plan with pt at bedside and pt agreed to plan.    PT IMPROVED FOR HER LEFT HAND NUMBNESS, NO FOCAL WEAKNESS OR SENSORY DEFICIT NOTED.  I DOUBT ACUTE NEUROLOGIC EMERGENCY SHE HAS HAD THIS PREVIOUSLY IN HER HAND.  ADVISED PCP FOLLOWUP  FOR HER CP - SHE HAD CP FOR SEVERAL HOURS WITHOUT ISCHEMIC EKG CHANGES AND NEGATIVE TROPONIN . I DOUBT ACS DUE TO TRAVEL HISTORY D-DIMER ORDERED AND NEGATIVE PT IS WELL APPEARING, AMBULATORY, STABLE FOR DC HOME AT HER REQUEST SHE WAS REFERRED TO CARDIOLOGY    Labs Review Labs Reviewed  BASIC METABOLIC PANEL  CBC  D-DIMER, QUANTITATIVE (NOT AT [facility])  Randolm Idol, ED    Imaging Review Dg Chest 2 View  10/02/2014   CLINICAL DATA:  Initial valuation for acute left-sided chest pain.  EXAM: CHEST  2 VIEW  COMPARISON:  None.  FINDINGS: The cardiac and mediastinal silhouettes are within normal limits.  The lungs are normally inflated. No airspace consolidation, pleural effusion, or pulmonary edema is identified. There is no pneumothorax.  No acute osseous abnormality identified.  IMPRESSION: No active cardiopulmonary disease.   Electronically Signed   By: Jeannine Boga M.D.   On: 10/02/2014 00:56     EKG Interpretation   Date/Time:  Friday October 01 2014 21:21:45 EDT Ventricular Rate:  80 PR Interval:  157 QRS Duration: 89 QT Interval:  375 QTC Calculation: 433 R Axis:   46 Text Interpretation:  Sinus rhythm Probable left atrial enlargement RSR'  in V1 or V2, right VCD or RVH Nonspecific T abnrm, anterolateral leads  Baseline wander in lead(s) V1 V2 No old tracing to compare Confirmed by  [facility]  MD, MARTHA [phone]) on 10/01/2014  11:16:53 PM      MDM   Final diagnoses:  Chest pain, unspecified chest pain type  Paresthesia    Nursing notes including past medical history and social history reviewed and considered in documentation ,xrays/imaging reviewed by myself and considered during evaluation Labs/vital reviewed myself and considered during evaluation    I personally performed the services described in this documentation, which was scribed in my presence. The recorded information has been reviewed and is accurate.      Ripley Fraise, MD 10/02/14 [phone]

## 2014-10-06 ENCOUNTER — Telehealth: Payer: Self-pay | Admitting: Internal Medicine

## 2014-10-06 ENCOUNTER — Ambulatory Visit: Payer: BLUE CROSS/BLUE SHIELD | Admitting: Internal Medicine

## 2014-10-06 NOTE — Telephone Encounter (Signed)
No follow up needed, she can follow up with cardiology

## 2014-10-06 NOTE — Telephone Encounter (Signed)
Patient did not come in for their appointment today for ed follow up.  Please let me know if patient needs to be contacted immediately for follow up or no follow up needed.

## 2014-10-11 ENCOUNTER — Encounter: Payer: Self-pay | Admitting: Internal Medicine

## 2014-10-11 ENCOUNTER — Ambulatory Visit (INDEPENDENT_AMBULATORY_CARE_PROVIDER_SITE_OTHER): Payer: BLUE CROSS/BLUE SHIELD | Admitting: Internal Medicine

## 2014-10-11 VITALS — BP 118/78 | HR 66 | Temp 98.5°F | Wt 162.0 lb

## 2014-10-11 DIAGNOSIS — R0789 Other chest pain: Secondary | ICD-10-CM | POA: Diagnosis not present

## 2014-10-11 DIAGNOSIS — R6884 Jaw pain: Secondary | ICD-10-CM

## 2014-10-11 DIAGNOSIS — R202 Paresthesia of skin: Secondary | ICD-10-CM

## 2014-10-11 NOTE — Progress Notes (Signed)
Pre visit review using our clinic review tool, if applicable. No additional management support is needed unless otherwise documented below in the visit note. 

## 2014-10-11 NOTE — Patient Instructions (Signed)

## 2014-10-11 NOTE — Progress Notes (Signed)
Subjective:    Patient ID: Cassandra [name], female    DOB: 14-Jun-1980, 34 y.o.   MRN: [id number]  HPI  Pt presents to the clinic today for [facility] follow up. She went for acute onset chest pain that radiated into her left arm and left jaw. She did have associated numbness in her left hand as well but has experienced this before. ECG did not show any acute ischemia. D dimer was normal. Troponin's, labs and chest xray are normal. She was discharged in stable condition, and advised to follow up with her PCP. She was also referred to a cardiologist, she has an appt scheduled 11/18/14. She continues to have the pain in the left side of her chest that radiates under her left breast around to her back. She continues to have bilateral jaw pain and left hand numbness. She has also noticed right eye twitching. Her symptoms are intermittent. She does feel stressed but she feels like she handles her stress well and is not overwhelmed.  Review of Systems      Past Medical History  Diagnosis Date  . Chicken pox   . Frequent headaches   . Arrhythmia   . H/O hematuria   . Nocturnal headaches   . Heart palpitations     Current Outpatient Prescriptions  Medication Sig Dispense Refill  . ibuprofen (ADVIL,MOTRIN) 200 MG tablet Take 400 mg by mouth as needed.     Marland Kitchen levonorgestrel (MIRENA) 20 MCG/24HR IUD 1 each by Intrauterine route once.     No current facility-administered medications for this visit.    No Known Allergies  Family History  Problem Relation Age of Onset  . Hyperlipidemia Mother   . Hypertension Mother   . Drug abuse Father   . Arthritis Maternal Grandmother   . Hyperlipidemia Maternal Grandmother   . Heart disease Maternal Grandmother   . Hypertension Maternal Grandmother   . Diabetes Maternal Grandmother   . Heart disease Maternal Grandfather   . Other Neg Hx     pco    History   Social History  . Marital Status: Married    Spouse Name: N/A  . Number of Children:  N/A  . Years of Education: N/A   Occupational History  . Not on file.   Social History Main Topics  . Smoking status: Never Smoker   . Smokeless tobacco: Not on file  . Alcohol Use: Yes     Comment: occasional  . Drug Use: No  . Sexual Activity: Yes    Birth Control/ Protection: IUD   Other Topics Concern  . Not on file   Social History Narrative     Constitutional: Denies fever, malaise, fatigue, headache or abrupt weight changes.  HEENT: Pt reports eye twitching. Denies eye pain, eye redness, ear pain, ringing in the ears, wax buildup, runny nose, nasal congestion, bloody nose, or sore throat. Respiratory: Denies difficulty breathing, shortness of breath, cough or sputum production.   Cardiovascular: Pt reports chest tightness. Denies chest pain, palpitations or swelling in the hands or feet.  Gastrointestinal: Denies abdominal pain, bloating, constipation, diarrhea or blood in the stool.  GU: Denies urgency, frequency, pain with urination, burning sensation, blood in urine, odor or discharge. Musculoskeletal: Pt reports jaw pain. Denies decrease in range of motion, difficulty with gait, muscle pain or joint pain and swelling.  Skin: Denies redness, rashes, lesions or ulcercations.  Neurological: Denies dizziness, difficulty with memory, difficulty with speech or problems with balance and coordination.  Psych: Pt reports anxiety. Denies depression, SI/HI.  No other specific complaints in a complete review of systems (except as listed in HPI above).   Objective:   Physical Exam   BP 118/78 mmHg  Pulse 66  Temp(Src) 98.5 F (36.9 C) (Oral)  Wt 162 lb (73.483 kg)  SpO2 98%  LMP 09/18/2014 Wt Readings from Last 3 Encounters:  10/11/14 162 lb (73.483 kg)  09/21/14 162 lb (73.483 kg)  09/16/14 164 lb (74.39 kg)    General: Appears her stated age, well developed, well nourished in NAD. HEENT: Head: normal shape and size; Eyes: sclera white, no icterus, conjunctiva  pink, PERRLA and EOMs intact;  Neck: Neck supple, trachea midline. No masses, lumps or thyromegaly present.  Cardiovascular: Normal rate and rhythm. S1,S2 noted.  No murmur, rubs or gallops noted.  Pulmonary/Chest: Normal effort and positive vesicular breath sounds. No respiratory distress. No wheezes, rales or ronchi noted.  Musculoskeletal: Normal opening and closing of the jaw. No TMJ tenderness. No pain with palpation of the mandible. Neurological: Alert and oriented. Negative Phalen, negative Tinels. Psychiatric: She is very anxious.  BMET    Component Value Date/Time   NA 142 10/01/2014 2353   K 3.5 10/01/2014 2353   CL 107 10/01/2014 2353   CO2 29 10/01/2014 2353   GLUCOSE 69 10/01/2014 2353   BUN 15 10/01/2014 2353   CREATININE 0.64 10/01/2014 2353   CALCIUM 9.1 10/01/2014 2353   GFRNONAA >60 10/01/2014 2353   GFRAA >60 10/01/2014 2353    Lipid Panel     Component Value Date/Time   CHOL 199 04/15/2013 1554   TRIG 192.0* 04/15/2013 1554   HDL 37.10* 04/15/2013 1554   CHOLHDL 5 04/15/2013 1554   VLDL 38.4 04/15/2013 1554   LDLCALC 124* 04/15/2013 1554    CBC    Component Value Date/Time   WBC 9.2 10/01/2014 2353   RBC 4.58 10/01/2014 2353   HGB 12.9 10/01/2014 2353   HCT 39.6 10/01/2014 2353   PLT 298 10/01/2014 2353   MCV 86.5 10/01/2014 2353   MCH 28.2 10/01/2014 2353   MCHC 32.6 10/01/2014 2353   RDW 12.6 10/01/2014 2353    Hgb A1C No results found for: HGBA1C      Assessment & Plan:   Hospital follow up for chest pain, jaw pain and left hand paresthesia  Is not cardiac in origin I think it is likely r/t anxiety She has a follow up appt with cardiology anyway (per her request) Will refer to neurology for EMG of LUE TSH is normal  RTC as needed or if symptoms persist or worsen

## 2014-10-22 ENCOUNTER — Telehealth: Payer: Self-pay

## 2014-10-22 ENCOUNTER — Ambulatory Visit (INDEPENDENT_AMBULATORY_CARE_PROVIDER_SITE_OTHER): Payer: BLUE CROSS/BLUE SHIELD | Admitting: Physician Assistant

## 2014-10-22 VITALS — BP 114/70 | HR 78 | Temp 98.5°F | Resp 18 | Ht 64.25 in | Wt 166.4 lb

## 2014-10-22 DIAGNOSIS — Z309 Encounter for contraceptive management, unspecified: Secondary | ICD-10-CM

## 2014-10-22 DIAGNOSIS — R42 Dizziness and giddiness: Secondary | ICD-10-CM | POA: Diagnosis not present

## 2014-10-22 DIAGNOSIS — F418 Other specified anxiety disorders: Secondary | ICD-10-CM

## 2014-10-22 DIAGNOSIS — F419 Anxiety disorder, unspecified: Secondary | ICD-10-CM | POA: Diagnosis not present

## 2014-10-22 LAB — POCT URINALYSIS DIPSTICK
Bilirubin, UA: NEGATIVE
Glucose, UA: NEGATIVE
KETONES UA: NEGATIVE
NITRITE UA: NEGATIVE
PROTEIN UA: NEGATIVE
Spec Grav, UA: 1.025
Urobilinogen, UA: 1
pH, UA: 6.5

## 2014-10-22 LAB — POCT UA - MICROSCOPIC ONLY
Bacteria, U Microscopic: NEGATIVE
CRYSTALS, UR, HPF, POC: NEGATIVE
Casts, Ur, LPF, POC: NEGATIVE
Mucus, UA: NEGATIVE
Yeast, UA: NEGATIVE

## 2014-10-22 MED ORDER — CLONAZEPAM 1 MG PO TABS
0.5000 mg | ORAL_TABLET | Freq: Two times a day (BID) | ORAL | Status: DC | PRN
Start: 2014-10-22 — End: 2014-11-02

## 2014-10-22 MED ORDER — ESCITALOPRAM OXALATE 10 MG PO TABS: 10.0000 mg | ORAL_TABLET | Freq: Every day | ORAL | Status: DC

## 2014-10-22 NOTE — Telephone Encounter (Signed)
Pt left v/m; pt last seen on 10/11/14. Pt has been taking prilosec for chest pain; originally helped with CP but for the last 4 days prilosec is not helping with chest pain and has migraine with nausea for 4 days also; taking ibuprofen for h/a but not really helping. Pt request cb with what else can do. [facility]  [place].

## 2014-10-22 NOTE — Telephone Encounter (Signed)
Spoke to pt and she states she will try Tylenol and if she decides to go to UC she may f/u next week

## 2014-10-22 NOTE — Telephone Encounter (Signed)
Left detailed msg on VM per HIPAA  

## 2014-10-22 NOTE — Progress Notes (Signed)
10/22/2014 at 10:11 PM  Cassandra [name] / DOB: August 12, 1980 / MRN: [id number]  The patient has Frequent headaches; Hypertriglyceridemia; Pain of both breasts; Acute cystitis; and Loss of weight on her problem list.  SUBJECTIVE  Cassandra [name] is a 34 y.o. anxious female presenting for the chief complaint of headache, nausea, dizziness, "tight esophagus, chest pain, palpitations, ear pain, and photosensitivity.  There are ongoing complaints and are waxing and waning in nature. She was evaluated in the ED for chest pain roughly two weeks ago and was found to have a negative troponin, chest radiograph, D-dimer and normal TSH, CBC, and BMET.  She was treated for GERD at that time with an overall improvement in her symptoms. She thinks her IUD may have something to do with her symptoms.  She has seen her PCP for same who felt her symptoms were likely due to anxiety and stress, but wanted to rule out cryptic neurological and cardiac pathology and thus referred her to neurology and cardiology.   She denies dysthymia, anhedonia, poor sleep, SI/HI  today.  She reports poor concentration and word finding difficulty that started roughly one year ago.  She feels fatigued constantly and reports a 40 lbs weight loss over the last two years. She denies any significant family stressors over the last year. She does severely worry about her family anytime she is not with them, and spends 60-70% of her wakeful time with these concerns. She tearfully admits that she worries about her husband being killed by the police because he is black. She also states worrying about how her child and she would get out of the house if there were a fire.  He husband, who is a bodybuilder, tells her that she needs to diet and exercise and she will feel better. She is worried that she is going to have a heart attack, stroke, or a blood clot and reports "I'll do whatever it takes to find out that I am safe" with reference to medical work up.     She comes from a broken home and her father was a drug addict who left the home while she was young.  He mother, who was present initially at the beginning of the interview, reports that she was always getting herself and the patient checked for HIV years ago due to his drug use.  Her mother was tearful in relaying this.     She  has a past medical history of Chicken pox; Frequent headaches; Arrhythmia; H/O hematuria; Nocturnal headaches; Heart palpitations; Anxiety; and GERD (gastroesophageal reflux disease).    Medications reviewed and updated by myself where necessary, and exist elsewhere in the encounter.   Cassandra [name] has No Known Allergies. She  reports that she has never smoked. She does not have any smokeless tobacco history on file. She reports that she drinks alcohol. She reports that she does not use illicit drugs. She  reports that she currently engages in sexual activity. She reports using the following method of birth control/protection: IUD. The patient  has no past surgical history on file.  Her family history includes Arthritis in her maternal grandmother; Diabetes in her maternal grandmother; Drug abuse in her father; Heart disease in her maternal grandfather and maternal grandmother; Hyperlipidemia in her maternal grandmother and mother; Hypertension in her maternal grandmother and mother. There is no history of Other.  Review of Systems  Constitutional: Positive for weight loss and malaise/fatigue. Negative for fever and chills.  HENT: Positive for ear  pain and tinnitus. Negative for congestion and sore throat.   Eyes: Positive for blurred vision and photophobia. Negative for double vision, pain, discharge and redness.  Respiratory: Positive for shortness of breath. Negative for cough, hemoptysis, sputum production and wheezing.   Cardiovascular: Positive for chest pain and palpitations. Negative for leg swelling and PND.  Gastrointestinal: Positive for nausea and abdominal pain.  Negative for heartburn, vomiting, diarrhea, constipation, blood in stool and melena.  Genitourinary: Negative for dysuria, urgency, frequency, hematuria and flank pain.  Musculoskeletal: Positive for myalgias. Negative for falls and neck pain.  Skin: Negative for itching and rash.  Neurological: Positive for headaches. Negative for dizziness.  Psychiatric/Behavioral: Positive for memory loss. Negative for depression, suicidal ideas, hallucinations and substance abuse. The patient is nervous/anxious. The patient does not have insomnia.     OBJECTIVE  Her  height is 5' 4.25" (1.632 m) and weight is 166 lb 6.4 oz (75.479 kg). Her oral temperature is 98.5 F (36.9 C). Her blood pressure is 114/70 and her pulse is 78. Her respiration is 18 and oxygen saturation is 99%.  The patient's body mass index is 28.34 kg/(m^2).  Physical Exam  Constitutional: She is oriented to person, place, and time. Vital signs are normal. She appears well-developed and well-nourished. No distress.  HENT:  Head: Normocephalic.  Right Ear: Hearing and tympanic membrane normal.  Left Ear: Hearing and tympanic membrane normal.  Nose: Nose normal.  Mouth/Throat: Uvula is midline, oropharynx is clear and moist and mucous membranes are normal.  Eyes: Conjunctivae and EOM are normal. Pupils are equal, round, and reactive to light.  Neck: Normal range of motion. Neck supple. No JVD present. No tracheal deviation present. No thyromegaly present.  Cardiovascular: Normal rate, regular rhythm, normal heart sounds and intact distal pulses.  Exam reveals no gallop and no friction rub.   No murmur heard. Respiratory: Effort normal and breath sounds normal. No stridor. No tachypnea.  GI: Soft. Bowel sounds are normal. She exhibits no distension and no mass. There is no tenderness. There is no rebound and no guarding.  Musculoskeletal: Normal range of motion.  Lymphadenopathy:    She has no cervical adenopathy.  Neurological: She  is alert and oriented to person, place, and time. She has normal reflexes.  Skin: Skin is warm and dry. She is not diaphoretic.    Results for orders placed or performed in visit on 10/22/14 (from the past 24 hour(s))  POCT urinalysis dipstick     Status: Abnormal   Collection Time: 10/22/14  7:14 PM  Result Value Ref Range   Color, UA yellow    Clarity, UA clear    Glucose, UA neg    Bilirubin, UA neg    Ketones, UA neg    Spec Grav, UA 1.025    Blood, UA small    pH, UA 6.5    Protein, UA neg    Urobilinogen, UA 1.0    Nitrite, UA neg    Leukocytes, UA Trace (A) Negative  POCT UA - Microscopic Only     Status: None   Collection Time: 10/22/14  7:17 PM  Result Value Ref Range   WBC, Ur, HPF, POC 0-4    RBC, urine, microscopic 0-3    Bacteria, U Microscopic neg    Mucus, UA neg    Epithelial cells, urine per micros 0-4    Crystals, Ur, HPF, POC neg    Casts, Ur, LPF, POC neg    Yeast, UA neg  ASSESSMENT & PLAN  Cassandra [name] was seen today for chest pain, headache and ear pain.  Diagnoses and all orders for this visit:  Anxiety with somatic features: Patient with a constellation of complaints and a negative ED work up for serious pathology within the last month. Chart review reveals that she has had multiple similar complaints in the past and yet she remains physically healthy. I have advised her to follow up with neuro and cards per PCP recs.  I agree with her PCP that her symptoms have a psychiatric etiology and will treat to that end.  I have explained to the patient that anxiety/depression are exceedingly common and a trial of SSRI and bridge therapy is perfectly reasonable, as the benefits far outweigh the risks.  -     escitalopram (LEXAPRO) 10 MG tablet; Take 1 tablet (10 mg total) by mouth daily. -     clonazePAM (KLONOPIN) 1 MG tablet; Take 0.5 tablets (0.5 mg total) by mouth 2 (two) times daily as needed for anxiety.  Dizziness:  2/2 to problem 1.   -     POCT  urinalysis dipstick -     POCT UA - Microscopic Only -     Urine culture -     POCT UA - Microscopic Only  Encounter for contraceptive management, unspecified encounter: Due to patient's concern that her IUD is causing her symptoms.  -     Ambulatory referral to Gynecology  I have spent greater that 45 minutes with this patient, with over 50% of that time spent in discussion of her symptoms and the assessment and plan.    The atient was advised to call or come back to clinic if she does not see an improvement in symptoms, or worsens with the above plan.   Philis Fendt, MHS, PA-C [facility] 10/22/2014 10:11 PM

## 2014-10-22 NOTE — Telephone Encounter (Signed)
The ibuprofen is probably causing the nausea. I would have her try 1000 mg Tylenol every 8 hours and if pain persists, she may need to go to UC for treatment over the weekend.

## 2014-10-24 LAB — URINE CULTURE: Colony Count: 15000

## 2014-11-02 ENCOUNTER — Encounter: Payer: Self-pay | Admitting: Obstetrics and Gynecology

## 2014-11-02 ENCOUNTER — Ambulatory Visit (INDEPENDENT_AMBULATORY_CARE_PROVIDER_SITE_OTHER): Payer: BLUE CROSS/BLUE SHIELD | Admitting: Obstetrics and Gynecology

## 2014-11-02 VITALS — BP 112/60 | HR 80 | Resp 16 | Ht 64.5 in | Wt 167.0 lb

## 2014-11-02 DIAGNOSIS — Z30432 Encounter for removal of intrauterine contraceptive device: Secondary | ICD-10-CM | POA: Diagnosis not present

## 2014-11-02 DIAGNOSIS — F411 Generalized anxiety disorder: Secondary | ICD-10-CM

## 2014-11-02 DIAGNOSIS — G43009 Migraine without aura, not intractable, without status migrainosus: Secondary | ICD-10-CM | POA: Diagnosis not present

## 2014-11-02 DIAGNOSIS — R5382 Chronic fatigue, unspecified: Secondary | ICD-10-CM

## 2014-11-02 DIAGNOSIS — Z3009 Encounter for other general counseling and advice on contraception: Secondary | ICD-10-CM | POA: Diagnosis not present

## 2014-11-02 DIAGNOSIS — IMO0002 Reserved for concepts with insufficient information to code with codable children: Secondary | ICD-10-CM

## 2014-11-02 DIAGNOSIS — N941 Dyspareunia: Secondary | ICD-10-CM | POA: Diagnosis not present

## 2014-11-02 LAB — POCT URINE PREGNANCY: Preg Test, Ur: NEGATIVE

## 2014-11-02 NOTE — Patient Instructions (Signed)
Use condoms  Have your neurologist send a copy of the office note here  Oral Contraception Information Oral contraceptive pills (OCPs) are medicines taken to prevent pregnancy. OCPs work by preventing the ovaries from releasing eggs. The hormones in OCPs also cause the cervical mucus to thicken, preventing the sperm from entering the uterus. The hormones also cause the uterine lining to become thin, not allowing a fertilized egg to attach to the inside of the uterus. OCPs are highly effective when taken exactly as prescribed. However, OCPs do not prevent sexually transmitted diseases (STDs). Safe sex practices, such as using condoms along with the pill, can help prevent STDs.  Before taking the pill, you may have a physical exam and Pap test. Your health care provider may order blood tests. The health care provider will make sure you are a good candidate for oral contraception. Discuss with your health care provider the possible side effects of the OCP you may be prescribed. When starting an OCP, it can take 2 to 3 months for the body to adjust to the changes in hormone levels in your body.  TYPES OF ORAL CONTRACEPTION  The combination pill--This pill contains estrogen and progestin (synthetic progesterone) hormones. The combination pill comes in 21-day, 28-day, or 91-day packs. Some types of combination pills are meant to be taken continuously (365-day pills). With 21-day packs, you do not take pills for 7 days after the last pill. With 28-day packs, the pill is taken every day. The last 7 pills are without hormones. Certain types of pills have more than 21 hormone-containing pills. With 91-day packs, the first 84 pills contain both hormones, and the last 7 pills contain no hormones or contain estrogen only.  The minipill--This pill contains the progesterone hormone only. The pill is taken every day continuously. It is very important to take the pill at the same time each day. The minipill comes in packs  of 28 pills. All 28 pills contain the hormone.  ADVANTAGES OF ORAL CONTRACEPTIVE PILLS  Decreases premenstrual symptoms.   Treats menstrual period cramps.   Regulates the menstrual cycle.   Decreases a heavy menstrual flow.   May treatacne, depending on the type of pill.   Treats abnormal uterine bleeding.   Treats polycystic ovarian syndrome.   Treats endometriosis.   Can be used as emergency contraception.  THINGS THAT CAN MAKE ORAL CONTRACEPTIVE PILLS LESS EFFECTIVE OCPs can be less effective if:   You forget to take the pill at the same time every day.   You have a stomach or intestinal disease that lessens the absorption of the pill.   You take OCPs with other medicines that make OCPs less effective, such as antibiotics, certain HIV medicines, and some seizure medicines.   You take expired OCPs.   You forget to restart the pill on day 7, when using the packs of 21 pills.  RISKS ASSOCIATED WITH ORAL CONTRACEPTIVE PILLS  Oral contraceptive pills can sometimes cause side effects, such as:  Headache.  Nausea.  Breast tenderness.  Irregular bleeding or spotting. Combination pills are also associated with a small increased risk of:  Blood clots.  Heart attack.  Stroke. Document Released: 05/19/2002 Document Revised: 12/17/2012 Document Reviewed: 08/17/2012 [facility] Patient Information 2015 [place], [place]. This information is not intended to replace advice given to you by your health care provider. Make sure you discuss any questions you have with your health care provider.

## 2014-11-02 NOTE — Progress Notes (Signed)
Patient ID: Cassandra [name], female   DOB: Sep 23, 1980, 34 y.o.   MRN: [id number] GYNECOLOGY  VISIT   HPI: 34 y.o.   Married  [place] American  female   [phone] with Patient's last menstrual period was 09/24/2014.   Here to discuss birth control options. The patient has a mirena IUD, placed in 2013 after the birth of her last child. She is having multiple c/o and wonders if they could be related to the IUD. She c/o issues with forgetfulness, fatigue,numbness and tingling in her left arm and leg, chest pains, anxiety, and migraines without aura. All symptoms, other than the numbness, started 2.5 years ago after the mirena was placed. "All" of her blood work has been normal. Chest pain is felt to be secondary to GERD. She has an appointment next month to see a neurologist to evaluate her numbness and migraines. She was given a script for lexapro for the anxiety, but decided not to take it. Menses q month, this is the first month she is late. Bleeds x 4-5 days. No BTB, mild cramps. Saturates a pad 2-3 x a day (not full).  She had a testosterone level checked secondary to hair loss, it was slightly elevated. No hirsutism or acne.  In addition, she c/o intermittent deep dyspareunia since the IUD was placed. It occurs most of the time and is sometimes helped with position change.  GYNECOLOGIC HISTORY: Patient's last menstrual period was 09/24/2014. Contraception:Mirena IUD Menopausal hormone therapy: N/A Last mammogram: 04-08-14 WNL Last pap smear: 11-18-13 WNL         OB History    Gravida Para Term Preterm AB TAB SAB Ectopic Multiple Living   7 3 3  4  3   3          Patient Active Problem List   Diagnosis Date Noted  . Loss of weight 09/21/2014  . Acute cystitis 09/16/2014  . Pain of both breasts 03/31/2014  . Hypertriglyceridemia 05/15/2013  . Frequent headaches 04/15/2013    Past Medical History  Diagnosis Date  . Chicken pox   . Frequent headaches   . Arrhythmia   . H/O hematuria   .  Nocturnal headaches   . Heart palpitations   . Anxiety   . GERD (gastroesophageal reflux disease)   . Genital warts     History reviewed. No pertinent past surgical history.  Current Outpatient Prescriptions  Medication Sig Dispense Refill  . ibuprofen (ADVIL,MOTRIN) 200 MG tablet Take 400 mg by mouth as needed.     Marland Kitchen levonorgestrel (MIRENA) 20 MCG/24HR IUD 1 each by Intrauterine route once.    Marland Kitchen omeprazole (PRILOSEC) 20 MG capsule Take 20 mg by mouth daily.     No current facility-administered medications for this visit.     ALLERGIES: Review of patient's allergies indicates no known allergies.  Family History  Problem Relation Age of Onset  . Hyperlipidemia Mother   . Hypertension Mother   . Drug abuse Father   . Arthritis Maternal Grandmother   . Hyperlipidemia Maternal Grandmother   . Heart disease Maternal Grandmother   . Hypertension Maternal Grandmother   . Diabetes Maternal Grandmother   . Heart disease Maternal Grandfather   . Other Neg Hx     pco    Social History   Social History  . Marital Status: Married    Spouse Name: N/A  . Number of Children: N/A  . Years of Education: N/A   Occupational History  . Not on file.  Social History Main Topics  . Smoking status: Never Smoker   . Smokeless tobacco: Never Used  . Alcohol Use: 0.0 oz/week    0 Standard drinks or equivalent per week     Comment: occasional  . Drug Use: No  . Sexual Activity:    Partners: Male    Patent examiner Protection: IUD   Other Topics Concern  . Not on file   Social History Narrative    ROS:  Pertinent items are noted in HPI.  PHYSICAL EXAMINATION:    BP 112/60 mmHg  Pulse 80  Resp 16  Ht 5' 4.5" (1.638 m)  Wt 167 lb (75.751 kg)  BMI 28.23 kg/m2  LMP 09/24/2014    General appearance: alert, cooperative and appears stated age Neck: no adenopathy, supple, symmetrical, trachea midline and thyroid normal to inspection and palpation Abd: soft, not tender, not  distended, no masses  Pelvic: External genitalia:  no lesions              Urethra:  normal appearing urethra with no masses, tenderness or lesions              Bartholins and Skenes: normal                 Vagina: normal appearing vagina with normal color and discharge, no lesions              Cervix: no lesions, IUD string 4 cm. IUD removed with ringed forceps, no CMT Bimanual Exam:  Uterus: anteverted, mobile, mildly tender, normal sized (examined prior to IUD insertion).   Adnexa: mildly tender, no masses  No pelvic floor tenderness   Chaperone was present for exam.  ASSESSMENT Multiple c/o, including: forgetfulness, fatigue, numbness, anxiety and migraines.  Migraines and most of her other symptoms, since starting the mirena. Discussed that it seems unlikely the mirena is responsible for all of her symptoms, but it could increase migraines and anxiety Dyspareunia Contraception Mild pelvic tenderness    PLAN Recommend we pull the mirena to see if her symptoms improve, IUD removed Use condoms See neurology, want to make sure her numbness isn't a complex migraine Discussed barrier methods, OCP's, copper IUD, sterilization If her symptms don't improve off of the mirena, could replace the mirena She is leaning toward the copper IUD F/U 1 week after she see's neurolgy Will do a repeat pelvic exam at her next visit to access tenderness    An After Visit Summary was printed and given to the patient.  35 minutes face to face time of which over 50% was spent in counseling.   CC: Dr Narda Amber (Neurology)

## 2014-11-18 ENCOUNTER — Encounter: Payer: BLUE CROSS/BLUE SHIELD | Admitting: Cardiology

## 2014-11-26 ENCOUNTER — Encounter: Payer: BLUE CROSS/BLUE SHIELD | Admitting: Neurology

## 2014-11-26 ENCOUNTER — Other Ambulatory Visit: Payer: Self-pay | Admitting: *Deleted

## 2014-11-26 DIAGNOSIS — R202 Paresthesia of skin: Secondary | ICD-10-CM

## 2014-11-30 ENCOUNTER — Encounter: Payer: BLUE CROSS/BLUE SHIELD | Admitting: Neurology

## 2014-12-07 ENCOUNTER — Ambulatory Visit (INDEPENDENT_AMBULATORY_CARE_PROVIDER_SITE_OTHER): Payer: BLUE CROSS/BLUE SHIELD | Admitting: Neurology

## 2014-12-07 DIAGNOSIS — R202 Paresthesia of skin: Secondary | ICD-10-CM

## 2014-12-07 NOTE — Procedures (Signed)
[facility]  [address], [address]  [place], [place] [address] Tel: [phone] Fax:  [phone] Test Date:  12/07/2014  Patient: Cassandra [name] DOB: 01/31/1981 Physician: Narda Amber  Sex: Female Height: 5\' 4"  Ref Phys: Webb Silversmith  ID#: [id number] Temp: 34.1C Technician:    Patient Complaints: This is a 34 year-old female presenting for evaluation of left hand paresthesias.   NCV & EMG Findings: Extensive electrodiagnostic testing of the left upper extremity shows:  1. Left median, ulnar, radial, and palmar sensory responses are within normal limits.  2. Left median and ulnar motor responses are within normal limits.  3. All nerve conduction studies (as indicated in the following tables) were within normal limits.   4. There is no evidence of active or chronic motor axon loss changes affecting any of the tested muscles. Motor unit configuration and recruitment pattern is within normal limits.  Impression: This is a normal study of the left upper extremity. In particular, there is no evidence of carpal tunnel syndrome or cervical radiculopathy.   ___________________________ Narda Amber, DO    Nerve Conduction Studies Anti Sensory Summary Table   Stim Site NR Peak (ms) Norm Peak (ms) P-T Amp (V) Norm P-T Amp  Left Median Anti Sensory (2nd Digit)  34.1C  Wrist    3.1 <3.4 52.6 >20  Left Radial Anti Sensory (Base 1st Digit)  34.1C  Wrist    2.1 <2.7 37.4 >18  Left Ulnar Anti Sensory (5th Digit)  34.1C  Wrist    3.0 <3.1 32.6 >12   Motor Summary Table   Stim Site NR Onset (ms) Norm Onset (ms) O-P Amp (mV) Norm O-P Amp Site1 Site2 Delta-0 (ms) Dist (cm) Vel (m/s) Norm Vel (m/s)  Left Median Motor (Abd Poll Brev)  34.1C  Wrist    3.0 <3.9 8.9 >6 Elbow Wrist 4.3 23.0 53 >50  Elbow    7.3  8.8         Left Ulnar Motor (Abd Dig Minimi)  34.1C  Wrist    2.7 <3.1 8.8 >7 B Elbow Wrist 3.3 20.0 61 >50  B Elbow    6.0  8.8  A Elbow B Elbow 1.6 10.0 63 >50    A Elbow    7.6  8.4          Comparison Summary Table   Stim Site NR Peak (ms) Norm Peak (ms) P-T Amp (V) Site1 Site2 Delta-P (ms) Norm Delta (ms)  Left Median/Ulnar Palm Comparison (Wrist - 8cm)  34.1C  Median Palm    1.8 <2.2 49.7 Median Palm Ulnar Palm 0.0   Ulnar Palm    1.8 <2.2 15.1       EMG   Side Muscle Ins Act Fibs Psw Fasc Number Recrt Dur Dur. Amp Amp. Poly Poly. Comment  Left 1stDorInt Nml Nml Nml Nml Nml Nml Nml Nml Nml Nml Nml Nml N/A  Left Ext Indicis Nml Nml Nml Nml Nml Nml Nml Nml Nml Nml Nml Nml N/A  Left PronatorTeres Nml Nml Nml Nml Nml Nml Nml Nml Nml Nml Nml Nml N/A  Left Biceps Nml Nml Nml Nml Nml Nml Nml Nml Nml Nml Nml Nml N/A  Left Triceps Nml Nml Nml Nml Nml Nml Nml Nml Nml Nml Nml Nml N/A  Left Deltoid Nml Nml Nml Nml Nml Nml Nml Nml Nml Nml Nml Nml N/A      Waveforms:

## 2014-12-08 ENCOUNTER — Ambulatory Visit: Payer: BLUE CROSS/BLUE SHIELD | Admitting: Obstetrics and Gynecology

## 2014-12-09 ENCOUNTER — Encounter: Payer: BLUE CROSS/BLUE SHIELD | Admitting: Cardiology

## 2014-12-13 ENCOUNTER — Ambulatory Visit: Payer: BLUE CROSS/BLUE SHIELD | Admitting: Obstetrics and Gynecology

## 2014-12-14 ENCOUNTER — Ambulatory Visit: Payer: BLUE CROSS/BLUE SHIELD | Admitting: Obstetrics and Gynecology

## 2015-01-04 ENCOUNTER — Encounter: Payer: BLUE CROSS/BLUE SHIELD | Admitting: Cardiology

## 2015-01-06 ENCOUNTER — Emergency Department (HOSPITAL_COMMUNITY): Payer: BLUE CROSS/BLUE SHIELD

## 2015-01-06 ENCOUNTER — Emergency Department (HOSPITAL_COMMUNITY)
Admission: EM | Admit: 2015-01-06 | Discharge: 2015-01-07 | Disposition: A | Discharge status: Home / Self Care | Payer: BLUE CROSS/BLUE SHIELD | Attending: Emergency Medicine | Admitting: Emergency Medicine

## 2015-01-06 DIAGNOSIS — R079 Chest pain, unspecified: Secondary | ICD-10-CM | POA: Insufficient documentation

## 2015-01-06 DIAGNOSIS — Z8679 Personal history of other diseases of the circulatory system: Secondary | ICD-10-CM | POA: Insufficient documentation

## 2015-01-06 DIAGNOSIS — Z8719 Personal history of other diseases of the digestive system: Secondary | ICD-10-CM | POA: Insufficient documentation

## 2015-01-06 DIAGNOSIS — Z8619 Personal history of other infectious and parasitic diseases: Secondary | ICD-10-CM | POA: Insufficient documentation

## 2015-01-06 DIAGNOSIS — Z3202 Encounter for pregnancy test, result negative: Secondary | ICD-10-CM | POA: Insufficient documentation

## 2015-01-06 DIAGNOSIS — Z8659 Personal history of other mental and behavioral disorders: Secondary | ICD-10-CM | POA: Insufficient documentation

## 2015-01-06 DIAGNOSIS — Z87448 Personal history of other diseases of urinary system: Secondary | ICD-10-CM | POA: Insufficient documentation

## 2015-01-06 LAB — BASIC METABOLIC PANEL
Anion gap: 6 (ref 5–15)
BUN: 17 mg/dL (ref 6–20)
CHLORIDE: 106 mmol/L (ref 101–111)
CO2: 28 mmol/L (ref 22–32)
Calcium: 9.3 mg/dL (ref 8.9–10.3)
Creatinine, Ser: 0.76 mg/dL (ref 0.44–1.00)
GFR calc Af Amer: >60 mL/min (ref >60)
GFR calc non Af Amer: >60 mL/min (ref >60)
GLUCOSE: 81 mg/dL (ref 65–99)
POTASSIUM: 3.5 mmol/L (ref 3.5–5.1)
Sodium: 140 mmol/L (ref 135–145)

## 2015-01-06 LAB — CBC
HCT: 39.2 % (ref 36.0–46.0)
Hemoglobin: 12.9 g/dL (ref 12.0–15.0)
MCH: 28.8 pg (ref 26.0–34.0)
MCHC: 32.9 g/dL (ref 30.0–36.0)
MCV: 87.5 fL (ref 78.0–100.0)
Platelets: 321 10*3/uL (ref 150–400)
RBC: 4.48 MIL/uL (ref 3.87–5.11)
RDW: 12.5 % (ref 11.5–15.5)
WBC: 9.5 10*3/uL (ref 4.0–10.5)

## 2015-01-06 LAB — I-STAT TROPONIN, ED
Troponin i, poc: 0 ng/mL (ref 0.00–0.08)
Troponin i, poc: 0 ng/mL (ref 0.00–0.08)

## 2015-01-06 LAB — PREGNANCY, URINE: PREG TEST UR: NEGATIVE

## 2015-01-06 MED ORDER — GI COCKTAIL ~~LOC~~
30.0000 mL | Freq: Once | ORAL | Status: AC
  Administered 2015-01-06: 30 mL via ORAL
  Filled 2015-01-06: qty 30

## 2015-01-06 MED ORDER — FAMOTIDINE IN NACL 20-0.9 MG/50ML-% IV SOLN
20.0000 mg | Freq: Once | INTRAVENOUS | Status: AC
  Administered 2015-01-06: 20 mg via INTRAVENOUS
  Filled 2015-01-06: qty 50

## 2015-01-06 NOTE — ED Notes (Signed)
Pt states that she has had chest pain for several years that started again tonight. States that she has been diagnosed with angina but is worried it is something more. Alert and oriented.

## 2015-01-06 NOTE — ED Provider Notes (Signed)
CSN: [id number]     Arrival date & time 01/06/15  1938 History   First MD Initiated Contact with Patient 01/06/15 2129     Chief Complaint  Patient presents with  . Chest Pain     (Consider location/radiation/quality/duration/timing/severity/associated sxs/prior Treatment) HPI   Cassandra [name] is a 34 y.o. female, pt with history of heart palpitations and GERD, presents with retrosternal chest pain that she has had on and off for years, but recurred yesterday and this time did not go away. Pt states it's "like a clenched fist" in her chest, rates it 7/10, non-radiating. Pt has not noticed if it changes with eating or with change in position. Pt states she used to take Prilosec for GERD, said it helped some of her chest pain issues, but then stopped taking it. Pt denies shortness of breath, peripheral edema, N/V, abdominal pain, fever/chills, or any other complaints.  Pt has been seen in the ED before and states, "they never find anything. My heart is never found to be at fault." Pt states her PCP referred her to a cardiologist, who she is scheduled to see next Tuesday.     Past Medical History  Diagnosis Date  . Chicken pox   . Frequent headaches   . Arrhythmia   . H/O hematuria   . Nocturnal headaches   . Heart palpitations   . Anxiety   . GERD (gastroesophageal reflux disease)   . Genital warts    Past Surgical History  Procedure Laterality Date  . No past surgeries     Family History  Problem Relation Age of Onset  . Hyperlipidemia Mother   . Hypertension Mother   . Drug abuse Father   . Arthritis Maternal Grandmother   . Hyperlipidemia Maternal Grandmother   . Heart disease Maternal Grandmother   . Hypertension Maternal Grandmother   . Diabetes Maternal Grandmother   . Heart disease Maternal Grandfather   . Other Neg Hx     pco   Social History  Substance Use Topics  . Smoking status: Never Smoker   . Smokeless tobacco: Never Used  . Alcohol Use: 0.0 oz/week     0 Standard drinks or equivalent per week     Comment: occasional   OB History    Gravida Para Term Preterm AB TAB SAB Ectopic Multiple Living   7 3 3  4  3   3      Review of Systems  Constitutional: Negative for fever, chills, diaphoresis and unexpected weight change.  Respiratory: Negative for cough and shortness of breath.   Cardiovascular: Positive for chest pain. Negative for palpitations and leg swelling.  Gastrointestinal: Negative for nausea, vomiting, abdominal pain, diarrhea and constipation.  Genitourinary: Negative for dysuria and flank pain.  Musculoskeletal: Negative for back pain.  Skin: Negative for color change and pallor.  Neurological: Negative for dizziness, syncope, weakness and light-headedness.  All other systems reviewed and are negative.     Allergies  Review of patient's allergies indicates no known allergies.  Home Medications   Prior to Admission medications   Medication Sig Start Date End Date Taking? Authorizing Provider  acetaminophen (TYLENOL) 500 MG tablet Take 1,000 mg by mouth every 6 (six) hours as needed for mild pain.   Yes Historical Provider, MD  ibuprofen (ADVIL,MOTRIN) 200 MG tablet Take 400 mg by mouth as needed for moderate pain.    Yes Historical Provider, MD   BP 135/69 mmHg  Pulse 78  Temp(Src) 98.3 F (36.8  C) (Oral)  Resp 16  SpO2 100%  LMP 12/11/2014 Physical Exam  Constitutional: She appears well-developed and well-nourished. No distress.  HENT:  Head: Normocephalic and atraumatic.  Eyes: Conjunctivae are normal. Pupils are equal, round, and reactive to light.  Cardiovascular: Normal rate, regular rhythm and normal heart sounds.   Pulmonary/Chest: Effort normal and breath sounds normal. No respiratory distress. She exhibits no tenderness, no crepitus and no deformity.  Abdominal: Soft. Bowel sounds are normal.  Musculoskeletal: She exhibits no edema or tenderness.  Lymphadenopathy:    She has no cervical adenopathy.   Neurological: She is alert.  Skin: Skin is warm and dry. She is not diaphoretic.  Nursing note and vitals reviewed.   ED Course  Procedures (including critical care time) Labs Review Labs Reviewed  BASIC METABOLIC PANEL  CBC  PREGNANCY, URINE  I-STAT [place], ED  I-STAT TROPOIN[place], ED    Imaging Review Dg Chest 2 View  01/06/2015  CLINICAL DATA:  Pt complains of mid chest pain "for a while". She also complains of some tightness in chest. Ex smoker. Shielded pt. EXAM: CHEST  2 VIEW COMPARISON:  10/01/2014 FINDINGS: The heart size and mediastinal contours are within normal limits. Both lungs are clear. The visualized skeletal structures are unremarkable. IMPRESSION: No active cardiopulmonary disease. Electronically Signed   By: Nolon Nations M.D.   On: 01/06/2015 20:10   I have personally reviewed and evaluated these images and lab results as part of my medical decision-making.   EKG Interpretation None      MDM   Final diagnoses:  Chest pain, unspecified chest pain type    [name] presents with acute on chronic chest pain that recurred yesterday.  Findings and plan of care discussed with Milton Ferguson, MD.  Pt was prepared for the possibility that she may not have answers tonight and will need to keep her cardiologist appt. It was also explained to pt that we would look for the most important and serious issues that could be wrong.  Serial troponins negative. EKG shows sinus rhythm with no ectopics. Pain subsided after Pepcid and GI cocktail. Pt told to keep her appt with the cardiologist she previously had set up. Pt agreed to all parts of discharge and care plan.  Lorayne Bender, PA-C 01/07/15 0004  Milton Ferguson, MD 01/10/15 [phone]

## 2015-01-07 NOTE — ED Notes (Signed)
Patient was alert, oriented and stable upon discharge. RN went over AVS and patient had no further questions.  

## 2015-01-07 NOTE — Discharge Instructions (Signed)
You have been seen today for chest pain. Your imaging and lab tests showed no abnormalities. Follow up with PCP for treatment for your GERD. Keep your appointment with the cardiologist. Return to ED should symptoms worsen.

## 2015-01-13 ENCOUNTER — Ambulatory Visit (INDEPENDENT_AMBULATORY_CARE_PROVIDER_SITE_OTHER): Payer: BLUE CROSS/BLUE SHIELD | Admitting: Obstetrics and Gynecology

## 2015-01-13 ENCOUNTER — Ambulatory Visit: Payer: BLUE CROSS/BLUE SHIELD | Admitting: Obstetrics and Gynecology

## 2015-01-13 ENCOUNTER — Ambulatory Visit (INDEPENDENT_AMBULATORY_CARE_PROVIDER_SITE_OTHER): Payer: BLUE CROSS/BLUE SHIELD

## 2015-01-13 ENCOUNTER — Encounter: Payer: Self-pay | Admitting: Internal Medicine

## 2015-01-13 ENCOUNTER — Ambulatory Visit (INDEPENDENT_AMBULATORY_CARE_PROVIDER_SITE_OTHER): Payer: BLUE CROSS/BLUE SHIELD | Admitting: Internal Medicine

## 2015-01-13 ENCOUNTER — Encounter: Payer: Self-pay | Admitting: Obstetrics and Gynecology

## 2015-01-13 VITALS — BP 110/70 | HR 80 | Ht 63.0 in | Wt 170.3 lb

## 2015-01-13 VITALS — BP 122/76 | HR 70 | Wt 170.0 lb

## 2015-01-13 DIAGNOSIS — Z0000 Encounter for general adult medical examination without abnormal findings: Secondary | ICD-10-CM | POA: Diagnosis not present

## 2015-01-13 DIAGNOSIS — R079 Chest pain, unspecified: Secondary | ICD-10-CM | POA: Diagnosis not present

## 2015-01-13 DIAGNOSIS — R002 Palpitations: Secondary | ICD-10-CM

## 2015-01-13 DIAGNOSIS — I208 Other forms of angina pectoris: Secondary | ICD-10-CM | POA: Diagnosis not present

## 2015-01-13 DIAGNOSIS — R202 Paresthesia of skin: Secondary | ICD-10-CM | POA: Diagnosis not present

## 2015-01-13 DIAGNOSIS — Z01419 Encounter for gynecological examination (general) (routine) without abnormal findings: Secondary | ICD-10-CM

## 2015-01-13 DIAGNOSIS — G43909 Migraine, unspecified, not intractable, without status migrainosus: Secondary | ICD-10-CM

## 2015-01-13 LAB — LIPID PANEL
Cholesterol: 183 mg/dL (ref 125–200)
HDL: 45 mg/dL — ABNORMAL LOW (ref >=46)
LDL Cholesterol: 117 mg/dL (ref <130)
Total CHOL/HDL Ratio: 4.1 Ratio (ref <=5.0)
Triglycerides: 103 mg/dL (ref <150)
VLDL: 21 mg/dL (ref <30)

## 2015-01-13 NOTE — Progress Notes (Signed)
OFFICE NOTE  Chief Complaint:  Recurrent chest pain  Primary Care Physician: Webb Silversmith, NP  HPI:  Cassandra [name] is a pleasant 34 yo female who has had several presentations to the ER for recurrent chest pain. It seems that her symptoms are worsening in frequency and severity. She was seen in the ER last week for squeezing chest pressure that came on without warning, seemed to radiate to her neck and was associated with pain down her left arm. EKG showed no ischemic changes and she ruled-out for acute MI.  She has few cardiac risk factors, however, was a former smoker. There is hypertension and dyslipidemia in her parents, but she has not been diagnosed with this. Her symptoms are not necessarily worse after eating or attributed to exercise or exertion. There are no clear alleviating factors. In the ER. She was given pepcid and a GI cocktail - she said it felt numbing and eventually, her symptoms went away. She has a history of either migraine or cluster headache - apparently worsened by the Mirena IUD which was explanted, but she still has some headaches, just not as severe. With regards to her left arm pain, she recently was seen by neurology and underwent NCS. Those results are pending.  Finally, she is describing palpitations as well. These occur mostly at night, but have been associated with the chest pain. They are also occurring more frequently, especially since her most recent ER visit. She says several times a week.  PMHx:  Past Medical History  Diagnosis Date  . Chicken pox   . Frequent headaches   . Arrhythmia   . H/O hematuria   . Nocturnal headaches   . Heart palpitations   . Anxiety   . GERD (gastroesophageal reflux disease)   . Genital warts     Past Surgical History  Procedure Laterality Date  . No past surgeries      FAMHx:  Family History  Problem Relation Age of Onset  . Hyperlipidemia Mother   . Hypertension Mother   . Drug abuse Father   . Arthritis  Maternal Grandmother   . Hyperlipidemia Maternal Grandmother   . Heart disease Maternal Grandmother   . Hypertension Maternal Grandmother   . Diabetes Maternal Grandmother   . Heart disease Maternal Grandfather   . Other Neg Hx     pco    SOCHx:   reports that she has quit smoking. Her smoking use included Cigarettes. She has never used smokeless tobacco. She reports that she drinks alcohol. She reports that she does not use illicit drugs.  ALLERGIES:  No Known Allergies  ROS: A comprehensive review of systems was negative except for: Cardiovascular: positive for chest pressure/discomfort  HOME MEDS: Current Outpatient Prescriptions  Medication Sig Dispense Refill  . acetaminophen (TYLENOL) 500 MG tablet Take 1,000 mg by mouth every 6 (six) hours as needed for mild pain.    Marland Kitchen aspirin-acetaminophen-caffeine (EXCEDRIN MIGRAINE) 250-250-65 MG tablet Take 1 tablet by mouth every 6 (six) hours as needed for headache.    . ibuprofen (ADVIL,MOTRIN) 200 MG tablet Take 400 mg by mouth as needed for moderate pain.      No current facility-administered medications for this visit.    LABS/IMAGING: No results found for this or any previous visit (from the past 48 hour(s)). No results found.  WEIGHTS: Wt Readings from Last 3 Encounters:  01/13/15 170 lb 4.8 oz (77.248 kg)  11/02/14 167 lb (75.751 kg)  10/22/14 166 lb 6.4  oz (75.479 kg)    VITALS: BP 110/70 mmHg  Pulse 80  Ht 5\' 3"  (1.6 m)  Wt 170 lb 4.8 oz (77.248 kg)  BMI 30.18 kg/m2  LMP 12/11/2014  EXAM: General appearance: alert and no distress Neck: no carotid bruit and no JVD Lungs: clear to auscultation bilaterally Heart: regular rate and rhythm, S1, S2 normal, no murmur, click, rub or gallop Abdomen: soft, non-tender; bowel sounds normal; no masses,  no organomegaly Extremities: extremities normal, atraumatic, no cyanosis or edema Pulses: 2+ and symmetric Skin: Skin color, texture, turgor normal. No rashes or  lesions Neurologic: Grossly normal Psych: Pleasant  EKG: (EKG from the emergency department was reviewed - sinus rhythm without ischemic changes)  ASSESSMENT: 1. Progressive SSCP with neck and arm pain, suggestive of angina versus possible esophageal spasm 2. Palpitations  PLAN: 1.   Mrs. Mceuen has had progressive, intense chest pain with neck/jaw pain and left arm pain that has lead to multiple ER visits, however, work-up has been unrevealing. I feel she will need definitive coronary artery assessment. I have recommend a coronary CT angiogram, which has a high negative predictive value - especially given her age. If this were to show essentially normal coronaries, then I would recommend a GI evaluation for esophageal spasm, GERD or perhaps atypical biliary disease. Finally, she is having palpitations - this can be associated with the chest pain, but do not seem to precede it. Generally, they are worse at night and occurring more frequently. ?if these could be ischemically mediated. Would recommend placing a 1 week monitor today. Plan to see her back to discuss the studies in a few weeks.   Thanks for the kind referral.  Pixie Casino, MD, [facility] Attending Cardiologist [place] 01/13/2015, 9:50 AM

## 2015-01-13 NOTE — Progress Notes (Signed)
Patient ID: Cassandra [name], female   DOB: [birth year], 34 y.o.   MRN: [id number] GYNECOLOGY  VISIT   HPI: 34 y.o.   Married  [place] American  female   [phone] with Patient's last menstrual period was 01/07/2015 (exact date).   here for follow up visit.  Patient states her migraines were better for 4-6 weeks after IUD was removed but have returned. She had her mirena removed at the end on 8/16, she was feeling great for about a month, then her migraines came back (not as severe). She still has intermittent deep dyspareunia, a little better since having the IUD out, thinks she is having gas. Using condoms for contraception.  She is being evaluated by cardiology for chest pain and palpitations. Saw neurology, had a negative EMG of her arm, no formal consult.  Her migraines are her biggest c/o.  She is due for an annual. Menses q 3-4 weeks x 5-6 days. Saturating a super tampon in 3-4 hours. No BTB, mild cramps. Sexually active, same partner x 17 years. She doesn't want any more kids, but her partner does.   GYNECOLOGIC HISTORY: Patient's last menstrual period was 01/07/2015 (exact date). Contraception:condoms everytime Menopausal hormone therapy: n/a Labs and immunizations with primary MD Last pap in 9/15, negative, negative HR HPV H/O abnormal paps: yes but f/u was normal H/O chlamydia         OB History    Gravida Para Term Preterm AB TAB SAB Ectopic Multiple Living   7 3 3  4  3   3          Patient Active Problem List   Diagnosis Date Noted  . 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 Loss of weight 09/21/2014  . Acute cystitis 09/16/2014  . Pain of both breasts 03/31/2014  . Hypertriglyceridemia 05/15/2013  . Frequent headaches 04/15/2013    Past Medical History  Diagnosis Date  . Chicken pox   . Frequent headaches   . Arrhythmia   . H/O hematuria   . Nocturnal headaches   . Heart palpitations   . Anxiety   . GERD (gastroesophageal reflux disease)   . Genital warts     Past  Surgical History  Procedure Laterality Date  . No past surgeries      Current Outpatient Prescriptions  Medication Sig Dispense Refill  . acetaminophen (TYLENOL) 500 MG tablet Take 1,000 mg by mouth every 6 (six) hours as needed for mild pain.    Marland Kitchen aspirin-acetaminophen-caffeine (EXCEDRIN MIGRAINE) 250-250-65 MG tablet Take 1 tablet by mouth every 6 (six) hours as needed for headache.    . ibuprofen (ADVIL,MOTRIN) 200 MG tablet Take 400 mg by mouth as needed for moderate pain.      No current facility-administered medications for this visit.     ALLERGIES: Review of patient's allergies indicates no known allergies.  Family History  Problem Relation Age of Onset  . Hyperlipidemia Mother   . Hypertension Mother   . Drug abuse Father   . Arthritis Maternal Grandmother   . Hyperlipidemia Maternal Grandmother   . Heart disease Maternal Grandmother   . Hypertension Maternal Grandmother   . Diabetes Maternal Grandmother   . Heart disease Maternal Grandfather   . Other Neg Hx     pco    Social History   Social History  . Marital Status: Married    Spouse Name: N/A  . Number of Children: N/A  . Years of Education: N/A   Occupational History  . Not  on file.   Social History Main Topics  . Smoking status: Former Smoker    Types: Cigarettes  . Smokeless tobacco: Never Used  . Alcohol Use: 0.0 oz/week    0 Standard drinks or equivalent per week     Comment: occasional  . Drug Use: No     Comment: former (quit 2007)  . Sexual Activity:    Partners: Male    Birth Control/ Protection: Condom   Other Topics Concern  . Not on file   Social History Narrative  Only occasional ETOH, kids are 20, 7 and 2. Homemaker and real estate business.   Review of Systems  Neurological: Positive for headaches.  All other systems reviewed and are negative.   PHYSICAL EXAMINATION:    BP 122/76 mmHg  Pulse 70  Wt 170 lb (77.111 kg)  LMP 01/07/2015 (Exact Date)    General  appearance: alert, cooperative and appears stated age Head: Normocephalic, without obvious abnormality, atraumatic Neck: no adenopathy, supple, symmetrical, trachea midline and thyroid normal to inspection and palpation Lungs: clear to auscultation bilaterally Breasts: normal appearance, no masses or tenderness Heart: regular rate and rhythm Abdomen: soft, non-tender; bowel sounds normal; no masses, no organomegaly Extremities: extremities normal, atraumatic, no cyanosis or edema Skin: Skin color, texture, turgor normal. No rashes or lesions Lymph nodes: Cervical, supraclavicular, and axillary nodes normal. No abnormal inguinal nodes palpated Neurologic: Grossly normal  Pelvic: External genitalia:  no lesions              Urethra:  normal appearing urethra with no masses, tenderness or lesions              Bartholins and Skenes: normal                 Vagina: normal appearing vagina with normal color and discharge, no lesions              Cervix: no lesions              Bimanual Exam:  Uterus:  normal size, contour, position, consistency, mobility, non-tender              Adnexa: no mass, fullness, tenderness              Rectovaginal: Yes.  .  Confirms.              Anus:  normal sphincter tone, no lesions  Chaperone was present for exam.  ASSESSMENT Gyn exam Contraception, has done better off of the Keller IUD Being evaluated by cardiology for chest pain and palpitations Worsening migraines H/O elevated lipids     PLAN No pap this month Refer to neurology for her migraine headaches She will continue to use condoms for now. After cardiac and neurology w/u will further discuss contraception Consider paragaurd IUD Fasting lipid profile, vit D Discussed breast self exam  An After Visit Summary was printed and given to the patient.

## 2015-01-13 NOTE — Patient Instructions (Addendum)
Your Doctor has ordered you to wear a heart monitor. You will wear this for 7 days.   TIPS -  REMINDERS 1. The sensor is the lanyard that is worn around your neck every day - this is powered by a battery that needs to be changed every day 2. The monitor is the device that allows you to record symptoms - this will need to be charged daily 3. The sensor & monitor need to be within 100 feet of each other at all times 4. The sensor connects to the electrodes (stickers) - these should be changed every 24-48 hours (you do not have to remove them when you bathe, just make sure they are dry when you connect it back to the sensor 5. If you need more supplies (electrodes, batteries), please call the 1-800 # on the back of the pamphlet and [employer] will mail you more supplies 6. If your skin becomes sensitive, please try the sample pack of sensitive skin electrodes (the white packet in your silver box) and call [employer] to have them mail you more of these type of electrodes 7. When you are finish wearing the monitor, please place all supplies back in the silver box, place the silver box in the pre-packaged UPS bag and drop off at [employer] or call them so they can come pick it up   Cardiac Event Monitoring A cardiac event monitor is a small recording device used to help detect abnormal heart rhythms (arrhythmias). The monitor is used to record heart rhythm when noticeable symptoms such as the following occur:  Fast heartbeats (palpitations), such as heart racing or fluttering.  Dizziness.  Fainting or light-headedness.  Unexplained weakness. The monitor is wired to two electrodes placed on your chest. Electrodes are flat, sticky disks that attach to your skin. The monitor can be worn for up to 30 days. You will wear the monitor at all times, except when bathing.  HOW TO USE YOUR CARDIAC EVENT MONITOR A technician will prepare your chest for the electrode placement. The technician will show you how to place  the electrodes, how to work the monitor, and how to replace the batteries. Take time to practice using the monitor before you leave the office. Make sure you understand how to send the information from the monitor to your health care provider. This requires a telephone with a landline, not a cell phone. You need to:  Wear your monitor at all times, except when you are in water:  Do not get the monitor wet.  Take the monitor off when bathing. Do not swim or use a hot tub with it on.  Keep your skin clean. Do not put body lotion or moisturizer on your chest.  Change the electrodes daily or any time they stop sticking to your skin. You might need to use tape to keep them on.  It is possible that your skin under the electrodes could become irritated. To keep this from happening, try to put the electrodes in slightly different places on your chest. However, they must remain in the area under your left breast and in the upper right section of your chest.  Make sure the monitor is safely clipped to your clothing or in a location close to your body that your health care provider recommends.  Press the button to record when you feel symptoms of heart trouble, such as dizziness, weakness, light-headedness, palpitations, thumping, shortness of breath, unexplained weakness, or a fluttering or racing heart. The monitor is always  on and records what happened slightly before you pressed the button, so do not worry about being too late to get good information.  Keep a diary of your activities, such as walking, doing chores, and taking medicine. It is especially important to note what you were doing when you pushed the button to record your symptoms. This will help your health care provider determine what might be contributing to your symptoms. The information stored in your monitor will be reviewed by your health care provider alongside your diary entries.  Send the recorded information as recommended by your  health care provider. It is important to understand that it will take some time for your health care provider to process the results.  Change the batteries as recommended by your health care provider. SEEK IMMEDIATE MEDICAL CARE IF:   You have chest pain.  You have extreme difficulty breathing or shortness of breath.  You develop a very fast heartbeat that persists.  You develop dizziness that does not go away.  You faint or constantly feel you are about to faint. Document Released: 12/06/2007 Document Revised: 07/13/2013 Document Reviewed: 08/25/2012 [facility] Patient Information 2015 [place], [place]. This information is not intended to replace advice given to you by your health care provider. Make sure you discuss any questions you have with your health care provider.  Dr. Debara Pickett has ordered a cardiac CT - this is done at [address] recommends that you schedule a follow-up appointment with Dr. Debara Pickett after your tests.

## 2015-01-13 NOTE — Patient Instructions (Signed)
Intrauterine Device Insertion Most often, an intrauterine device (IUD) is inserted into the uterus to prevent pregnancy. There are 2 types of IUDs available:  Copper IUD--This type of IUD creates an environment that is not favorable to sperm survival. The mechanism of action of the copper IUD is not known for certain. It can stay in place for 10 years.  Hormone IUD--This type of IUD contains the hormone progestin (synthetic progesterone). The progestin thickens the cervical mucus and prevents sperm from entering the uterus, and it also thins the uterine lining. There is no evidence that the hormone IUD prevents implantation. One hormone IUD can stay in place for up to 5 years, and a different hormone IUD can stay in place for up to 3 years. An IUD is the most cost-effective birth control if left in place for the full duration. It may be removed at any time. LET YOUR HEALTH CARE PROVIDER KNOW ABOUT:  Any allergies you have.  All medicines you are taking, including vitamins, herbs, eye drops, creams, and over-the-counter medicines.  Previous problems you or members of your family have had with the use of anesthetics.  Any blood disorders you have.  Previous surgeries you have had.  Possibility of pregnancy.  Medical conditions you have. RISKS AND COMPLICATIONS  Generally, intrauterine device insertion is a safe procedure. However, as with any procedure, complications can occur. Possible complications include:  Accidental puncture (perforation) of the uterus.  Accidental placement of the IUD either in the muscle layer of the uterus (myometrium) or outside the uterus. If this happens, the IUD can be found essentially floating around the bowels and must be taken out surgically.  The IUD may fall out of the uterus (expulsion). This is more common in women who have recently had a child.   Pregnancy in the fallopian tube (ectopic).  Pelvic inflammatory disease (PID), which is infection of  the uterus and fallopian tubes. The risk of PID is slightly increased in the first 20 days after the IUD is placed, but the overall risk is still very low. BEFORE THE PROCEDURE  Schedule the IUD insertion for when you will have your menstrual period or right after, to make sure you are not pregnant. Placement of the IUD is better tolerated shortly after a menstrual cycle.  You may need to take tests or be examined to make sure you are not pregnant.  You may be required to take a pregnancy test.  You may be required to get checked for sexually transmitted infections (STIs) prior to placement. Placing an IUD in someone who has an infection can make the infection worse.  You may be given a pain reliever to take 1 or 2 hours before the procedure.  An exam will be performed to determine the size and position of your uterus.  Ask your health care provider about changing or stopping your regular medicines. PROCEDURE   A tool (speculum) is placed in the vagina. This allows your health care provider to see the lower part of the uterus (cervix).  The cervix is prepped with a medicine that lowers the risk of infection.  You may be given a medicine to numb each side of the cervix (intracervical or paracervical block). This is used to block and control any discomfort with insertion.  A tool (uterine sound) is inserted into the uterus to determine the length of the uterine cavity and the direction the uterus may be tilted.  A slim instrument (IUD inserter) is inserted through the cervical   canal and into your uterus.  The IUD is placed in the uterine cavity and the insertion device is removed.  The nylon string that is attached to the IUD and used for eventual IUD removal is trimmed. It is trimmed so that it lays high in the vagina, just outside the cervix. AFTER THE PROCEDURE  You may have bleeding after the procedure. This is normal. It varies from light spotting for a few days to menstrual-like  bleeding.  You may have mild cramping.   This information is not intended to replace advice given to you by your health care provider. Make sure you discuss any questions you have with your health care provider.   Document Released: 10/25/2010 Document Revised: 12/17/2012 Document Reviewed: 08/17/2012 Elsevier Interactive Patient Education 2016 [place] AND DIET:  We recommended that you start or continue a regular exercise program for good health. Regular exercise means any activity that makes your heart beat faster and makes you sweat.  We recommend exercising at least 30 minutes per day at least 3 days a week, preferably 4 or 5.  We also recommend a diet low in fat and sugar.  Inactivity, poor dietary choices and obesity can cause diabetes, heart attack, stroke, and kidney damage, among others.    ALCOHOL AND SMOKING:  Women should limit their alcohol intake to no more than 7 drinks/beers/glasses of wine (combined, not each!) per week. Moderation of alcohol intake to this level decreases your risk of breast cancer and liver damage. And of course, no recreational drugs are part of a healthy lifestyle.  And absolutely no smoking or even second hand smoke. Most people know smoking can cause heart and lung diseases, but did you know it also contributes to weakening of your bones? Aging of your skin?  Yellowing of your teeth and nails?  CALCIUM AND VITAMIN D:  Adequate intake of calcium and Vitamin D are recommended.  The recommendations for exact amounts of these supplements seem to change often, but generally speaking 600 mg of calcium (either carbonate or citrate) and 800 units of Vitamin D per day seems prudent. Certain women may benefit from higher intake of Vitamin D.  If you are among these women, your doctor will have told you during your visit.    PAP SMEARS:  Pap smears, to check for cervical cancer or precancers,  have traditionally been done yearly, although recent  scientific advances have shown that most women can have pap smears less often.  However, every woman still should have a physical exam from her gynecologist every year. It will include a breast check, inspection of the vulva and vagina to check for abnormal growths or skin changes, a visual exam of the cervix, and then an exam to evaluate the size and shape of the uterus and ovaries.  And after 35 years of age, a rectal exam is indicated to check for rectal cancers. We will also provide age appropriate advice regarding health maintenance, like when you should have certain vaccines, screening for sexually transmitted diseases, bone density testing, colonoscopy, mammograms, etc.   MAMMOGRAMS:  All women over 58 years old should have a yearly mammogram. Many facilities now offer a "3D" mammogram, which may cost around $50 extra out of pocket. If possible,  we recommend you accept the option to have the 3D mammogram performed.  It both reduces the number of women who will be called back for extra views which then turn out to be normal, and it is  better than the routine mammogram at detecting truly abnormal areas.    COLONOSCOPY:  Colonoscopy to screen for colon cancer is recommended for all women at age 71.  We know, you hate the idea of the prep.  We agree, BUT, having colon cancer and not knowing it is worse!!  Colon cancer so often starts as a polyp that can be seen and removed at colonscopy, which can quite literally save your life!  And if your first colonoscopy is normal and you have no family history of colon cancer, most women don't have to have it again for 10 years.  Once every ten years, you can do something that may end up saving your life, right?  We will be happy to help you get it scheduled when you are ready.  Be sure to check your insurance coverage so you understand how much it will cost.  It may be covered as a preventative service at no cost, but you should check your particular policy.

## 2015-01-14 ENCOUNTER — Telehealth: Payer: Self-pay | Admitting: Internal Medicine

## 2015-01-14 LAB — VITAMIN D 25 HYDROXY (VIT D DEFICIENCY, FRACTURES): VIT D 25 HYDROXY: 31 ng/mL (ref 30–100)

## 2015-01-14 NOTE — Telephone Encounter (Signed)
Pt called in stating that she had a monitor placed yesterday for palpitations. Yesterday she said around 6pm she started to have some pain in her side that worked its way to her stomach. She drove herslf to the ER and upon her arrival, she vomited twice , which made her feel a bit better. She did not go in to the ER, she went back home and laid down. While laying down her heart started to race and so she took the monitor off and she has been feeling much better. Advise please  Thanks

## 2015-01-14 NOTE — Telephone Encounter (Signed)
Spoke to patient. reassured patient the monitor will not cause her symptoms she had last nights.  Discuss how to place the monitor back on and transmit recordings. If the abd pain come back seek medical attention  She verbalized understanding.

## 2015-01-18 ENCOUNTER — Encounter: Payer: Self-pay | Admitting: Internal Medicine

## 2015-01-18 NOTE — Addendum Note (Signed)
Addended by: Michele Mcalpine on: 01/18/2015 08:10 AM   Modules accepted: Orders

## 2015-01-20 ENCOUNTER — Telehealth: Payer: Self-pay | Admitting: Internal Medicine

## 2015-01-20 NOTE — Telephone Encounter (Signed)
Returned call to patient.She stated she wanted Dr.Hilty to know she has been wearing monitor for the last 7 days with no palpitations.Stated she is suppose to mail back today.Stated she has had a chest cold and has not been doing as much to cause palpitations.Durward Fortes Dr.Hilty not in office.I will send message to him.

## 2015-01-20 NOTE — Telephone Encounter (Signed)
Pt called in stating that the entire time she has been wearing the heart monitor , she didn't have any symptoms. She is not sure what she should do now. Please f/u with her  Thanks

## 2015-01-21 NOTE — Telephone Encounter (Signed)
I would have her turn it in. If she has another period of palpitations, we could re-order the monitor at that time. No point in continuing to wear it if she is not having symptoms.  Dr. Lemmie Evens

## 2015-01-21 NOTE — Telephone Encounter (Signed)
Returned call to patient.Dr.Hilty advised ok to turn monitor in.Advised of Dr.Hilty's recommendations.

## 2015-01-25 ENCOUNTER — Other Ambulatory Visit: Payer: Self-pay | Admitting: *Deleted

## 2015-01-25 DIAGNOSIS — R079 Chest pain, unspecified: Secondary | ICD-10-CM

## 2015-01-25 DIAGNOSIS — R002 Palpitations: Secondary | ICD-10-CM

## 2015-02-02 ENCOUNTER — Ambulatory Visit: Payer: BLUE CROSS/BLUE SHIELD | Admitting: Internal Medicine

## 2015-02-08 ENCOUNTER — Encounter (HOSPITAL_COMMUNITY): Payer: BLUE CROSS/BLUE SHIELD

## 2015-02-10 ENCOUNTER — Encounter: Payer: BLUE CROSS/BLUE SHIELD | Admitting: Cardiology

## 2015-02-14 ENCOUNTER — Ambulatory Visit: Payer: BLUE CROSS/BLUE SHIELD | Admitting: Neurology

## 2015-03-08 ENCOUNTER — Ambulatory Visit (INDEPENDENT_AMBULATORY_CARE_PROVIDER_SITE_OTHER): Payer: Self-pay | Admitting: Physician Assistant

## 2015-03-08 ENCOUNTER — Telehealth: Payer: Self-pay | Admitting: Internal Medicine

## 2015-03-08 VITALS — BP 110/74 | HR 82 | Temp 98.6°F | Resp 16 | Ht 64.35 in | Wt 169.6 lb

## 2015-03-08 DIAGNOSIS — L659 Nonscarring hair loss, unspecified: Secondary | ICD-10-CM

## 2015-03-08 DIAGNOSIS — R6889 Other general symptoms and signs: Secondary | ICD-10-CM

## 2015-03-08 DIAGNOSIS — G44209 Tension-type headache, unspecified, not intractable: Secondary | ICD-10-CM

## 2015-03-08 LAB — POCT URINALYSIS DIP (MANUAL ENTRY)
Bilirubin, UA: NEGATIVE
Glucose, UA: NEGATIVE
Ketones, POC UA: NEGATIVE
NITRITE UA: NEGATIVE
PH UA: 5.5
PROTEIN UA: NEGATIVE
Spec Grav, UA: 1.025
UROBILINOGEN UA: 0.2

## 2015-03-08 LAB — POCT CBC
Granulocyte percent: 62 %G (ref 37–80)
HEMATOCRIT: 37.6 % — AB (ref 37.7–47.9)
Hemoglobin: 12.7 g/dL (ref 12.2–16.2)
LYMPH, POC: 3.2 (ref 0.6–3.4)
MCH, POC: 28.6 pg (ref 27–31.2)
MCHC: 33.8 g/dL (ref 31.8–35.4)
MCV: 84.6 fL (ref 80–97)
MID (cbc): 0.4 (ref 0–0.9)
MPV: 7.5 fL (ref 0–99.8)
PLATELET COUNT, POC: 339 10*3/uL (ref 142–424)
POC GRANULOCYTE: 6 (ref 2–6.9)
POC LYMPH %: 33.4 % (ref 10–50)
POC MID %: 4.6 %M (ref 0–12)
RBC: 4.45 M/uL (ref 4.04–5.48)
RDW, POC: 12.2 %
WBC: 9.6 10*3/uL (ref 4.6–10.2)

## 2015-03-08 LAB — POC MICROSCOPIC URINALYSIS (UMFC): Mucus: ABSENT

## 2015-03-08 LAB — POCT GLYCOSYLATED HEMOGLOBIN (HGB A1C): HEMOGLOBIN A1C: 4.7

## 2015-03-08 LAB — POCT URINE PREGNANCY: PREG TEST UR: NEGATIVE

## 2015-03-08 MED ORDER — CYCLOBENZAPRINE HCL 10 MG PO TABS: 5.0000 mg | ORAL_TABLET | Freq: Three times a day (TID) | ORAL | Status: DC | PRN

## 2015-03-08 MED ORDER — MINOXIDIL 2 % EX SOLN: Freq: Two times a day (BID) | CUTANEOUS | Status: DC

## 2015-03-08 NOTE — Telephone Encounter (Signed)
Patient Name: Cassandra [name] DOB: 1981/02/27 Initial Comment Caller states has been getting cramps from her stomach and works its way towards her back for about 3 weeks but went away then she started feeling Light headed and nausea for 3-4 days Nurse Assessment Nurse: Vallery Sa, RN, [place] Date/Time (Eastern Time): 03/08/2015 11:27:57 AM Confirm and document reason for call. If symptomatic, describe symptoms. ---Caller states she developed dizziness and nausea about 3-4 days ago. No fever. No injury in the past 4 days. No severe breathing difficulty. No abdominal pain. Has the patient traveled out of the country within the last 30 days? ---No Does the patient have any new or worsening symptoms? ---Yes Will a triage be completed? ---Yes Related visit to physician within the last 2 weeks? ---No Does the PT have any chronic conditions? (i.e. diabetes, asthma, etc.) ---Yes List chronic conditions. ---Possible heart problems? Did the patient indicate they were pregnant? ---No Is this a behavioral health or substance abuse call? ---No Guidelines Guideline Title Affirmed Question Affirmed Notes Dizziness - Lightheadedness [1] MODERATE dizziness (e.g., interferes with normal activities) AND [2] has NOT been evaluated by physician for this (Exception: dizziness caused by heat exposure, sudden standing, or poor fluid intake) Final Disposition User See Physician within [place], RN, [place] wants to be seen today and there are no open appointments. She plans to go to [facility] Disagree/Comply: Comply

## 2015-03-08 NOTE — Progress Notes (Signed)
03/08/2015 9:20 PM   DOB: 06/12/1980 / MRN: [id number]  SUBJECTIVE:  Cassandra [name] is a 34 y.o. female presenting for multiple complaints, including HA, dizziness, chest pain, nausea, left arm numbness, hair loss and abdominal pain and cramping.   She was last seen on 10/22/14 for similar complaints by me and SSRI therapy was discussed given what appeared to be GAD.  She never started Lexapro.  She has since been to the ED for chest pain and work up was negative.  She is now under the care of a cardiologist and is to receive a stress test in two days. Dr. Debara Pickett did order a Holter Monitor and patient complied initially, however had an episode of nausea and apparently went to the ED, vomited twice and took the monitor off and felt better. She did reapply the monitor but had no symptoms, which she attributes to a chest cold and not being active.  Dr. Debara Pickett discharged the monitor after this.  She complains of left arm numbness today and this has also been evaluated by neurology and has had negative nerve conduction studies. She is also managed by OB/GYN who recently removed her Mirena IUD as it was implicated in her Migraine HAs, and per the ob's note it appears her headaches improved with removal, however she continues to complain of dull and achy occipital and frontal HA tonight. She has tried Ibuprofen 400 q6 and tylenol 650 q8 with good relief of this.     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  She was advised that her testosterone was slightly high.  She does have a sister with PCOS.  She did not comply with specialist reqs regarding her hair. She has picked up an OTC preparation of vitamins and shampoo which she hopes will improve her hair lose.   She denies abnormal vaginal discharge and denies dysuria, frequency, and urgency. Reports her period ended yesterday.   She is stooling normally for her, last output today and normal.  Denies focal abdominal pain.    She has No Known Allergies.   She  has a past medical history of Chicken pox; Frequent headaches; Arrhythmia; H/O hematuria; Nocturnal headaches; Heart palpitations; Anxiety; GERD (gastroesophageal reflux disease); Genital warts; and H/O chlamydia infection.    She  reports that she has quit smoking. Her smoking use included Cigarettes. She has never used smokeless tobacco. She reports that she drinks alcohol. She reports that she does not use illicit drugs. She  reports that she currently engages in sexual activity and has had female partners. She reports using the following method of birth control/protection: Condom. The patient  has past surgical history that includes No past surgeries.  Her family history includes Arthritis in her maternal grandmother; Diabetes in her maternal grandmother; Drug abuse in her father; Heart disease in her maternal grandfather and maternal grandmother; Hyperlipidemia in her maternal grandmother, mother, and sister; Hypertension in her maternal grandmother and mother. There is no history of Other.  ROS  Per HPI  Problem list and medications reviewed and updated by myself where necessary, and exist elsewhere in the encounter.   OBJECTIVE:  BP 110/74 mmHg  Pulse 82  Temp(Src) 98.6 F (37 C) (Oral)  Resp 16  Ht 5' 4.35" (1.634 m)  Wt 169 lb 9.6 oz (76.93 kg)  BMI 28.81 kg/m2  SpO2 97%  LMP 03/01/2015  Physical Exam  Constitutional: She is oriented to person, place, and time. She appears well-developed and well-nourished. No distress.  Eyes: EOM are normal. Pupils are equal, round, and reactive to light.  Cardiovascular: Normal rate, regular rhythm, normal heart sounds and intact distal pulses.  Exam reveals no gallop and no friction rub.   No murmur heard. Pulmonary/Chest: Effort normal and breath sounds normal. No respiratory distress. She has no wheezes. She  has no rales. She exhibits no tenderness.  Abdominal: Soft. Bowel sounds are normal. She exhibits no distension and no mass. There is no tenderness. There is no rebound and no guarding.  Musculoskeletal: Normal range of motion.  Neurological: She is alert and oriented to person, place, and time. No cranial nerve deficit. Gait normal.  Skin: Skin is warm and dry. She is not diaphoretic.  Psychiatric: Judgment normal. Her mood appears anxious. Her speech is not rapid and/or pressured. Cognition and memory are normal.  She perseverates on life threatening illness, with particular concern for her heart.   Vitals reviewed.   Results for orders placed or performed in visit on 03/08/15 (from the past 48 hour(s))  POCT CBC     Status: Abnormal   Collection Time: 03/08/15  9:17 PM  Result Value Ref Range   WBC 9.6 4.6 - 10.2 K/uL   Lymph, poc 3.2 0.6 - 3.4   POC LYMPH PERCENT 33.4 10 - 50 %L   MID (cbc) 0.4 0 - 0.9   POC MID % 4.6 0 - 12 %M   POC Granulocyte 6.0 2 - 6.9   Granulocyte percent 62.0 37 - 80 %G   RBC 4.45 4.04 - 5.48 M/uL   Hemoglobin 12.7 12.2 - 16.2 g/dL   HCT, POC 37.6 (A) 37.7 - 47.9 %   MCV 84.6 80 - 97 fL   MCH, POC 28.6 27 - 31.2 pg   MCHC 33.8 31.8 - 35.4 g/dL   RDW, POC 12.2 %   Platelet Count, POC 339 142 - 424 K/uL   MPV 7.5 0 - 99.8 fL  POCT glycosylated hemoglobin (Hb A1C)     Status: None   Collection Time: 03/08/15  9:17 PM  Result Value Ref Range   Hemoglobin A1C 4.7   POCT urinalysis dipstick     Status: Abnormal   Collection Time: 03/08/15  9:17 PM  Result Value Ref Range   Color, UA yellow yellow   Clarity, UA clear clear   Glucose, UA negative negative   Bilirubin, UA negative negative   Ketones, POC UA negative negative   Spec Grav, UA 1.025    Blood, UA trace-intact (A) negative   pH, UA 5.5    Protein Ur, POC negative negative   Urobilinogen, UA 0.2    Nitrite, UA Negative Negative   Leukocytes, UA Trace (A) Negative  POCT urine pregnancy      Status: None   Collection Time: 03/08/15  9:17 PM  Result Value Ref Range   Preg Test, Ur Negative Negative  POCT Microscopic Urinalysis ([facility])     Status: Abnormal   Collection Time: 03/08/15  9:17 PM  Result Value Ref Range   WBC,UR,HPF,POC Few (A) None WBC/hpf   RBC,UR,HPF,POC Few (A) None RBC/hpf   Bacteria Few (A) None, Too numerous to count   Mucus Absent Absent   Epithelial Cells, UR Per Microscopy Moderate (A) None, Too numerous to count cells/hpf    ASSESSMENT AND PLAN  Avarae was seen today for nausea,  dizziness, fatigue, alopecia and numbness.  Diagnoses and all orders for this visit:  Somatic complaints, multiple: If her cardiac workup is negative she may benefit from a workup for PCOS given hair loss.  I plan to see her back in roughly 3 weeks by appointment.  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  She lives in constant fear of her health an is convinced that something is wrong with her heart  She has compliance issues, which one has to wonder if this could be another manifestation of her worry and/or lack of insight.   -     POCT CBC -     POCT glycosylated hemoglobin (Hb A1C) -     POCT urinalysis dipstick -     POCT urine pregnancy -     POCT Microscopic Urinalysis ([facility]) -     COMPLETE METABOLIC PANEL WITH GFR -     Sedimentation Rate -     C-reactive protein  Hair loss -     minoxidil (ROGAINE) 2 % external solution; Apply topically 2 (two) times daily. Apply 1 mL in the morning and night.    The patient was advised to call or return to clinic if she does not see an improvement in symptoms or to seek the care of the closest emergency department if she worsens with the above plan.   Philis Fendt, MHS, PA-C [facility] 03/08/2015 9:20  PM

## 2015-03-08 NOTE — Telephone Encounter (Signed)
Ok to go to [facility]

## 2015-03-09 ENCOUNTER — Ambulatory Visit (HOSPITAL_COMMUNITY): Payer: BLUE CROSS/BLUE SHIELD

## 2015-03-09 ENCOUNTER — Telehealth (HOSPITAL_COMMUNITY): Payer: Self-pay

## 2015-03-09 LAB — COMPLETE METABOLIC PANEL WITH GFR
ALBUMIN: 4.3 g/dL (ref 3.6–5.1)
ALK PHOS: 61 U/L (ref 33–115)
ALT: 11 U/L (ref 6–29)
AST: 12 U/L (ref 10–30)
BUN: 13 mg/dL (ref 7–25)
CALCIUM: 9.1 mg/dL (ref 8.6–10.2)
CO2: 25 mmol/L (ref 20–31)
Chloride: 104 mmol/L (ref 98–110)
Creat: 0.57 mg/dL (ref 0.50–1.10)
Glucose, Bld: 99 mg/dL (ref 65–99)
POTASSIUM: 3.9 mmol/L (ref 3.5–5.3)
Sodium: 139 mmol/L (ref 135–146)
Total Bilirubin: 0.3 mg/dL (ref 0.2–1.2)
Total Protein: 6.7 g/dL (ref 6.1–8.1)

## 2015-03-09 LAB — C-REACTIVE PROTEIN: CRP: 0.9 mg/dL — AB (ref <0.60)

## 2015-03-09 NOTE — Telephone Encounter (Signed)
Encounter complete. 

## 2015-03-10 ENCOUNTER — Telehealth: Payer: Self-pay

## 2015-03-10 ENCOUNTER — Ambulatory Visit (HOSPITAL_COMMUNITY)
Admission: RE | Admit: 2015-03-10 | Discharge: 2015-03-10 | Disposition: A | Discharge status: Home / Self Care | Payer: Self-pay | Source: Ambulatory Visit | Attending: Cardiovascular Disease | Admitting: Cardiovascular Disease

## 2015-03-10 DIAGNOSIS — R002 Palpitations: Secondary | ICD-10-CM

## 2015-03-10 DIAGNOSIS — R079 Chest pain, unspecified: Secondary | ICD-10-CM | POA: Insufficient documentation

## 2015-03-10 LAB — EXERCISE TOLERANCE TEST
CSEPED: 10 min
CSEPEDS: 0 s
CSEPEW: 11.7 METS
CSEPPHR: 187 {beats}/min
MPHR: 185 {beats}/min
Percent HR: 101 %
RPE: 17
Rest HR: 81 {beats}/min

## 2015-03-10 LAB — SEDIMENTATION RATE: SED RATE: 5 mm/h (ref 0–20)

## 2015-03-10 NOTE — Telephone Encounter (Signed)
Pt was recently given Rx for Flexeril ad she was worried that she has a heart problem and that it will be a problem for her to take because she might have a heart problem that is causing her palpitations. Pt was educated that she is not diagnosed with a heart problem and that the MD would not have given it to her if he was worried about it causing her more problems. Pt reminded it is best to take medication when at home and see how it helps her and if she does not want to take then it is up to her.  Pt reminded of upcoming appt on 1/10 with Dr. Debara Pickett to review test CTA and GXT. Pt verbalized understanding no additional questions at this time.

## 2015-03-15 ENCOUNTER — Telehealth: Payer: Self-pay

## 2015-03-15 ENCOUNTER — Ambulatory Visit (HOSPITAL_COMMUNITY): Admission: RE | Admit: 2015-03-15 | Payer: Self-pay | Source: Ambulatory Visit

## 2015-03-15 NOTE — Telephone Encounter (Signed)
Pt calling about labs °

## 2015-03-16 ENCOUNTER — Encounter: Payer: Self-pay | Admitting: Internal Medicine

## 2015-03-16 NOTE — Telephone Encounter (Signed)
Please advise that her labs look great and there is nothing significant and nothing to act on medically.  I have also reviewed her stress test performed by Dr. Debara Pickett and this looks wonderful and reassuring.  I look forward to seeing her back in the office in roughly 2 weeks.  Philis Fendt, MS, PA-C 10:05 AM, 03/16/2015

## 2015-03-16 NOTE — Telephone Encounter (Signed)
LMOM of info

## 2015-03-17 ENCOUNTER — Ambulatory Visit: Payer: BLUE CROSS/BLUE SHIELD | Admitting: Neurology

## 2015-03-20 ENCOUNTER — Emergency Department (HOSPITAL_COMMUNITY)
Admission: EM | Admit: 2015-03-20 | Discharge: 2015-03-20 | Disposition: A | Discharge status: Home / Self Care | Payer: BLUE CROSS/BLUE SHIELD | Source: Home / Self Care | Attending: Emergency Medicine | Admitting: Emergency Medicine

## 2015-03-20 ENCOUNTER — Encounter (HOSPITAL_COMMUNITY): Payer: Self-pay | Admitting: Emergency Medicine

## 2015-03-20 DIAGNOSIS — N390 Urinary tract infection, site not specified: Secondary | ICD-10-CM | POA: Diagnosis not present

## 2015-03-20 LAB — POCT URINALYSIS DIP (DEVICE)
Glucose, UA: NEGATIVE mg/dL
Ketones, ur: NEGATIVE mg/dL
NITRITE: NEGATIVE
PH: 6 (ref 5.0–8.0)
PROTEIN: NEGATIVE mg/dL
UROBILINOGEN UA: 1 mg/dL (ref 0.0–1.0)

## 2015-03-20 LAB — POCT PREGNANCY, URINE: Preg Test, Ur: NEGATIVE

## 2015-03-20 MED ORDER — CEPHALEXIN 250 MG PO CAPS: 250.0000 mg | ORAL_CAPSULE | Freq: Three times a day (TID) | ORAL | Status: DC

## 2015-03-20 NOTE — ED Provider Notes (Signed)
CSN: [id number]     Arrival date & time 03/20/15  1522 History   First MD Initiated Contact with Patient 03/20/15 1610     Chief Complaint  Patient presents with  . Urinary Tract Infection   (Consider location/radiation/quality/duration/timing/severity/associated sxs/prior Treatment) HPI History obtained from patient:   LOCATION: urinary tract SEVERITY:2 DURATION:1 day CONTEXT: freq, urgency, dribbling urine QUALITY:burning MODIFYING FACTORS:none ASSOCIATED SYMPTOMS:dribbling of urine TIMING:now constant    Past Medical History  Diagnosis Date  . Chicken pox   . Frequent headaches   . Arrhythmia   . H/O hematuria   . Nocturnal headaches   . Heart palpitations   . Anxiety   . GERD (gastroesophageal reflux disease)   . Genital warts   . H/O chlamydia infection    Past Surgical History  Procedure Laterality Date  . No past surgeries     Family History  Problem Relation Age of Onset  . Hyperlipidemia Mother   . Hypertension Mother   . Drug abuse Father   . Arthritis Maternal Grandmother   . Hyperlipidemia Maternal Grandmother   . Heart disease Maternal Grandmother   . Hypertension Maternal Grandmother   . Diabetes Maternal Grandmother   . Heart disease Maternal Grandfather   . Other Neg Hx     pco  . Hyperlipidemia Sister    Social History  Substance Use Topics  . Smoking status: Former Smoker    Types: Cigarettes  . Smokeless tobacco: Never Used  . Alcohol Use: 0.0 oz/week    0 Standard drinks or equivalent per week     Comment: occasional   OB History    Gravida Para Term Preterm AB TAB SAB Ectopic Multiple Living   7 3 3  4  3   3      Review of Systems ROS +'ve Dysuria  Denies: HEADACHE, NAUSEA, ABDOMINAL PAIN, CHEST PAIN, CONGESTION,  SHORTNESS OF BREATH  Allergies  Review of patient's allergies indicates no known allergies.  Home Medications   Prior to Admission medications   Medication Sig Start Date End Date Taking? Authorizing Provider   acetaminophen (TYLENOL) 500 MG tablet Take 1,000 mg by mouth every 6 (six) hours as needed for mild pain. Reported on 03/08/2015    Historical Provider, MD  aspirin-acetaminophen-caffeine (EXCEDRIN MIGRAINE) [phone] MG tablet Take 1 tablet by mouth every 6 (six) hours as needed for headache. Reported on 03/08/2015    Historical Provider, MD  cephALEXin (KEFLEX) 250 MG capsule Take 1 capsule (250 mg total) by mouth 3 (three) times daily. 03/20/15   Konrad Felix, PA  cyclobenzaprine (FLEXERIL) 10 MG tablet Take 0.5-1 tablets (5-10 mg total) by mouth 3 (three) times daily as needed for muscle spasms. 03/08/15   Tereasa Coop, PA-C  ibuprofen (ADVIL,MOTRIN) 200 MG tablet Take 400 mg by mouth as needed for moderate pain.     Historical Provider, MD  minoxidil (ROGAINE) 2 % external solution Apply topically 2 (two) times daily. Apply 1 mL in the morning and night. 03/08/15   Tereasa Coop, PA-C  UNABLE TO FIND Vitamin for hair loss    Historical Provider, MD   Meds Ordered and Administered this Visit  Medications - No data to display  BP 127/81 mmHg  Pulse 75  Temp(Src) 99 F (37.2 C) (Oral)  Resp 16  SpO2 97%  LMP 03/01/2015 No data found.   Physical Exam  Constitutional: She is oriented to person, place, and time. She appears well-developed and well-nourished. No distress.  HENT:  Head: Normocephalic and atraumatic.  Pulmonary/Chest: Effort normal and breath sounds normal. No respiratory distress. She has no wheezes.  Abdominal: Soft. Bowel sounds are normal. There is no tenderness.  No CVA-T  Neurological: She is alert and oriented to person, place, and time.  Skin: Skin is warm and dry.  Psychiatric: She has a normal mood and affect. Her behavior is normal. Judgment and thought content normal.  Nursing note and vitals reviewed.   ED Course  Procedures (including critical care time)  Labs Review Labs Reviewed  POCT URINALYSIS DIP (DEVICE) - Abnormal; Notable for the  following:    Bilirubin Urine SMALL (*)    Hgb urine dipstick MODERATE (*)    Leukocytes, UA SMALL (*)    All other components within normal limits  POCT PREGNANCY, URINE    Imaging Review No results found.   Visual Acuity Review  Right Eye Distance:   Left Eye Distance:   Bilateral Distance:    Right Eye Near:   Left Eye Near:    Bilateral Near:         MDM   1. UTI (lower urinary tract infection)    Discussion of UA with patient. \Patient is advised to continue home symptomatic treatment. Prescription for keflex sent pharmacy patient has indicated. Patient is advised that if there are new or worsening symptoms or attend the emergency department, or contact primary care provider. Instructions of care provided discharged home in stable condition.  THIS NOTE WAS GENERATED USING A VOICE RECOGNITION SOFTWARE PROGRAM. ALL REASONABLE EFFORTS  WERE MADE TO PROOFREAD THIS DOCUMENT FOR ACCURACY.     Konrad Felix, [place] 03/20/15 1720

## 2015-03-20 NOTE — Discharge Instructions (Signed)

## 2015-03-20 NOTE — ED Notes (Signed)
Pt d/c by Linde Gillis, PA

## 2015-03-20 NOTE — ED Notes (Signed)
C/ot UTI sx onset yest associated w/urinary freq, abd/back pain Denies dysuria, fevers, chills A&O x4... No acute distress.

## 2015-03-22 ENCOUNTER — Ambulatory Visit: Payer: BLUE CROSS/BLUE SHIELD | Admitting: Internal Medicine

## 2015-03-24 ENCOUNTER — Ambulatory Visit (INDEPENDENT_AMBULATORY_CARE_PROVIDER_SITE_OTHER): Payer: BLUE CROSS/BLUE SHIELD | Admitting: Neurology

## 2015-03-24 ENCOUNTER — Encounter (INDEPENDENT_AMBULATORY_CARE_PROVIDER_SITE_OTHER): Payer: Self-pay

## 2015-03-24 ENCOUNTER — Encounter: Payer: Self-pay | Admitting: Neurology

## 2015-03-24 VITALS — BP 128/80 | HR 91 | Ht 64.35 in | Wt 168.8 lb

## 2015-03-24 DIAGNOSIS — R42 Dizziness and giddiness: Secondary | ICD-10-CM | POA: Diagnosis not present

## 2015-03-24 DIAGNOSIS — R202 Paresthesia of skin: Secondary | ICD-10-CM | POA: Insufficient documentation

## 2015-03-24 DIAGNOSIS — M542 Cervicalgia: Secondary | ICD-10-CM | POA: Diagnosis not present

## 2015-03-24 DIAGNOSIS — G35 Multiple sclerosis: Secondary | ICD-10-CM

## 2015-03-24 DIAGNOSIS — R51 Headache: Secondary | ICD-10-CM | POA: Diagnosis not present

## 2015-03-24 DIAGNOSIS — H0289 Other specified disorders of eyelid: Secondary | ICD-10-CM | POA: Insufficient documentation

## 2015-03-24 DIAGNOSIS — H539 Unspecified visual disturbance: Secondary | ICD-10-CM | POA: Insufficient documentation

## 2015-03-24 DIAGNOSIS — H5713 Ocular pain, bilateral: Secondary | ICD-10-CM

## 2015-03-24 DIAGNOSIS — R519 Headache, unspecified: Secondary | ICD-10-CM | POA: Insufficient documentation

## 2015-03-24 DIAGNOSIS — R200 Anesthesia of skin: Secondary | ICD-10-CM | POA: Insufficient documentation

## 2015-03-24 MED ORDER — GABAPENTIN 300 MG PO CAPS: 300.0000 mg | ORAL_CAPSULE | Freq: Three times a day (TID) | ORAL | Status: DC

## 2015-03-24 NOTE — Progress Notes (Signed)
[facility]    Provider:  Dr Jaynee Eagles Referring Provider: Jearld Fenton, NP Primary Care Physician:  Webb Silversmith, NP  CC:  Migraines  HPI:  Cassandra [name] is a 35 y.o. female here as a referral from Dr. Garnette Gunner for headches. Headaches started worsening in 2015. She had a Mirena placed in 2013 and started headaches at that time. Previous no history of headaches, no FHx or migraines. In 2015 she had the Mirena removed and it helped but still getting headaches. She is also having continuous numbness in the left arm. She also has numbness in the right arm and legs occ but mostly the left arm. The left arm also tingles. She was also having chest pains and she has been ongoing a complete cardiac workup, so far is good. The left arm symptoms are continuous. She is having temple pain and jaw numbness like she just had novacaine, been to the ED multiple times.She has lightheadedness and nausea, no vomiting. She has pressure in the back of the head. She has vision changes. She has pressure behind the eyes. She has tightness in the cervical muscles and a massage caused headache and left arm tingling, she has pain in the neck and pressure. She has radiation of tingling into the arm. She has pressure in the forehead and in the back of the head, she feels pressure all over, can be severe, happens daily for hours. Nothing helps.  No light sensitivity, no noise sensitivity. No hearing changes. She has shooting pains in the left side of the head. No weakness. No other focal neuro problems.  Reviewed notes, labs and imaging from outside physicians, which showed:  CBC and CMP unremarkable.   EMG/NCS in September: Personally reviewed data and waveforms which showed a normal left upper extremity.  Review of Systems: Patient complains of symptoms per HPI as well as the following symptoms: weight loss, chest pain, headache, numbness, cramps, anxiety, moles. Pertinent negatives per HPI. All others  negative.   Social History   Social History  . Marital Status: Married    Spouse Name: Jamel  . Number of Children: 3  . Years of Education: 11   Occupational History  . Not on file.   Social History Main Topics  . Smoking status: Never Smoker   . Smokeless tobacco: Never Used  . Alcohol Use: 0.0 oz/week    0 Standard drinks or equivalent per week     Comment: occasional  . Drug Use: No     Comment: former (quit 2007)  . Sexual Activity:    Partners: Male    Birth Control/ Protection: Condom   Other Topics Concern  . Not on file   Social History Narrative   Epworth Sleepiness Scale = 2 (01/13/2015)Lives with husband and 3 children   Caffeine use: 1-2 coffee per week.     Family History  Problem Relation Age of Onset  . Hyperlipidemia Mother   . Hypertension Mother   . Drug abuse Father   . Arthritis Maternal Grandmother   . Hyperlipidemia Maternal Grandmother   . [name] disease Maternal Grandmother   . Hypertension Maternal Grandmother   . Diabetes Maternal Grandmother   . [name] disease Maternal Grandfather   . Other Neg Hx     pco  . Migraines Neg Hx   . Hyperlipidemia Sister     Past Medical History  Diagnosis Date  . Chicken pox   . Frequent headaches   . Arrhythmia   . H/O  hematuria   . Nocturnal headaches   . [name] palpitations   . Anxiety   . GERD (gastroesophageal reflux disease)   . Genital warts   . H/O chlamydia infection     Past Surgical History  Procedure Laterality Date  . No past surgeries      Current Outpatient Prescriptions  Medication Sig Dispense Refill  . acetaminophen (TYLENOL) 500 MG tablet Take 1,000 mg by mouth every 6 (six) hours as needed for mild pain. Reported on 03/08/2015    . cephALEXin (KEFLEX) 250 MG capsule Take 1 capsule (250 mg total) by mouth 3 (three) times daily. 15 capsule 0  . cyclobenzaprine (FLEXERIL) 10 MG tablet Take 0.5-1 tablets (5-10 mg total) by mouth 3 (three) times daily as needed for muscle  spasms. 30 tablet 0  . ibuprofen (ADVIL,MOTRIN) 200 MG tablet Take 400 mg by mouth as needed for moderate pain.     . minoxidil (ROGAINE) 2 % external solution Apply topically 2 (two) times daily. Apply 1 mL in the morning and night. 60 mL 0  . UNABLE TO FIND Vitamin for hair loss    . aspirin-acetaminophen-caffeine (EXCEDRIN MIGRAINE) 250-250-65 MG tablet Take 1 tablet by mouth every 6 (six) hours as needed for headache. Reported on 03/24/2015    . gabapentin (NEURONTIN) 300 MG capsule Take 1 capsule (300 mg total) by mouth 3 (three) times daily. 90 capsule 11   No current facility-administered medications for this visit.    Allergies as of 03/24/2015  . (No Known Allergies)    Vitals: BP 128/80 mmHg  Pulse 91  Ht 5' 4.35" (1.634 m)  Wt 168 lb 12.8 oz (76.567 kg)  BMI 28.68 kg/m2  LMP 03/01/2015 Last Weight:  Wt Readings from Last 1 Encounters:  03/24/15 168 lb 12.8 oz (76.567 kg)   Last Height:   Ht Readings from Last 1 Encounters:  03/24/15 5' 4.35" (1.634 m)    Physical exam: Exam: Gen: NAD, conversant, well nourised, well groomed                     CV: RRR, no MRG. No Carotid Bruits. No peripheral edema, warm, nontender Eyes: Conjunctivae clear without exudates or hemorrhage  Neuro: Detailed Neurologic Exam  Speech:    Speech is normal; fluent and spontaneous with normal comprehension.  Cognition:    The patient is oriented to person, place, and time;     recent and remote memory intact;     language fluent;     normal attention, concentration,     fund of knowledge Cranial Nerves:    The pupils are equal, round, and reactive to light. The fundi are normal and spontaneous venous pulsations are present. Visual fields are full to finger confrontation. Extraocular movements are intact. Trigeminal sensation is intact and the muscles of mastication are normal. The face is symmetric. The palate elevates in the midline. Hearing intact. Voice is normal. Shoulder shrug is  normal. The tongue has normal motion without fasciculations.   Coordination:    Normal finger to nose and heel to shin. Normal rapid alternating movements.   Gait:    Heel-toe and tandem gait are normal.   Motor Observation:    No asymmetry, no atrophy, and no involuntary movements noted. Tone:    Normal muscle tone.    Posture:    Posture is normal. normal erect    Strength:    Strength is V/V in the upper and lower limbs.  Sensation: intact to LT     Reflex Exam:  DTR's:    Deep tendon reflexes in the upper and lower extremities are normal bilaterally.   Toes:    The toes are downgoing bilaterally.   Clonus:    Clonus is absent.      Assessment/Plan:  35 year old patient with headaches since placement of Mirena as well as paresthesias and numbness sin the face and extremities. Need to rule out intracranial process such as MS. 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 Neuro exam normal.    MRI of the brain and cervical cord Consider LP for opening pressure if imaging c/w IIH. 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 Continue Flexeril and try Neurontin.  CC: [facility]  Sarina Ill, MD  [facility] [address] [place], [place] [address]  Phone [phone] Fax [phone]

## 2015-03-24 NOTE — Patient Instructions (Addendum)
Remember to drink plenty of fluid, eat healthy meals and do not skip any meals. Try to eat protein with a every meal and eat a healthy snack such as fruit or nuts in between meals. Try to keep a regular sleep-wake schedule and try to exercise daily, particularly in the form of walking, 20-30 minutes a day, if you can.   As far as your medications are concerned, I would like to suggest: Continue Flexeril. Also suggest neurontin prn.  As far as diagnostic testing: MRi of the brain and cervical cord  Our phone number is [phone]. We also have an after hours call service for urgent matters and there is a physician on-call for urgent questions. For any emergencies you know to call 911 or go to the nearest emergency room

## 2015-03-30 ENCOUNTER — Ambulatory Visit (HOSPITAL_COMMUNITY): Payer: BLUE CROSS/BLUE SHIELD

## 2015-04-06 ENCOUNTER — Ambulatory Visit: Payer: Self-pay | Admitting: Physician Assistant

## 2015-04-07 ENCOUNTER — Ambulatory Visit (HOSPITAL_COMMUNITY)
Admission: RE | Admit: 2015-04-07 | Discharge: 2015-04-07 | Disposition: A | Discharge status: Home / Self Care | Payer: BLUE CROSS/BLUE SHIELD | Source: Ambulatory Visit | Attending: Internal Medicine | Admitting: Internal Medicine

## 2015-04-07 ENCOUNTER — Encounter (HOSPITAL_COMMUNITY): Payer: Self-pay

## 2015-04-07 DIAGNOSIS — R079 Chest pain, unspecified: Secondary | ICD-10-CM

## 2015-04-07 DIAGNOSIS — R002 Palpitations: Secondary | ICD-10-CM | POA: Insufficient documentation

## 2015-04-07 MED ORDER — METOPROLOL TARTRATE 1 MG/ML IV SOLN
INTRAVENOUS | Status: AC
  Filled 2015-04-07: qty 5

## 2015-04-07 MED ORDER — NITROGLYCERIN 0.4 MG SL SUBL
0.8000 mg | SUBLINGUAL_TABLET | Freq: Once | SUBLINGUAL | Status: AC
  Administered 2015-04-07: 0.8 mg via SUBLINGUAL

## 2015-04-07 MED ORDER — METOPROLOL TARTRATE 1 MG/ML IV SOLN
5.0000 mg | Freq: Once | INTRAVENOUS | Status: AC
  Administered 2015-04-07: 5 mg via INTRAVENOUS

## 2015-04-07 MED ORDER — IOHEXOL 350 MG/ML SOLN
80.0000 mL | Freq: Once | INTRAVENOUS | Status: AC | PRN
  Administered 2015-04-07: 80 mL via INTRAVENOUS

## 2015-04-07 MED ORDER — NITROGLYCERIN 0.4 MG SL SUBL
SUBLINGUAL_TABLET | SUBLINGUAL | Status: AC
  Filled 2015-04-07: qty 2

## 2015-04-07 MED ORDER — METOPROLOL TARTRATE 1 MG/ML IV SOLN
2.5000 mg | Freq: Once | INTRAVENOUS | Status: AC
  Administered 2015-04-07: 2.5 mg via INTRAVENOUS

## 2015-04-07 NOTE — Progress Notes (Signed)
CT scan completed. Tolerated well. D/C home walking with husband. Awake and alert. In no distress. 

## 2015-04-14 ENCOUNTER — Ambulatory Visit (INDEPENDENT_AMBULATORY_CARE_PROVIDER_SITE_OTHER): Payer: BLUE CROSS/BLUE SHIELD | Admitting: Internal Medicine

## 2015-04-14 ENCOUNTER — Encounter: Payer: Self-pay | Admitting: Internal Medicine

## 2015-04-14 VITALS — BP 98/58 | HR 68 | Ht 63.0 in | Wt 169.1 lb

## 2015-04-14 DIAGNOSIS — M542 Cervicalgia: Secondary | ICD-10-CM

## 2015-04-14 DIAGNOSIS — R079 Chest pain, unspecified: Secondary | ICD-10-CM | POA: Diagnosis not present

## 2015-04-14 NOTE — Patient Instructions (Signed)
Dr Hilty recommends that you follow-up with him as needed. 

## 2015-04-14 NOTE — Progress Notes (Signed)
OFFICE NOTE  Chief Complaint:  Follow-up testing  Primary Care Physician: Webb Silversmith, NP  HPI:  Cassandra [name] is a pleasant 35 yo female who has had several presentations to the ER for recurrent chest pain. It seems that her symptoms are worsening in frequency and severity. She was seen in the ER last week for squeezing chest pressure that came on without warning, seemed to radiate to her neck and was associated with pain down her left arm. EKG showed no ischemic changes and she ruled-out for acute MI.  She has few cardiac risk factors, however, was a former smoker. There is hypertension and dyslipidemia in her parents, but she has not been diagnosed with this. Her symptoms are not necessarily worse after eating or attributed to exercise or exertion. There are no clear alleviating factors. In the ER. She was given pepcid and a GI cocktail - she said it felt numbing and eventually, her symptoms went away. She has a history of either migraine or cluster headache - apparently worsened by the Mirena IUD which was explanted, but she still has some headaches, just not as severe. With regards to her left arm pain, she recently was seen by neurology and underwent NCS. Those results are pending.  Finally, she is describing palpitations as well. These occur mostly at night, but have been associated with the chest pain. They are also occurring more frequently, especially since her most recent ER visit. She says several times a week.  Cassandra [name] returns today for follow-up of testing. She underwent an exercise treadmill stress test which was negative for ischemia and good exercise effort. She wore a heart monitor for palpitations but this showed no extra beats in a sinus rhythm, despite her feeling palpitations during the study. Finally, I obtained a CT angiogram given her significant chest pain symptoms. This showed normal coronary arteries with no coronary artery calcium. There are no anomalous findings of  the coronaries. Overall very reassuring test.  PMHx:  Past Medical History  Diagnosis Date  . Chicken pox   . Frequent headaches   . Arrhythmia   . H/O hematuria   . Nocturnal headaches   . Heart palpitations   . Anxiety   . GERD (gastroesophageal reflux disease)   . Genital warts   . H/O chlamydia infection     Past Surgical History  Procedure Laterality Date  . No past surgeries      FAMHx:  Family History  Problem Relation Age of Onset  . Hyperlipidemia Mother   . Hypertension Mother   . Drug abuse Father   . Arthritis Maternal Grandmother   . Hyperlipidemia Maternal Grandmother   . Heart disease Maternal Grandmother   . Hypertension Maternal Grandmother   . Diabetes Maternal Grandmother   . Heart disease Maternal Grandfather   . Other Neg Hx     pco  . Migraines Neg Hx   . Hyperlipidemia Sister     SOCHx:   reports that she has never smoked. She has never used smokeless tobacco. She reports that she drinks alcohol. She reports that she does not use illicit drugs.  ALLERGIES:  No Known Allergies  ROS: Pertinent items noted in HPI and remainder of comprehensive ROS otherwise negative.  HOME MEDS: Current Outpatient Prescriptions  Medication Sig Dispense Refill  . acetaminophen (TYLENOL) 500 MG tablet Take 1,000 mg by mouth every 6 (six) hours as needed for mild pain. Reported on 03/08/2015    . aspirin-acetaminophen-caffeine ([place])  250-250-65 MG tablet Take 1 tablet by mouth every 6 (six) hours as needed for headache. Reported on 03/24/2015    . cyclobenzaprine (FLEXERIL) 10 MG tablet Take 0.5-1 tablets (5-10 mg total) by mouth 3 (three) times daily as needed for muscle spasms. 30 tablet 0  . gabapentin (NEURONTIN) 300 MG capsule Take 1 capsule (300 mg total) by mouth 3 (three) times daily. 90 capsule 11  . ibuprofen (ADVIL,MOTRIN) 200 MG tablet Take 400 mg by mouth as needed for moderate pain.     . minoxidil (ROGAINE) 2 % external solution  Apply topically 2 (two) times daily. Apply 1 mL in the morning and night. 60 mL 0  . UNABLE TO FIND Vitamin for hair loss     No current facility-administered medications for this visit.    LABS/IMAGING: No results found for this or any previous visit (from the past 48 hour(s)). No results found.  WEIGHTS: Wt Readings from Last 3 Encounters:  04/14/15 169 lb 1.6 oz (76.703 kg)  03/24/15 168 lb 12.8 oz (76.567 kg)  03/08/15 169 lb 9.6 oz (76.93 kg)    VITALS: BP 98/58 mmHg  Pulse 68  Ht 5\' 3"  (1.6 m)  Wt 169 lb 1.6 oz (76.703 kg)  BMI 29.96 kg/m2  LMP 02/27/2015  EXAM: Deferred  EKG: Deferred  ASSESSMENT: 1. Atypical chest pain-consider GI causes 2. Palpitations - no evidence of findings on monitoring 3. Normal coronary CT angiogram with a negative calcium score (03/2015)  PLAN: 1.   Cassandra [name] had a low risk stress test and palpitations but no evidence of extrasystoles or arrhythmias on her monitor. She had a normal coronary CT angiogram with a negative calcium score and no evidence of obstruction in the coronaries, therefore her chest pain is noncardiac. I told her with gray conference that she should look for other causes of chest pain which could be related to GI or musculoskeletal and/or neurologic.  Follow-up as needed.   Pixie Casino, MD, [facility] Attending Cardiologist [place] 04/14/2015, 12:59 PM

## 2015-04-27 ENCOUNTER — Telehealth: Payer: Self-pay | Admitting: Internal Medicine

## 2015-04-27 NOTE — Telephone Encounter (Signed)
Is requesting to transfer from [place] to [place].  Please advise.

## 2015-04-27 NOTE — Telephone Encounter (Signed)
Ok with me 

## 2015-04-30 NOTE — Telephone Encounter (Signed)
Ok with me 

## 2015-05-02 ENCOUNTER — Encounter: Payer: Self-pay | Admitting: Gastroenterology

## 2015-05-02 NOTE — Telephone Encounter (Signed)
Patient did not realize how far booked out Dr. Quay Burow was on transfers.  Dr. Ronnald Ramp took her spouse on as a patient and she is requesting him to take her on as a patient.  Please advise.

## 2015-05-02 NOTE — Telephone Encounter (Signed)
Ok with me 

## 2015-05-04 ENCOUNTER — Emergency Department (HOSPITAL_COMMUNITY)
Admission: EM | Admit: 2015-05-04 | Discharge: 2015-05-04 | Disposition: A | Discharge status: Home / Self Care | Payer: BLUE CROSS/BLUE SHIELD | Attending: Emergency Medicine | Admitting: Emergency Medicine

## 2015-05-04 ENCOUNTER — Encounter (HOSPITAL_COMMUNITY): Payer: Self-pay | Admitting: Cardiology

## 2015-05-04 ENCOUNTER — Emergency Department (HOSPITAL_COMMUNITY): Payer: BLUE CROSS/BLUE SHIELD

## 2015-05-04 DIAGNOSIS — Z79899 Other long term (current) drug therapy: Secondary | ICD-10-CM | POA: Insufficient documentation

## 2015-05-04 DIAGNOSIS — R0789 Other chest pain: Secondary | ICD-10-CM | POA: Diagnosis not present

## 2015-05-04 DIAGNOSIS — R070 Pain in throat: Secondary | ICD-10-CM | POA: Diagnosis not present

## 2015-05-04 DIAGNOSIS — Z8719 Personal history of other diseases of the digestive system: Secondary | ICD-10-CM | POA: Insufficient documentation

## 2015-05-04 DIAGNOSIS — Z8619 Personal history of other infectious and parasitic diseases: Secondary | ICD-10-CM | POA: Insufficient documentation

## 2015-05-04 DIAGNOSIS — G8929 Other chronic pain: Secondary | ICD-10-CM | POA: Diagnosis not present

## 2015-05-04 DIAGNOSIS — R1013 Epigastric pain: Secondary | ICD-10-CM | POA: Insufficient documentation

## 2015-05-04 DIAGNOSIS — R079 Chest pain, unspecified: Secondary | ICD-10-CM | POA: Diagnosis present

## 2015-05-04 DIAGNOSIS — Z8659 Personal history of other mental and behavioral disorders: Secondary | ICD-10-CM | POA: Insufficient documentation

## 2015-05-04 DIAGNOSIS — F419 Anxiety disorder, unspecified: Secondary | ICD-10-CM | POA: Insufficient documentation

## 2015-05-04 LAB — BASIC METABOLIC PANEL
ANION GAP: 11 (ref 5–15)
BUN: 12 mg/dL (ref 6–20)
CO2: 25 mmol/L (ref 22–32)
Calcium: 9.7 mg/dL (ref 8.9–10.3)
Chloride: 105 mmol/L (ref 101–111)
Creatinine, Ser: 0.58 mg/dL (ref 0.44–1.00)
GFR calc Af Amer: >60 mL/min (ref >60)
GLUCOSE: 97 mg/dL (ref 65–99)
POTASSIUM: 3.9 mmol/L (ref 3.5–5.1)
Sodium: 141 mmol/L (ref 135–145)

## 2015-05-04 LAB — CBC
HEMATOCRIT: 41.2 % (ref 36.0–46.0)
HEMOGLOBIN: 13.4 g/dL (ref 12.0–15.0)
MCH: 28 pg (ref 26.0–34.0)
MCHC: 32.5 g/dL (ref 30.0–36.0)
MCV: 86 fL (ref 78.0–100.0)
Platelets: 296 10*3/uL (ref 150–400)
RBC: 4.79 MIL/uL (ref 3.87–5.11)
RDW: 12.4 % (ref 11.5–15.5)
WBC: 6.7 10*3/uL (ref 4.0–10.5)

## 2015-05-04 LAB — I-STAT TROPONIN, ED: Troponin i, poc: 0 ng/mL (ref 0.00–0.08)

## 2015-05-04 MED ORDER — FAMOTIDINE 20 MG PO TABS: 20.0000 mg | ORAL_TABLET | Freq: Two times a day (BID) | ORAL | Status: DC

## 2015-05-04 MED ORDER — PANTOPRAZOLE SODIUM 40 MG PO TBEC
40.0000 mg | DELAYED_RELEASE_TABLET | Freq: Once | ORAL | Status: AC
  Administered 2015-05-04: 40 mg via ORAL
  Filled 2015-05-04: qty 1

## 2015-05-04 MED ORDER — PANTOPRAZOLE SODIUM 20 MG PO TBEC: 20.0000 mg | DELAYED_RELEASE_TABLET | Freq: Every day | ORAL | Status: DC

## 2015-05-04 MED ORDER — FAMOTIDINE 20 MG PO TABS
20.0000 mg | ORAL_TABLET | Freq: Once | ORAL | Status: AC
  Administered 2015-05-04: 20 mg via ORAL
  Filled 2015-05-04: qty 1

## 2015-05-04 MED ORDER — DIAZEPAM 5 MG PO TABS: 5.0000 mg | ORAL_TABLET | Freq: Every day | ORAL | Status: DC | PRN

## 2015-05-04 MED ORDER — GI COCKTAIL ~~LOC~~
30.0000 mL | Freq: Once | ORAL | Status: AC
  Administered 2015-05-04: 30 mL via ORAL
  Filled 2015-05-04: qty 30

## 2015-05-04 NOTE — ED Provider Notes (Signed)
CSN: [id number]     Arrival date & time 05/04/15  0911 History   First MD Initiated Contact with Patient 05/04/15 1318     Chief Complaint  Patient presents with  . Chest Pain     (Consider location/radiation/quality/duration/timing/severity/associated sxs/prior Treatment) HPI   Cassandra [name] is a 35 y.o. female, with a history of GERD, presenting to the ED with acute on chronic burning chest discomfort extending into the back of her throat that recurred 3 days ago. Patient states that she has had this discomfort for years. The discomfort is worse with lying down and with eating certain foods. Rates the pain 5 out of 10 extending from epigastric area to the back of her throat. Patient also endorses a "really bad taste" in her mouth. Patient states that she has been told that she might have GERD during her previous ED visits, but has not been officially evaluated or diagnosed. Patient states that she has an appointment with GI in April and an appointment with a new PCP at the beginning of March. Patient states that she presented today because she called the nurses line at her doctor's office, who told her to come to the ED right away because she might have "severe GERD burning a hole in the esophagus." Patient states that she is concerned that she may have an esophageal perforation. Patient states that she has tried Prevacid in the past without relief. Patient adds that she is also feeling chronic pain in her back and left shoulder, but this is not the focus of why she is here. Patient denies cough, nausea/vomiting, fever/chills, diaphoresis, hematemesis, abdominal pain, or any other complaints.    Past Medical History  Diagnosis Date  . Chicken pox   . Frequent headaches   . Arrhythmia   . H/O hematuria   . Nocturnal headaches   . Heart palpitations   . Anxiety   . GERD (gastroesophageal reflux disease)   . Genital warts   . H/O chlamydia infection    Past Surgical History  Procedure  Laterality Date  . No past surgeries     Family History  Problem Relation Age of Onset  . Hyperlipidemia Mother   . Hypertension Mother   . Drug abuse Father   . Arthritis Maternal Grandmother   . Hyperlipidemia Maternal Grandmother   . Heart disease Maternal Grandmother   . Hypertension Maternal Grandmother   . Diabetes Maternal Grandmother   . Heart disease Maternal Grandfather   . Other Neg Hx     pco  . Migraines Neg Hx   . Hyperlipidemia Sister    Social History  Substance Use Topics  . Smoking status: Never Smoker   . Smokeless tobacco: Never Used  . Alcohol Use: 0.0 oz/week    0 Standard drinks or equivalent per week     Comment: occasional   OB History    Gravida Para Term Preterm AB TAB SAB Ectopic Multiple Living   7 3 3  4  3   3      Review of Systems  Constitutional: Negative for fever, chills and diaphoresis.  Respiratory: Negative for cough.   Gastrointestinal: Negative for nausea and vomiting.       Burning discomfort from the epigastric region to the back of the throat.  Skin: Negative for pallor.  Neurological: Negative for headaches.  All other systems reviewed and are negative.     Allergies  Review of patient's allergies indicates no known allergies.  Home Medications  Prior to Admission medications   Medication Sig Start Date End Date Taking? Authorizing Provider  acetaminophen (TYLENOL) 500 MG tablet Take 1,000 mg by mouth every 6 (six) hours as needed for mild pain. Reported on 03/08/2015    Historical Provider, MD  aspirin-acetaminophen-caffeine (EXCEDRIN MIGRAINE) [phone] MG tablet Take 1 tablet by mouth every 6 (six) hours as needed for headache. Reported on 03/24/2015    Historical Provider, MD  cyclobenzaprine (FLEXERIL) 10 MG tablet Take 0.5-1 tablets (5-10 mg total) by mouth 3 (three) times daily as needed for muscle spasms. 03/08/15   Tereasa Coop, PA-C  diazepam (VALIUM) 5 MG tablet Take 1 tablet (5 mg total) by mouth daily  as needed for anxiety. Use for throat or chest pain for possible esophageal spasm. 05/04/15   Alisandra Son C Jaymason Ledesma, PA-C  famotidine (PEPCID) 20 MG tablet Take 1 tablet (20 mg total) by mouth 2 (two) times daily. 05/04/15   Marites Nath C Ruford Dudzinski, PA-C  gabapentin (NEURONTIN) 300 MG capsule Take 1 capsule (300 mg total) by mouth 3 (three) times daily. 03/24/15   Melvenia Beam, MD  ibuprofen (ADVIL,MOTRIN) 200 MG tablet Take 400 mg by mouth as needed for moderate pain.     Historical Provider, MD  minoxidil (ROGAINE) 2 % external solution Apply topically 2 (two) times daily. Apply 1 mL in the morning and night. 03/08/15   Tereasa Coop, PA-C  pantoprazole (PROTONIX) 20 MG tablet Take 1 tablet (20 mg total) by mouth daily. 05/04/15   Gale Hulse C Keyara Ent, PA-C  UNABLE TO FIND Vitamin for hair loss    Historical Provider, MD   BP 113/70 mmHg  Pulse 77  Temp(Src) 98.6 F (37 C) (Oral)  Resp 20  Wt 76.658 kg  SpO2 100%  LMP 04/24/2015 Physical Exam  Constitutional: She appears well-developed and well-nourished. No distress.  HENT:  Head: Normocephalic and atraumatic.  Mouth/Throat: Uvula is midline and oropharynx is clear and moist.  Eyes: Conjunctivae are normal. Pupils are equal, round, and reactive to light.  Neck: Normal range of motion. Neck supple.  No masses or swelling.  Cardiovascular: Normal rate, regular rhythm, normal heart sounds and intact distal pulses.   Pulmonary/Chest: Effort normal and breath sounds normal. No respiratory distress.  Some tenderness to the center and left chest. No instability, erythema, deformity, or crepitus.  Abdominal: Soft. Bowel sounds are normal. There is no tenderness. There is no guarding.  Musculoskeletal: She exhibits no edema or tenderness.  Full range of motion in her left shoulder.  Lymphadenopathy:    She has no cervical adenopathy.  Neurological: She is alert.  No sensory deficits in the left shoulder. Strength 5 out of 5.  Skin: Skin is warm and dry. She is not  diaphoretic.  Nursing note and vitals reviewed.   ED Course  Procedures (including critical care time) Labs Review Labs Reviewed  BASIC METABOLIC PANEL  [place], ED    Imaging Review Dg Chest 2 View  05/04/2015  CLINICAL DATA:  35 year old female with burning chest and abdominal pain with shortness of breath for 3 days. Initial encounter. EXAM: CHEST  2 VIEW COMPARISON:  Cardiac CTA 04/07/2015.  Chest radiographs 01/06/2015. FINDINGS: Normal lung volumes. Normal cardiac size and mediastinal contours. Visualized tracheal air column is within normal limits. Lung parenchyma is stable and clear. No pneumothorax or pleural effusion. Mild scoliotic curvature in the spine. No acute osseous abnormality identified. IMPRESSION: Negative, no acute cardiopulmonary abnormality. Electronically Signed   By: Lemmie Evens  [place] Crane M.D.   On: 05/04/2015 09:45   I have personally reviewed and evaluated these images and lab results as part of my medical decision-making.   EKG Interpretation None      MDM   Final diagnoses:  Burning chest pain  Throat pain    Cassandra [name] presents with acute on chronic chest burning extending from the epigastrium to the back of the throat recurring 3 days ago.  This patient's presentation is very much consistent with untreated GERD. Patient has no signs or symptoms of esophageal perforation. Patient is nontoxic appearing, afebrile, not tachycardic, not tachypneic, maintains SPO2 of 99-100% on room air, and is in no apparent distress. Patient has no signs of sepsis or other serious or life-threatening condition. No abnormalities on the patient's labs. EKG shows normal sinus rhythm. Patient had a negative stress test December 2016. Low suspicion for ACS. HEART score is 1, indicating low risk for a cardiac event. Wells criteria score is 0, indicating low risk for PE. Patient improved after a GI cocktail, Pepcid, and Protonix here in the ED. Patient was encouraged to keep  her appointments with both her new PCP and GI specialist. The patient was given instructions for home care as well as return precautions. Patient voices understanding of these instructions, accepts the plan, and is comfortable with discharge.  Note: Patient adds that she was able to get an earlier appointment with her GI specialist for March 2.  Filed Vitals:   05/04/15 0919 05/04/15 1123  BP: 124/88 113/70  Pulse: 90 77  Temp: 98.2 F (36.8 C) 98.6 F (37 C)  TempSrc: Oral Oral  Resp: 16 20  Weight: 76.658 kg   SpO2: 99% 100%     Lorayne Bender, PA-C 05/04/15 [place], MD 05/04/15 1600

## 2015-05-04 NOTE — ED Notes (Signed)
Pt reports she has been having chest pain for the past couple of years. Recently had a cardiac work up and everything was cleared. States she has also been having abd pain.

## 2015-05-04 NOTE — Discharge Instructions (Signed)
You have been seen today for burning in the chest. Your imaging and lab tests showed no abnormalities. Take Pepcid every day for 5 days. Take the Protonix every day 30 minutes before meals in the morning and evening. It can take many weeks of therapy before the effects of the Protonix will be felt. Continue to take this medication even if you feel better, unless you're told differently by your PCP or GI doctor. Keep your upcoming appointments with your PCP and with GI. Return to ED should symptoms worsen.

## 2015-05-04 NOTE — ED Notes (Signed)
Pt states that she has chest pain for years and then she saw her pcp( she did not like her) and was told she had GERD. She has had bad taste in mouth . Left shoulder pain and lower back pain she has just in the past 3 days started taking prevacid

## 2015-05-09 DIAGNOSIS — R202 Paresthesia of skin: Secondary | ICD-10-CM | POA: Diagnosis not present

## 2015-05-09 DIAGNOSIS — H539 Unspecified visual disturbance: Secondary | ICD-10-CM | POA: Diagnosis not present

## 2015-05-09 DIAGNOSIS — R51 Headache: Secondary | ICD-10-CM | POA: Diagnosis not present

## 2015-05-09 DIAGNOSIS — R42 Dizziness and giddiness: Secondary | ICD-10-CM | POA: Diagnosis not present

## 2015-05-09 DIAGNOSIS — M542 Cervicalgia: Secondary | ICD-10-CM | POA: Diagnosis not present

## 2015-05-10 ENCOUNTER — Ambulatory Visit (INDEPENDENT_AMBULATORY_CARE_PROVIDER_SITE_OTHER): Payer: Self-pay

## 2015-05-10 ENCOUNTER — Ambulatory Visit (INDEPENDENT_AMBULATORY_CARE_PROVIDER_SITE_OTHER): Payer: BLUE CROSS/BLUE SHIELD

## 2015-05-10 DIAGNOSIS — Z0289 Encounter for other administrative examinations: Secondary | ICD-10-CM

## 2015-05-10 DIAGNOSIS — R202 Paresthesia of skin: Secondary | ICD-10-CM

## 2015-05-10 DIAGNOSIS — H539 Unspecified visual disturbance: Secondary | ICD-10-CM

## 2015-05-10 DIAGNOSIS — R519 Headache, unspecified: Secondary | ICD-10-CM

## 2015-05-10 DIAGNOSIS — G35 Multiple sclerosis: Secondary | ICD-10-CM

## 2015-05-10 DIAGNOSIS — R200 Anesthesia of skin: Secondary | ICD-10-CM

## 2015-05-10 DIAGNOSIS — R51 Headache: Principal | ICD-10-CM

## 2015-05-10 DIAGNOSIS — M542 Cervicalgia: Secondary | ICD-10-CM

## 2015-05-10 DIAGNOSIS — R42 Dizziness and giddiness: Secondary | ICD-10-CM

## 2015-05-10 DIAGNOSIS — H0289 Other specified disorders of eyelid: Secondary | ICD-10-CM

## 2015-05-11 ENCOUNTER — Telehealth: Payer: Self-pay | Admitting: Neurology

## 2015-05-11 NOTE — Telephone Encounter (Signed)
Pt would like to get MRI results. Please all and advise [phone]

## 2015-05-11 NOTE — Telephone Encounter (Signed)
Called pt back. Advised results not ready yet. Will call her once results ready. She verbalized understanding.

## 2015-05-11 NOTE — Telephone Encounter (Signed)
-----   Message from Melvenia Beam, MD sent at 05/11/2015  5:09 PM EST ----- Both normal, thanks

## 2015-05-11 NOTE — Telephone Encounter (Signed)
Called and spoke to pt about normal MRI brain/cervical cord. Pt verbalized understanding.

## 2015-05-19 ENCOUNTER — Encounter: Payer: Self-pay | Admitting: Internal Medicine

## 2015-05-19 ENCOUNTER — Other Ambulatory Visit (INDEPENDENT_AMBULATORY_CARE_PROVIDER_SITE_OTHER): Payer: BLUE CROSS/BLUE SHIELD

## 2015-05-19 ENCOUNTER — Ambulatory Visit (INDEPENDENT_AMBULATORY_CARE_PROVIDER_SITE_OTHER): Payer: BLUE CROSS/BLUE SHIELD | Admitting: Internal Medicine

## 2015-05-19 VITALS — BP 130/82 | HR 77 | Temp 98.9°F | Resp 16 | Ht 63.0 in | Wt 163.0 lb

## 2015-05-19 DIAGNOSIS — R10816 Epigastric abdominal tenderness: Secondary | ICD-10-CM

## 2015-05-19 DIAGNOSIS — K5909 Other constipation: Secondary | ICD-10-CM

## 2015-05-19 DIAGNOSIS — R634 Abnormal weight loss: Secondary | ICD-10-CM | POA: Diagnosis not present

## 2015-05-19 DIAGNOSIS — N3001 Acute cystitis with hematuria: Secondary | ICD-10-CM

## 2015-05-19 DIAGNOSIS — K5900 Constipation, unspecified: Secondary | ICD-10-CM | POA: Insufficient documentation

## 2015-05-19 DIAGNOSIS — K589 Irritable bowel syndrome without diarrhea: Secondary | ICD-10-CM | POA: Diagnosis not present

## 2015-05-19 DIAGNOSIS — Z23 Encounter for immunization: Secondary | ICD-10-CM | POA: Diagnosis not present

## 2015-05-19 LAB — COMPREHENSIVE METABOLIC PANEL
ALBUMIN: 4.7 g/dL (ref 3.5–5.2)
ALT: 37 U/L — ABNORMAL HIGH (ref 0–35)
AST: 30 U/L (ref 0–37)
Alkaline Phosphatase: 63 U/L (ref 39–117)
BILIRUBIN TOTAL: 0.9 mg/dL (ref 0.2–1.2)
BUN: 16 mg/dL (ref 6–23)
CALCIUM: 9.7 mg/dL (ref 8.4–10.5)
CHLORIDE: 105 meq/L (ref 96–112)
CO2: 26 meq/L (ref 19–32)
CREATININE: 0.63 mg/dL (ref 0.40–1.20)
GFR: 114.06 mL/min (ref >60.00)
Glucose, Bld: 94 mg/dL (ref 70–99)
Potassium: 4.4 mEq/L (ref 3.5–5.1)
Sodium: 138 mEq/L (ref 135–145)
Total Protein: 7.8 g/dL (ref 6.0–8.3)

## 2015-05-19 LAB — CBC WITH DIFFERENTIAL/PLATELET
BASOS ABS: 0 10*3/uL (ref 0.0–0.1)
BASOS PCT: 0.6 % (ref 0.0–3.0)
EOS ABS: 0.1 10*3/uL (ref 0.0–0.7)
Eosinophils Relative: 0.9 % (ref 0.0–5.0)
HEMATOCRIT: 39.2 % (ref 36.0–46.0)
HEMOGLOBIN: 13.3 g/dL (ref 12.0–15.0)
LYMPHS PCT: 30.4 % (ref 12.0–46.0)
Lymphs Abs: 2.1 10*3/uL (ref 0.7–4.0)
MCHC: 33.9 g/dL (ref 30.0–36.0)
MCV: 82.4 fl (ref 78.0–100.0)
MONOS PCT: 6.6 % (ref 3.0–12.0)
Monocytes Absolute: 0.5 10*3/uL (ref 0.1–1.0)
Neutro Abs: 4.3 10*3/uL (ref 1.4–7.7)
Neutrophils Relative %: 61.5 % (ref 43.0–77.0)
Platelets: 332 10*3/uL (ref 150.0–400.0)
RBC: 4.76 Mil/uL (ref 3.87–5.11)
RDW: 12.9 % (ref 11.5–15.5)
WBC: 7 10*3/uL (ref 4.0–10.5)

## 2015-05-19 LAB — URINALYSIS, ROUTINE W REFLEX MICROSCOPIC
BILIRUBIN URINE: NEGATIVE
NITRITE: NEGATIVE
Specific Gravity, Urine: >=1.03 — AB (ref 1.000–1.030)
URINE GLUCOSE: NEGATIVE
UROBILINOGEN UA: 0.2 (ref 0.0–1.0)
pH: 6 (ref 5.0–8.0)

## 2015-05-19 LAB — HCG, QUANTITATIVE, PREGNANCY: Quantitative HCG: 0.14 m[IU]/mL

## 2015-05-19 LAB — TSH: TSH: 1.06 u[IU]/mL (ref 0.35–4.50)

## 2015-05-19 MED ORDER — SULFAMETHOXAZOLE-TRIMETHOPRIM 800-160 MG PO TABS: 1.0000 | ORAL_TABLET | Freq: Two times a day (BID) | ORAL | Status: AC

## 2015-05-19 MED ORDER — LINACLOTIDE 290 MCG PO CAPS: 290.0000 ug | ORAL_CAPSULE | Freq: Every day | ORAL | Status: DC

## 2015-05-19 NOTE — Patient Instructions (Signed)

## 2015-05-19 NOTE — Progress Notes (Signed)
Subjective:  Patient ID: [name], female    DOB: Jun 09, 1980  Age: 35 y.o. MRN: [id number]  CC: Abdominal Pain  NEW TO ME  HPI Lively L Freidman presents for evaluation of chronic abdominal pain. She has been seeing a gastroenterologist at [facility] and tells me that she is scheduled soon for an upper endoscopy. She complains of several month history of diffuse abdominal pain with cramping and constipation. She has had about a 45 pound unexplained weight lost over the last year. She is concerned that she may have cancer in her abdomen or pelvis. She does admit to being under a lot of emotional stress. She has had some recent episodes of atypical chest pain and tells me that she has been seen by neurology and cardiology. She tells me that nothing abnormal has been revealed on her testing.  History Anslee has a past medical history of Chicken pox; Frequent headaches; Arrhythmia; H/O hematuria; Nocturnal headaches; Heart palpitations; Anxiety; GERD (gastroesophageal reflux disease); Genital warts; and H/O chlamydia infection.   She has past surgical history that includes No past surgeries.   Her family history includes Arthritis in her maternal grandmother; Diabetes in her maternal grandmother; Drug abuse in her father; Heart disease in her maternal grandfather and maternal grandmother; Hyperlipidemia in her maternal grandmother, mother, and sister; Hypertension in her maternal grandmother and mother. There is no history of Other or Migraines.She reports that she has never smoked. She has never used smokeless tobacco. She reports that she drinks alcohol. She reports that she does not use illicit drugs.  Outpatient Prescriptions Prior to Visit  Medication Sig Dispense Refill  . cyclobenzaprine (FLEXERIL) 10 MG tablet Take 0.5-1 tablets (5-10 mg total) by mouth 3 (three) times daily as needed for muscle spasms. 30 tablet 0  . minoxidil (ROGAINE) 2 % external solution Apply topically 2 (two) times  daily. Apply 1 mL in the morning and night. 60 mL 0  . pantoprazole (PROTONIX) 20 MG tablet Take 1 tablet (20 mg total) by mouth daily. 30 tablet 1  . diazepam (VALIUM) 5 MG tablet Take 1 tablet (5 mg total) by mouth daily as needed for anxiety. Use for throat or chest pain for possible esophageal spasm. 5 tablet 0  . famotidine (PEPCID) 20 MG tablet Take 1 tablet (20 mg total) by mouth 2 (two) times daily. 10 tablet 0  . gabapentin (NEURONTIN) 300 MG capsule Take 1 capsule (300 mg total) by mouth 3 (three) times daily. 90 capsule 11  . lansoprazole (PREVACID) 15 MG capsule Take 15 mg by mouth daily at 12 noon.     No facility-administered medications prior to visit.    ROS Review of Systems  Constitutional: Positive for unexpected weight change. Negative for fever, chills, diaphoresis, activity change, appetite change and fatigue.  HENT: Negative.  Negative for sore throat and trouble swallowing.   Eyes: Negative.   Respiratory: Negative.  Negative for cough, choking, chest tightness, shortness of breath and stridor.   Cardiovascular: Negative.  Negative for chest pain, palpitations and leg swelling.  Gastrointestinal: Positive for abdominal pain and constipation. Negative for nausea, vomiting, diarrhea, blood in stool, abdominal distention, anal bleeding and rectal pain.  Endocrine: Negative.   Genitourinary: Positive for dysuria and frequency.  Musculoskeletal: Negative.  Negative for myalgias, back pain, joint swelling and arthralgias.  Skin: Negative.  Negative for color change and rash.  Allergic/Immunologic: Negative.   Neurological: Negative.   Hematological: Negative.  Negative for adenopathy. Does not bruise/bleed  easily.  Psychiatric/Behavioral: Negative.  Negative for suicidal ideas, sleep disturbance, self-injury, dysphoric mood and decreased concentration. The patient is not nervous/anxious.     Objective:  BP 130/82 mmHg  Pulse 77  Temp(Src) 98.9 F (37.2 C) (Oral)   Resp 16  Ht 5\' 3"  (1.6 m)  Wt 163 lb (73.936 kg)  BMI 28.88 kg/m2  SpO2 95%  LMP 04/24/2015  Physical Exam  Constitutional: She is oriented to person, place, and time. She appears well-developed and well-nourished.  Non-toxic appearance. She does not have a sickly appearance. She does not appear ill. No distress.  HENT:  Mouth/Throat: Oropharynx is clear and moist. No oropharyngeal exudate.  Eyes: Conjunctivae are normal. Right eye exhibits no discharge. Left eye exhibits no discharge. No scleral icterus.  Neck: Normal range of motion. Neck supple. No JVD present. No tracheal deviation present. No thyromegaly present.  Cardiovascular: Normal rate, regular rhythm, normal heart sounds and intact distal pulses.  Exam reveals no gallop and no friction rub.   No murmur heard. Pulmonary/Chest: Effort normal and breath sounds normal. No stridor. No respiratory distress. She has no wheezes. She has no rales. She exhibits no tenderness.  Abdominal: Soft. Normal appearance. She exhibits no shifting dullness, no distension, no pulsatile liver, no fluid wave, no abdominal bruit, no ascites, no pulsatile midline mass and no mass. Bowel sounds are decreased. There is no hepatosplenomegaly, splenomegaly or hepatomegaly. There is tenderness in the epigastric area, periumbilical area and left lower quadrant. There is no rigidity, no rebound, no guarding, no CVA tenderness, no tenderness at McBurney's point and negative Murphy's sign. No hernia. Hernia confirmed negative in the ventral area, confirmed negative in the right inguinal area and confirmed negative in the left inguinal area.  Musculoskeletal: Normal range of motion. She exhibits no edema or tenderness.  Lymphadenopathy:    She has no cervical adenopathy.  Neurological: She is oriented to person, place, and time.  Skin: Skin is warm and dry. No rash noted. She is not diaphoretic. No erythema. No pallor.  Psychiatric: She has a normal mood and affect.  Her behavior is normal. Judgment and thought content normal.  Vitals reviewed.   Lab Results  Component Value Date   WBC 7.0 05/19/2015   HGB 13.3 05/19/2015   HCT 39.2 05/19/2015   PLT 332.0 05/19/2015   GLUCOSE 94 05/19/2015   CHOL 183 01/13/2015   TRIG 103 01/13/2015   HDL 45* 01/13/2015   LDLCALC 117 01/13/2015   ALT 37* 05/19/2015   AST 30 05/19/2015   NA 138 05/19/2015   K 4.4 05/19/2015   CL 105 05/19/2015   CREATININE 0.63 05/19/2015   BUN 16 05/19/2015   CO2 26 05/19/2015   TSH 1.06 05/19/2015   HGBA1C 4.7 03/08/2015    Assessment & Plan:   Garima was seen today for abdominal pain.  Diagnoses and all orders for this visit:  Need for Tdap vaccination -     Tdap vaccine greater than or equal to 7yo IM  Loss of weight- her labs don't show any secondary metabolic causes for weight loss, I will monitor her for depression and will screen her abdomen for masses. -     CT Abdomen Pelvis Wo Contrast; Future  Epigastric abdominal tenderness without rebound tenderness- her labs are unremarkable, her exam shows decreased bowel sounds, will treat the constipation with Linzess and will scan her abdomen to screen for peptic ulcer disease, mass, aneurysm. -     Comprehensive metabolic panel; Future -  CBC with Differential/Platelet; Future -     Urinalysis, Routine w reflex microscopic (not at [facility]); Future -     hCG, quantitative, pregnancy; Future -     CT Abdomen Pelvis Wo Contrast; Future  Other constipation- her labs show no secondary causes for constipation, will treat with Linzess. -     Linaclotide (LINZESS) 290 MCG CAPS capsule; Take 1 capsule (290 mcg total) by mouth daily. -     TSH; Future  IBS (irritable bowel syndrome)- her signs and symptoms are consistent with IBS with constipation, will treat with Linzess. -     Linaclotide (LINZESS) 290 MCG CAPS capsule; Take 1 capsule (290 mcg total) by mouth daily.  Acute cystitis with hematuria- her urinalysis is  abnormal, I will treat the infection with Bactrim DS for 5 days. -     sulfamethoxazole-trimethoprim (BACTRIM DS,SEPTRA DS) 800-160 MG tablet; Take 1 tablet by mouth 2 (two) times daily.   I have discontinued Ms. Eberly's gabapentin, famotidine, diazepam, and lansoprazole. I am also having her start on Linaclotide and sulfamethoxazole-trimethoprim. Additionally, I am having her maintain her minoxidil, cyclobenzaprine, and pantoprazole.  Meds ordered this encounter  Medications  . Linaclotide (LINZESS) 290 MCG CAPS capsule    Sig: Take 1 capsule (290 mcg total) by mouth daily.    Dispense:  90 capsule    Refill:  1  . sulfamethoxazole-trimethoprim (BACTRIM DS,SEPTRA DS) 800-160 MG tablet    Sig: Take 1 tablet by mouth 2 (two) times daily.    Dispense:  10 tablet    Refill:  1     Follow-up: Return in about 3 weeks (around 06/09/2015).  Scarlette Calico, MD

## 2015-05-21 ENCOUNTER — Encounter: Payer: Self-pay | Admitting: Internal Medicine

## 2015-05-24 ENCOUNTER — Telehealth: Payer: Self-pay | Admitting: Internal Medicine

## 2015-05-24 NOTE — Telephone Encounter (Signed)
[place] [facility] [facility] - Client [facility]  Patient Name: Cassandra [name]  DOB: 23-Oct-1980    Initial Comment caller states she was laying down - felt a lump in her neck - has pain   Nurse Assessment  Nurse: Justine Null, RN, Rodena Piety Date/Time (Eastern Time): 05/24/2015 2:54:39 PM  Confirm and document reason for call. If symptomatic, describe symptoms. You must click the next button to save text entered. ---caller states she was laying down - felt a lump in her neck - has pain caller stated that she has been having some discomfort in her neck and has felt a lump that is about dime sized and ahs no breathing difficulties and has no fevers and has been able to swallow fairly well and has been having some tightness in the lump when she swallows but has been able to swallow and has been having the swallowing difficulties for the past few weeks and has had a sore throat but that went away and has been a tingling in her teeth and jaw area and not there at present and has no swelling on the face and has redness of t he face and has hot cheeks at present  Has the patient traveled out of the country within the last 30 days? ---No  Does the patient have any new or worsening symptoms? ---Yes  Will a triage be completed? ---Yes  Related visit to physician within the last 2 weeks? ---No  Does the PT have any chronic conditions? (i.e. diabetes, asthma, etc.) ---No  Is the patient pregnant or possibly pregnant? (Ask all females between the ages of 2-55) ---No  Is this a behavioral health or substance abuse call? ---No     Guidelines    Guideline Title Affirmed Question Affirmed Notes  Skin Lump or Localized Swelling [1] Small swelling or lump AND [2] unexplained AND [3] present < 1 week (all triage qestions negative)    Final Disposition User   [place], RN, Rodena Piety    Disagree/Comply: Comply

## 2015-05-25 ENCOUNTER — Ambulatory Visit (INDEPENDENT_AMBULATORY_CARE_PROVIDER_SITE_OTHER): Payer: BLUE CROSS/BLUE SHIELD | Admitting: Neurology

## 2015-05-25 ENCOUNTER — Telehealth: Payer: Self-pay | Admitting: *Deleted

## 2015-05-25 ENCOUNTER — Ambulatory Visit: Payer: BLUE CROSS/BLUE SHIELD | Admitting: Cardiology

## 2015-05-25 DIAGNOSIS — G4489 Other headache syndrome: Secondary | ICD-10-CM

## 2015-05-25 NOTE — Telephone Encounter (Signed)
no showed f/u- first no show

## 2015-05-25 NOTE — Progress Notes (Signed)
No show

## 2015-05-26 ENCOUNTER — Telehealth: Payer: Self-pay | Admitting: Internal Medicine

## 2015-05-26 ENCOUNTER — Encounter: Payer: Self-pay | Admitting: Neurology

## 2015-05-26 NOTE — Telephone Encounter (Signed)
Pt states she has been taking only one pill a day and just realized she should've been taking 2 of Bactrum. She feels about 85% better buts still having some symptoms.  Should she get another round of antibiotics? Please advise pt [facility] on [address]

## 2015-05-26 NOTE — Telephone Encounter (Signed)
Could you please call pt regarding her CT scan

## 2015-05-27 NOTE — Telephone Encounter (Signed)
Pt had one refill left. States symptoms are coming back. She will get refill and call office bak with any further problem

## 2015-05-27 NOTE — Telephone Encounter (Signed)
Pt called to check up on this. Please help and cal pt back

## 2015-06-01 ENCOUNTER — Other Ambulatory Visit: Payer: BLUE CROSS/BLUE SHIELD

## 2015-06-01 ENCOUNTER — Ambulatory Visit (INDEPENDENT_AMBULATORY_CARE_PROVIDER_SITE_OTHER): Payer: BLUE CROSS/BLUE SHIELD | Admitting: Internal Medicine

## 2015-06-01 ENCOUNTER — Encounter: Payer: Self-pay | Admitting: Internal Medicine

## 2015-06-01 ENCOUNTER — Other Ambulatory Visit: Payer: Self-pay | Admitting: Internal Medicine

## 2015-06-01 VITALS — BP 112/70 | HR 95 | Temp 98.4°F | Resp 16 | Ht 63.0 in | Wt 161.0 lb

## 2015-06-01 DIAGNOSIS — K5909 Other constipation: Secondary | ICD-10-CM | POA: Diagnosis not present

## 2015-06-01 DIAGNOSIS — K589 Irritable bowel syndrome without diarrhea: Secondary | ICD-10-CM | POA: Diagnosis not present

## 2015-06-01 DIAGNOSIS — R300 Dysuria: Secondary | ICD-10-CM | POA: Diagnosis not present

## 2015-06-01 LAB — POCT URINALYSIS DIPSTICK
Blood, UA: 1.03
Glucose, UA: NEGATIVE
KETONES UA: NEGATIVE
Nitrite, UA: NEGATIVE
PH UA: 6
PROTEIN UA: 15
Urobilinogen, UA: 0.2

## 2015-06-01 MED ORDER — LINACLOTIDE 290 MCG PO CAPS: 290.0000 ug | ORAL_CAPSULE | Freq: Every day | ORAL | Status: DC

## 2015-06-01 NOTE — Progress Notes (Signed)
Subjective:  Patient ID: Cassandra [name], female    DOB: 1980-12-03  Age: 35 y.o. MRN: [id number]  CC: Urinary Tract Infection   HPI Cassandra [name] presents for follow-up. When I last saw her she was treated for abdominal pain and constipation. I recommended that she start taking Linzess. She has not done so. She tells me that the flu virus went thru her family and so shel didn't think it was a good time to start taking a new medication. She complains of persistent, diffuse, intermittent abdominal pain with nausea and constipation. She is also concerned about weight loss. She describes the abdominal discomfort as tenderness. She complains of intermittent nausea but no loss of appetite or vomiting.  She also needs a follow-up on a recent bladder infection, she denies dysuria, hematuria, fever, chills.  Outpatient Prescriptions Prior to Visit  Medication Sig Dispense Refill  . minoxidil (ROGAINE) 2 % external solution Apply topically 2 (two) times daily. Apply 1 mL in the morning and night. 60 mL 0  . pantoprazole (PROTONIX) 20 MG tablet Take 1 tablet (20 mg total) by mouth daily. 30 tablet 1  . cyclobenzaprine (FLEXERIL) 10 MG tablet Take 0.5-1 tablets (5-10 mg total) by mouth 3 (three) times daily as needed for muscle spasms. (Patient not taking: Reported on 06/01/2015) 30 tablet 0  . Linaclotide (LINZESS) 290 MCG CAPS capsule Take 1 capsule (290 mcg total) by mouth daily. (Patient not taking: Reported on 06/01/2015) 90 capsule 1   No facility-administered medications prior to visit.    ROS Review of Systems  Constitutional: Positive for unexpected weight change. Negative for fever, chills, diaphoresis, appetite change and fatigue.  HENT: Negative.  Negative for sinus pressure, sore throat and trouble swallowing.   Eyes: Negative.   Respiratory: Negative.  Negative for cough, choking, chest tightness, shortness of breath and stridor.   Cardiovascular: Negative.  Negative for chest pain,  palpitations and leg swelling.  Gastrointestinal: Positive for nausea, abdominal pain and constipation. Negative for vomiting, diarrhea, blood in stool and anal bleeding.  Endocrine: Negative.   Genitourinary: Negative.  Negative for dysuria, urgency, hematuria, flank pain, difficulty urinating and pelvic pain.  Musculoskeletal: Positive for back pain. Negative for myalgias and joint swelling.  Skin: Negative.   Neurological: Negative.  Negative for dizziness, tremors, weakness and numbness.  Hematological: Negative.  Negative for adenopathy. Does not bruise/bleed easily.  Psychiatric/Behavioral: Negative.     Objective:  BP 112/70 mmHg  Pulse 95  Temp(Src) 98.4 F (36.9 C) (Oral)  Resp 16  Ht 5\' 3"  (1.6 m)  Wt 161 lb (73.029 kg)  BMI 28.53 kg/m2  SpO2 94%  LMP 05/23/2015  BP Readings from Last 3 Encounters:  06/01/15 112/70  05/19/15 130/82  05/04/15 113/70    Wt Readings from Last 3 Encounters:  06/01/15 161 lb (73.029 kg)  05/19/15 163 lb (73.936 kg)  05/04/15 169 lb (76.658 kg)    Physical Exam  Constitutional: She is oriented to person, place, and time. She appears well-developed and well-nourished. No distress.  HENT:  Head: Normocephalic and atraumatic.  Mouth/Throat: Oropharynx is clear and moist. No oropharyngeal exudate.  Eyes: Conjunctivae are normal. Right eye exhibits no discharge. Left eye exhibits no discharge. No scleral icterus.  Neck: Normal range of motion. Neck supple. No JVD present. No tracheal deviation present. No thyromegaly present.  Cardiovascular: Normal rate, regular rhythm, normal heart sounds and intact distal pulses.  Exam reveals no gallop and no friction rub.   No  murmur heard. Pulmonary/Chest: Effort normal and breath sounds normal. No stridor. No respiratory distress. She has no wheezes. She has no rales. She exhibits no tenderness.  Abdominal: Soft. Normal appearance. She exhibits no distension and no mass. Bowel sounds are decreased.  There is no splenomegaly or hepatomegaly. There is tenderness in the right upper quadrant, suprapubic area and left upper quadrant. There is no rigidity, no rebound, no guarding, no CVA tenderness, no tenderness at McBurney's point and negative Murphy's sign. No hernia. Hernia confirmed negative in the ventral area, confirmed negative in the right inguinal area and confirmed negative in the left inguinal area.  Musculoskeletal: Normal range of motion. She exhibits no edema or tenderness.  Lymphadenopathy:    She has no cervical adenopathy.  Neurological: She is oriented to person, place, and time.  Skin: Skin is warm and dry. No rash noted. She is not diaphoretic. No erythema. No pallor.  Vitals reviewed.   Lab Results  Component Value Date   WBC 7.0 05/19/2015   HGB 13.3 05/19/2015   HCT 39.2 05/19/2015   PLT 332.0 05/19/2015   GLUCOSE 94 05/19/2015   CHOL 183 01/13/2015   TRIG 103 01/13/2015   HDL 45* 01/13/2015   LDLCALC 117 01/13/2015   ALT 37* 05/19/2015   AST 30 05/19/2015   NA 138 05/19/2015   K 4.4 05/19/2015   CL 105 05/19/2015   CREATININE 0.63 05/19/2015   BUN 16 05/19/2015   CO2 26 05/19/2015   TSH 1.06 05/19/2015   HGBA1C 4.7 03/08/2015    Dg Chest 2 View  05/04/2015  CLINICAL DATA:  35 year old female with burning chest and abdominal pain with shortness of breath for 3 days. Initial encounter. EXAM: CHEST  2 VIEW COMPARISON:  Cardiac CTA 04/07/2015.  Chest radiographs 01/06/2015. FINDINGS: Normal lung volumes. Normal cardiac size and mediastinal contours. Visualized tracheal air column is within normal limits. Lung parenchyma is stable and clear. No pneumothorax or pleural effusion. Mild scoliotic curvature in the spine. No acute osseous abnormality identified. IMPRESSION: Negative, no acute cardiopulmonary abnormality. Electronically Signed   By: Genevie Ann M.D.   On: 05/04/2015 09:45    Assessment & Plan:   Cassandra [name] was seen today for urinary tract  infection.  Diagnoses and all orders for this visit:  Dysuria- the bladder infection has resolved, she continues to have some blood in her urine but urine culture and testing for gonorrhea and Chlamydia is negative. -     POCT urinalysis dipstick -     Urine Culture; Future -     GC/chlamydia probe amp, urine; Future  Other constipation- I have asked her to start taking Linzess. -     Linaclotide (LINZESS) 290 MCG CAPS capsule; Take 1 capsule (290 mcg total) by mouth daily.  IBS (irritable bowel syndrome)- she will start taking Linzess. -     Linaclotide (LINZESS) 290 MCG CAPS capsule; Take 1 capsule (290 mcg total) by mouth daily.  I am having Ms. Sprenkle maintain her minoxidil, cyclobenzaprine, pantoprazole, and Linaclotide.  Meds ordered this encounter  Medications  . Linaclotide (LINZESS) 290 MCG CAPS capsule    Sig: Take 1 capsule (290 mcg total) by mouth daily.    Dispense:  90 capsule    Refill:  1     Follow-up: Return in about 6 weeks (around 07/13/2015).  Scarlette Calico, MD

## 2015-06-01 NOTE — Patient Instructions (Signed)

## 2015-06-01 NOTE — Progress Notes (Signed)
Pre visit review using our clinic review tool, if applicable. No additional management support is needed unless otherwise documented below in the visit note. 

## 2015-06-02 ENCOUNTER — Encounter: Payer: Self-pay | Admitting: Internal Medicine

## 2015-06-02 ENCOUNTER — Ambulatory Visit (INDEPENDENT_AMBULATORY_CARE_PROVIDER_SITE_OTHER)
Admission: RE | Admit: 2015-06-02 | Discharge: 2015-06-02 | Disposition: A | Discharge status: Home / Self Care | Payer: BLUE CROSS/BLUE SHIELD | Source: Ambulatory Visit | Attending: Internal Medicine | Admitting: Internal Medicine

## 2015-06-02 ENCOUNTER — Telehealth: Payer: Self-pay | Admitting: Internal Medicine

## 2015-06-02 DIAGNOSIS — R634 Abnormal weight loss: Secondary | ICD-10-CM | POA: Diagnosis not present

## 2015-06-02 DIAGNOSIS — R10816 Epigastric abdominal tenderness: Secondary | ICD-10-CM

## 2015-06-02 LAB — URINE CULTURE
COLONY COUNT: NO GROWTH
ORGANISM ID, BACTERIA: NO GROWTH

## 2015-06-02 LAB — GC/CHLAMYDIA PROBE AMP
CT Probe RNA: NOT DETECTED
GC PROBE AMP APTIMA: NOT DETECTED

## 2015-06-02 MED ORDER — IOPAMIDOL (ISOVUE-300) INJECTION 61%
100.0000 mL | Freq: Once | INTRAVENOUS | Status: AC | PRN
  Administered 2015-06-02: 100 mL via INTRAVENOUS

## 2015-06-02 NOTE — Telephone Encounter (Signed)
[facility]  Patient Name: Cassandra [name]  DOB: 1980/03/22    Initial Comment Caller states she was prescribed a new med and is having severe runs/diarrhea right now.    Nurse Assessment  Nurse: Christel Mormon, RN, Levada Dy Date/Time Eilene Ghazi Time): 06/02/2015 3:46:34 PM  Confirm and document reason for call. If symptomatic, describe symptoms. You must click the next button to save text entered. ---Caller started Linzess 2 wks ago and she stopped it for a while when she had the flu. When she first started it, it gave her diarrhea. She started it back again yesterday and it has given her severe diarrhea. She has had it 2-3x today "and it came out like water".  Has the patient traveled out of the country within the last 30 days? ---No  Does the patient have any new or worsening symptoms? ---Yes  Will a triage be completed? ---Yes  Related visit to physician within the last 2 weeks? ---Yes  Does the PT have any chronic conditions? (i.e. diabetes, asthma, etc.) ---Yes  List chronic conditions. ---IBS, GERD No longer taking the Rogaine or Flexaril,  Is the patient pregnant or possibly pregnant? (Ask all females between the ages of 71-55) ---No  Is this a behavioral health or substance abuse call? ---No     Guidelines    Guideline Title Affirmed Question Affirmed Notes  Diarrhea [1] Recent antibiotic therapy (i.e., within last 2 months) AND [2] > 3 days since antibiotic was stopped    Final Disposition User   See Physician within [facility], Therapist, sports, Levada Dy    Comments  Refused appt for today. She states wants to try Probiotics and stop the Linzess until the diarrhea stops. She just finished 2 rounds of antibiotics.   Referrals  REFERRED TO PCP OFFICE   Disagree/Comply: Disagree  Disagree/Comply Reason: Wait and see

## 2015-06-03 ENCOUNTER — Telehealth: Payer: Self-pay | Admitting: Internal Medicine

## 2015-06-03 NOTE — Telephone Encounter (Signed)
Advised patient that urinalysis shows no infection---also advised that some of her discomfort could be due to ct results (abdomen)---patient states she is still having discomfort and wanted to know what to do about that---i advised to try ibuprofen and if she did not feel better with ibuprofen, she could make appt with Saturday clinic to possibly get toradol injection and flomax prescribed----but, patient stated she works in [employer] on saturdays and cant make appt---i am routing to dr Ronnald Ramp, fyi----patient would like to know what he suggests at this point, patient is aware dr Ronnald Ramp not in office today---and could be as late as Monday before dr Ronnald Ramp gets message and returns call

## 2015-06-03 NOTE — Telephone Encounter (Signed)
Done 06/02/15

## 2015-06-03 NOTE — Telephone Encounter (Signed)
Is requesting call back with lab results °

## 2015-06-04 ENCOUNTER — Other Ambulatory Visit: Payer: Self-pay | Admitting: Internal Medicine

## 2015-06-04 ENCOUNTER — Encounter: Payer: Self-pay | Admitting: Internal Medicine

## 2015-06-04 DIAGNOSIS — N83201 Unspecified ovarian cyst, right side: Secondary | ICD-10-CM

## 2015-06-04 DIAGNOSIS — N83202 Unspecified ovarian cyst, left side: Secondary | ICD-10-CM

## 2015-06-04 DIAGNOSIS — K5909 Other constipation: Secondary | ICD-10-CM

## 2015-06-04 DIAGNOSIS — K589 Irritable bowel syndrome without diarrhea: Secondary | ICD-10-CM

## 2015-06-04 DIAGNOSIS — N200 Calculus of kidney: Secondary | ICD-10-CM

## 2015-06-04 MED ORDER — LINACLOTIDE 145 MCG PO CAPS: 145.0000 ug | ORAL_CAPSULE | Freq: Every day | ORAL | Status: DC

## 2015-06-06 NOTE — Telephone Encounter (Signed)
Mychart message sent to pt on 3/25

## 2015-06-08 ENCOUNTER — Ambulatory Visit: Payer: BLUE CROSS/BLUE SHIELD | Admitting: Internal Medicine

## 2015-06-08 NOTE — Telephone Encounter (Signed)
Referrals have been sent I think this has been taken care of

## 2015-06-17 ENCOUNTER — Encounter: Payer: Self-pay | Admitting: Internal Medicine

## 2015-06-21 ENCOUNTER — Ambulatory Visit: Payer: BLUE CROSS/BLUE SHIELD | Admitting: Gastroenterology

## 2015-06-21 ENCOUNTER — Ambulatory Visit: Payer: BLUE CROSS/BLUE SHIELD | Admitting: Obstetrics and Gynecology

## 2015-06-25 ENCOUNTER — Encounter: Payer: Self-pay | Admitting: Internal Medicine

## 2015-06-29 ENCOUNTER — Ambulatory Visit (INDEPENDENT_AMBULATORY_CARE_PROVIDER_SITE_OTHER): Payer: BLUE CROSS/BLUE SHIELD | Admitting: Obstetrics and Gynecology

## 2015-06-29 ENCOUNTER — Encounter: Payer: Self-pay | Admitting: Obstetrics and Gynecology

## 2015-06-29 VITALS — BP 110/70 | HR 64 | Resp 14 | Wt 164.0 lb

## 2015-06-29 DIAGNOSIS — N719 Inflammatory disease of uterus, unspecified: Secondary | ICD-10-CM

## 2015-06-29 DIAGNOSIS — N941 Unspecified dyspareunia: Secondary | ICD-10-CM

## 2015-06-29 MED ORDER — DOXYCYCLINE HYCLATE 100 MG PO CAPS: 100.0000 mg | ORAL_CAPSULE | Freq: Two times a day (BID) | ORAL | Status: DC

## 2015-06-29 NOTE — Progress Notes (Signed)
Patient ID: Cassandra [name], female   DOB: 10-07-1980, 35 y.o.   MRN: [id number] GYNECOLOGY  VISIT   HPI: 35 y.o.   Married  [place] American  female   [phone] with Patient's last menstrual period was 06/18/2015.   here for follow up of ovarian cyst and abdominal pain. She was sent by her primary for abdominal pain and "ovarian cysts", noted to have ovarian follicles on her CT. Her pain improved some after treatment of a UTI. She was having pain in her entire abdomen and back. Currently not having lower abdominal pain.  Menses q month x 4-5 days. Saturating a super tampon in 2-3 hours. Mild cramps. Pain with intercourse for years, deep inside, occurs about 80% of the time. Mostly positional.  She has a BM 3 x a week. No urinary c/o.   GYNECOLOGIC HISTORY: Patient's last menstrual period was 06/18/2015. Contraception:condoms Menopausal hormone therapy: none         OB History    Gravida Para Term Preterm AB TAB SAB Ectopic Multiple Living   7 3 3  4  3   3          Patient Active Problem List   Diagnosis Date Noted  . Epigastric abdominal tenderness without rebound tenderness 05/19/2015  . Constipation 05/19/2015  . Acute cystitis with hematuria 05/19/2015  . IBS (irritable bowel syndrome) 05/19/2015  . Loss of weight 09/21/2014  . Hypertriglyceridemia 05/15/2013    Past Medical History  Diagnosis Date  . Chicken pox   . Frequent headaches   . Arrhythmia   . H/O hematuria   . Nocturnal headaches   . Heart palpitations   . Anxiety   . GERD (gastroesophageal reflux disease)   . Genital warts   . H/O chlamydia infection     Past Surgical History  Procedure Laterality Date  . No past surgeries      Current Outpatient Prescriptions  Medication Sig Dispense Refill  . cyclobenzaprine (FLEXERIL) 10 MG tablet Take 0.5-1 tablets (5-10 mg total) by mouth 3 (three) times daily as needed for muscle spasms. 30 tablet 0  . Linaclotide (LINZESS) 145 MCG CAPS capsule Take 1 capsule (145  mcg total) by mouth daily. 90 capsule 1  . pantoprazole (PROTONIX) 20 MG tablet Take 1 tablet (20 mg total) by mouth daily. 30 tablet 1  . doxycycline (VIBRAMYCIN) 100 MG capsule Take 1 capsule (100 mg total) by mouth 2 (two) times daily. Take BID for 14 days.  Take with food as can cause GI distress. 28 capsule 0   No current facility-administered medications for this visit.     ALLERGIES: Review of patient's allergies indicates no known allergies.  Family History  Problem Relation Age of Onset  . Hyperlipidemia Mother   . Hypertension Mother   . Drug abuse Father   . Arthritis Maternal Grandmother   . Hyperlipidemia Maternal Grandmother   . Heart disease Maternal Grandmother   . Hypertension Maternal Grandmother   . Diabetes Maternal Grandmother   . Heart disease Maternal Grandfather   . Other Neg Hx     pco  . Migraines Neg Hx   . Hyperlipidemia Sister     Social History   Social History  . Marital Status: Married    Spouse Name: Jamel  . Number of Children: 3  . Years of Education: 11   Occupational History  . Not on file.   Social History Main Topics  . Smoking status: Never Smoker   . Smokeless  tobacco: Never Used  . Alcohol Use: 0.0 oz/week    0 Standard drinks or equivalent per week     Comment: occasional  . Drug Use: No     Comment: former (quit 2007)  . Sexual Activity:    Partners: Male    Birth Control/ Protection: Condom   Other Topics Concern  . Not on file   Social History Narrative   Epworth Sleepiness Scale = 2 (01/13/2015)Lives with husband and 3 children   Caffeine use: 1-2 coffee per week.     Review of Systems  Constitutional: Negative.   HENT: Negative.   Eyes: Negative.   Respiratory: Negative.   Cardiovascular: Negative.   Gastrointestinal: Negative.   Genitourinary: Negative.   Musculoskeletal: Negative.   Skin: Negative.   Neurological: Negative.   Endo/Heme/Allergies: Negative.   Psychiatric/Behavioral: Negative.      PHYSICAL EXAMINATION:    BP 110/70 mmHg  Pulse 64  Resp 14  Wt 164 lb (74.39 kg)  LMP 06/18/2015    General appearance: alert, cooperative and appears stated age Abdomen: soft, mild diffuse tenderness; minimally distended; no masses,  no organomegaly  Pelvic: External genitalia:  no lesions              Urethra:  normal appearing urethra with no masses, tenderness or lesions              Cervix: no cervical motion tenderness              Bimanual Exam:  Uterus:  retroverted, tender, mobile, normal sized.               Adnexa: no mass, fullness, tenderness   Pelvic floor: not tender               Chaperone was present for exam.  ASSESSMENT Dyspareunia, tender uterus. Will treat for a presumed endometritis Prior abdominal pain has improved Ovarian follicles are normal. I personally looked at the CT and measured the cysts, the largest was 2 cm. This is c/w a functional cyst.     PLAN Treat with doxycycline x 2 weeks If not improved will f/u and likely set up an ultrasound   An After Visit Summary was printed and given to the patient.  CC: Dr Scarlette Calico

## 2015-07-05 ENCOUNTER — Ambulatory Visit: Payer: BLUE CROSS/BLUE SHIELD | Admitting: Internal Medicine

## 2015-08-24 ENCOUNTER — Encounter: Payer: Self-pay | Admitting: Obstetrics and Gynecology

## 2015-10-06 ENCOUNTER — Telehealth: Payer: Self-pay | Admitting: Obstetrics and Gynecology

## 2015-10-06 NOTE — Telephone Encounter (Signed)
Left message with patient to return call. Calling to get updated insurance information to refile claim.

## 2015-12-06 ENCOUNTER — Telehealth: Payer: Self-pay | Admitting: Internal Medicine

## 2015-12-06 NOTE — Telephone Encounter (Signed)
PLEASE NOTE: All timestamps contained within this report are represented as [place] Standard Time. CONFIDENTIALTY NOTICE: This fax transmission is intended only for the addressee. It contains information that is legally privileged, confidential or otherwise protected from use or disclosure. If you are not the intended recipient, you are strictly prohibited from reviewing, disclosing, copying using or disseminating any of this information or taking any action in reliance on or regarding this information. If you have received this fax in error, please notify us immediately by telephone so that we can arrange for its return to [place]. Phone: [phone], Toll-Free: [phone], Fax: [phone] Page: 1 of 1 Call Id: [id number] [place] Patient Name: Cassandra [name] DOB: 1980/08/12 Initial Comment Caller states she is having flutters in her heart with chest pain. Nurse Assessment Nurse: Dimas Chyle, RN, Dellis Filbert Date/Time Eilene Ghazi Time): 12/06/2015 1:37:03 PM Confirm and document reason for call. If symptomatic, describe symptoms. You must click the next button to save text entered. ---Caller states she is having flutters in her heart with chest pain. Symptoms started back 3 days ago. Has the patient traveled out of the country within the last 30 days? ---No Does the patient have any new or worsening symptoms? ---Yes Will a triage be completed? ---Yes Related visit to physician within the last 2 weeks? ---No Does the PT have any chronic conditions? (i.e. diabetes, asthma, etc.) ---No Is the patient pregnant or possibly pregnant? (Ask all females between the ages of 92-55) ---No Is this a behavioral health or substance abuse call? ---No Guidelines Guideline Title Affirmed Question Affirmed Notes Heart Rate and Heartbeat Questions [1] Skipped or extra beat(s) AND [2] occurs 4 or more times per minute Final  Disposition User See Physician within 4 Hours (or PCP triage) Dimas Chyle, RN, [place] Spoke with office on backline about caller unable to keep 4 hour outcome for appointment and advised that she wanted to be seen by her PCP and appointment was scheduled with him for tomorrow. Also advised that caller was advised if symptoms got worse that they would need to go to [facility] or ED. Referrals REFERRED TO PCP OFFICE Disagree/Comply: Comply

## 2015-12-07 ENCOUNTER — Ambulatory Visit (INDEPENDENT_AMBULATORY_CARE_PROVIDER_SITE_OTHER)
Admission: RE | Admit: 2015-12-07 | Discharge: 2015-12-07 | Disposition: A | Discharge status: Home / Self Care | Payer: Self-pay | Source: Ambulatory Visit | Attending: Internal Medicine | Admitting: Internal Medicine

## 2015-12-07 ENCOUNTER — Ambulatory Visit (INDEPENDENT_AMBULATORY_CARE_PROVIDER_SITE_OTHER): Payer: Self-pay | Admitting: Internal Medicine

## 2015-12-07 ENCOUNTER — Encounter: Payer: Self-pay | Admitting: Internal Medicine

## 2015-12-07 ENCOUNTER — Other Ambulatory Visit (INDEPENDENT_AMBULATORY_CARE_PROVIDER_SITE_OTHER): Payer: Self-pay

## 2015-12-07 VITALS — BP 110/70 | HR 74 | Temp 98.4°F | Resp 16 | Ht 63.0 in | Wt 174.2 lb

## 2015-12-07 DIAGNOSIS — R079 Chest pain, unspecified: Secondary | ICD-10-CM

## 2015-12-07 DIAGNOSIS — M940 Chondrocostal junction syndrome [Tietze]: Secondary | ICD-10-CM | POA: Insufficient documentation

## 2015-12-07 DIAGNOSIS — R002 Palpitations: Secondary | ICD-10-CM

## 2015-12-07 LAB — CBC WITH DIFFERENTIAL/PLATELET
BASOS ABS: 0 10*3/uL (ref 0.0–0.1)
Basophils Relative: 0.4 % (ref 0.0–3.0)
EOS ABS: 0.1 10*3/uL (ref 0.0–0.7)
Eosinophils Relative: 1.3 % (ref 0.0–5.0)
HEMATOCRIT: 37.6 % (ref 36.0–46.0)
Hemoglobin: 12.6 g/dL (ref 12.0–15.0)
LYMPHS ABS: 2.2 10*3/uL (ref 0.7–4.0)
LYMPHS PCT: 26.3 % (ref 12.0–46.0)
MCHC: 33.6 g/dL (ref 30.0–36.0)
MCV: 82.3 fl (ref 78.0–100.0)
MONO ABS: 0.6 10*3/uL (ref 0.1–1.0)
Monocytes Relative: 6.9 % (ref 3.0–12.0)
NEUTROS ABS: 5.4 10*3/uL (ref 1.4–7.7)
NEUTROS PCT: 65.1 % (ref 43.0–77.0)
PLATELETS: 337 10*3/uL (ref 150.0–400.0)
RBC: 4.56 Mil/uL (ref 3.87–5.11)
RDW: 13.9 % (ref 11.5–15.5)
WBC: 8.2 10*3/uL (ref 4.0–10.5)

## 2015-12-07 LAB — COMPREHENSIVE METABOLIC PANEL
ALT: 14 U/L (ref 0–35)
AST: 13 U/L (ref 0–37)
Albumin: 4.4 g/dL (ref 3.5–5.2)
Alkaline Phosphatase: 68 U/L (ref 39–117)
BILIRUBIN TOTAL: 0.5 mg/dL (ref 0.2–1.2)
BUN: 14 mg/dL (ref 6–23)
CHLORIDE: 103 meq/L (ref 96–112)
CO2: 30 meq/L (ref 19–32)
CREATININE: 0.63 mg/dL (ref 0.40–1.20)
Calcium: 9.4 mg/dL (ref 8.4–10.5)
GFR: 113.71 mL/min (ref >60.00)
GLUCOSE: 83 mg/dL (ref 70–99)
Potassium: 4.1 mEq/L (ref 3.5–5.1)
SODIUM: 138 meq/L (ref 135–145)
Total Protein: 7.6 g/dL (ref 6.0–8.3)

## 2015-12-07 MED ORDER — NORTRIPTYLINE HCL 10 MG PO CAPS: 10.0000 mg | ORAL_CAPSULE | Freq: Every day | ORAL | 1 refills | Status: DC

## 2015-12-07 NOTE — Progress Notes (Signed)
Subjective:  Patient ID: Cassandra [name], female    DOB: Nov 12, 1980  Age: 35 y.o. MRN: [id number]  CC: Chest Pain and Palpitations   HPI MARIENNE HANISH presents for concerns about chest pain and palpitations. She has had intermittent episodes of this over the last year but says for the last 3 or 4 days her symptoms have worsened. She complains of an intermittent pain over her lower breastbone that she describes as briefly sharp and stabbing and aching. There is some worsening with palpitation, movement, and deep breath. She also says her palpitations have been much more prominent. She describes an intermittent fluttering sensation that radiates up into her anterior throat. The palpitations are most prominent when she is laying in the bed. She denies dizziness, lightheadedness, near syncope, syncope, edema, hemoptysis, cough, or abdominal pain. She had a normal exercise treadmill test about 9 months ago. Her prior EKGs have all been normal.  Outpatient Medications Prior to Visit  Medication Sig Dispense Refill  . Linaclotide (LINZESS) 145 MCG CAPS capsule Take 1 capsule (145 mcg total) by mouth daily. 90 capsule 1  . pantoprazole (PROTONIX) 20 MG tablet Take 1 tablet (20 mg total) by mouth daily. 30 tablet 1  . cyclobenzaprine (FLEXERIL) 10 MG tablet Take 0.5-1 tablets (5-10 mg total) by mouth 3 (three) times daily as needed for muscle spasms. (Patient not taking: Reported on 12/07/2015) 30 tablet 0  . doxycycline (VIBRAMYCIN) 100 MG capsule Take 1 capsule (100 mg total) by mouth 2 (two) times daily. Take BID for 14 days.  Take with food as can cause GI distress. 28 capsule 0   No facility-administered medications prior to visit.     ROS Review of Systems  Constitutional: Negative for activity change, appetite change, chills, diaphoresis, fatigue, fever and unexpected weight change.  HENT: Negative.  Negative for sore throat, trouble swallowing and voice change.   Eyes: Negative.  Negative for  visual disturbance.  Respiratory: Negative for cough, choking, chest tightness, shortness of breath and stridor.   Cardiovascular: Positive for chest pain and palpitations. Negative for leg swelling.  Gastrointestinal: Negative.  Negative for abdominal pain, blood in stool, constipation, diarrhea, nausea and vomiting.  Endocrine: Negative.   Genitourinary: Negative.   Musculoskeletal: Negative.  Negative for back pain, myalgias and neck pain.  Skin: Negative.  Negative for color change.  Allergic/Immunologic: Negative.   Neurological: Negative.   Hematological: Negative.  Negative for adenopathy. Does not bruise/bleed easily.  Psychiatric/Behavioral: Positive for sleep disturbance. Negative for behavioral problems, confusion, decreased concentration, dysphoric mood and suicidal ideas. The patient is nervous/anxious.     Objective:  BP 110/70 (BP Location: Left Arm, Patient Position: Sitting, Cuff Size: Normal)   Pulse 74   Temp 98.4 F (36.9 C) (Oral)   Resp 16   Ht 5\' 3"  (1.6 m)   Wt 174 lb 4 oz (79 kg)   LMP 11/28/2015   SpO2 98%   BMI 30.87 kg/m   BP Readings from Last 3 Encounters:  12/07/15 110/70  06/29/15 110/70  06/01/15 112/70    Wt Readings from Last 3 Encounters:  12/07/15 174 lb 4 oz (79 kg)  06/29/15 164 lb (74.4 kg)  06/01/15 161 lb (73 kg)    Physical Exam  Constitutional: She is oriented to person, place, and time. No distress.  HENT:  Mouth/Throat: Oropharynx is clear and moist. No oropharyngeal exudate.  Eyes: Conjunctivae are normal. Right eye exhibits no discharge. Left eye exhibits no discharge. No scleral  icterus.  Neck: Normal range of motion. Neck supple. No JVD present. No tracheal deviation present. No thyromegaly present.  Cardiovascular: Normal rate, regular rhythm, normal heart sounds and intact distal pulses.  Exam reveals no gallop and no friction rub.   No murmur heard. EKG ----  Sinus  Rhythm  WITHIN NORMAL LIMITS  Pulmonary/Chest:  Effort normal and breath sounds normal. No stridor. No respiratory distress. She has no wheezes. She has no rales. She exhibits tenderness and bony tenderness. She exhibits no mass, no crepitus, no edema, no deformity and no swelling.    Abdominal: Soft. Bowel sounds are normal. She exhibits no distension and no mass. There is no tenderness. There is no rebound and no guarding.  Musculoskeletal: Normal range of motion. She exhibits no edema, tenderness or deformity.  Lymphadenopathy:    She has no cervical adenopathy.  Neurological: She is oriented to person, place, and time.  Skin: Skin is warm and dry. No rash noted. She is not diaphoretic. No erythema. No pallor.  Psychiatric: She has a normal mood and affect. Her behavior is normal. Judgment and thought content normal.  Vitals reviewed.   Lab Results  Component Value Date   WBC 8.2 12/07/2015   HGB 12.6 12/07/2015   HCT 37.6 12/07/2015   PLT 337.0 12/07/2015   GLUCOSE 83 12/07/2015   CHOL 183 01/13/2015   TRIG 103 01/13/2015   HDL 45 (L) 01/13/2015   LDLCALC 117 01/13/2015   ALT 14 12/07/2015   AST 13 12/07/2015   NA 138 12/07/2015   K 4.1 12/07/2015   CL 103 12/07/2015   CREATININE 0.63 12/07/2015   BUN 14 12/07/2015   CO2 30 12/07/2015   TSH 1.53 12/07/2015   HGBA1C 4.7 03/08/2015    Ct Abdomen Pelvis W Contrast  Result Date: 06/02/2015 CLINICAL DATA:  Decreased appetite over the last year and a half, 30 pound weight loss, some epigastric pain and nausea over the last 2 months EXAM: CT ABDOMEN AND PELVIS WITH CONTRAST TECHNIQUE: Multidetector CT imaging of the abdomen and pelvis was performed using the standard protocol following bolus administration of intravenous contrast. CONTRAST:  159mL ISOVUE-300 IOPAMIDOL (ISOVUE-300) INJECTION 61% COMPARISON:  None. FINDINGS: The lung bases are clear. The liver enhances with no focal abnormality and no ductal dilatation is seen. No calcified gallstones are noted. The pancreas is  normal in size and the pancreatic duct is not dilated. The adrenal glands and spleen are unremarkable. The stomach is moderately distended with oral contrast and retained food debris. The kidneys enhance, and there is a single 5 mm nonobstructing right lower pole renal calculus present. No left renal calculi are seen. The abdominal aorta is normal in caliber. No adenopathy is seen. The uterus is unremarkable. Ovarian follicles are noted bilaterally. No significant free fluid is seen. There is however fluid within the colon which may be due to recent enema or diarrhea. The terminal ileum opacifies with oral contrast and is unremarkable, as is the appendix. The lumbar vertebrae are in normal alignment with normal intervertebral disc spaces. IMPRESSION: 1. Single nonobstructing 5 mm right lower pole renal calculus. 2. Probable bilateral ovarian follicles.  No free fluid. 3. Fluid within the rectosigmoid colon may be due to recent enema or diarrhea. Correlate clinically. 4. The appendix and terminal ileum are unremarkable. Electronically Signed   By: Ivar Drape M.D.   On: 06/02/2015 16:54    Assessment & Plan:   Yeshia was seen today for chest pain and palpitations.  Diagnoses and all orders for this visit:  Heart palpitations- Her EKG is normal today, her labs are negative for any secondary or metabolic causes for palpitations. I am concerned about her anxiety but for now will evaluate for other causes of palpitations such as SVT, sinus tachycardia, A. fib/A flutter, tachybradycardia syndrome-to screen for this I have asked her to do a 2 week event monitor. -     EKG 12-Lead -     Thyroid Panel With TSH; Future -     CBC with Differential/Platelet; Future -     Comprehensive metabolic panel; Future -     Cardiac event monitor; Future  Chest pain, unspecified chest pain type- examination is positive for musculoskeletal causes, I've asked her to start using over-the-counter ibuprofen for symptom relief,  her EKG is normal-wrist x-ray is normal-d-dimer is negative for any concerns for pulmonary embolus. -     EKG 12-Lead -     DG Chest 2 View; Future -     D-dimer, quantitative (not at [facility]); Future  Costochondritis- she will start using over-the-counter ibuprofen for symptom relief and her symptoms sound significant so I have asked her to start taking a low-dose of nortriptyline at bedtime as this has been shown to reduce the pain of costochondritis. -     nortriptyline (PAMELOR) 10 MG capsule; Take 1 capsule (10 mg total) by mouth at bedtime.   I have discontinued Ms. Helbert's cyclobenzaprine and doxycycline. I am also having her start on nortriptyline. Additionally, I am having her maintain her pantoprazole and linaclotide.  Meds ordered this encounter  Medications  . nortriptyline (PAMELOR) 10 MG capsule    Sig: Take 1 capsule (10 mg total) by mouth at bedtime.    Dispense:  90 capsule    Refill:  1     Follow-up: Return in about 4 weeks (around 01/04/2016).  Scarlette Calico, MD

## 2015-12-07 NOTE — Patient Instructions (Signed)

## 2015-12-07 NOTE — Progress Notes (Signed)
Pre visit review using our clinic review tool, if applicable. No additional management support is needed unless otherwise documented below in the visit note. 

## 2015-12-08 ENCOUNTER — Encounter: Payer: Self-pay | Admitting: Internal Medicine

## 2015-12-08 LAB — THYROID PANEL WITH TSH
FREE THYROXINE INDEX: 2.2 (ref 1.4–3.8)
T3 UPTAKE: 27 % (ref 22–35)
T4 TOTAL: 8.3 ug/dL (ref 4.5–12.0)
TSH: 1.53 mIU/L

## 2015-12-08 LAB — D-DIMER, QUANTITATIVE: D-Dimer, Quant: 0.28 mcg/mL FEU (ref <0.50)

## 2015-12-12 ENCOUNTER — Ambulatory Visit: Payer: BLUE CROSS/BLUE SHIELD | Admitting: Internal Medicine

## 2015-12-26 ENCOUNTER — Encounter: Payer: Self-pay | Admitting: Obstetrics and Gynecology

## 2016-02-14 ENCOUNTER — Ambulatory Visit (INDEPENDENT_AMBULATORY_CARE_PROVIDER_SITE_OTHER): Payer: Self-pay | Admitting: Nurse Practitioner

## 2016-02-14 ENCOUNTER — Encounter: Payer: Self-pay | Admitting: Nurse Practitioner

## 2016-02-14 VITALS — BP 110/70 | HR 78 | Temp 98.7°F | Wt 178.2 lb

## 2016-02-14 DIAGNOSIS — R300 Dysuria: Secondary | ICD-10-CM

## 2016-02-14 DIAGNOSIS — N3000 Acute cystitis without hematuria: Secondary | ICD-10-CM

## 2016-02-14 LAB — POCT URINALYSIS DIPSTICK
Bilirubin, UA: NEGATIVE
Glucose, UA: NEGATIVE
NITRITE UA: NEGATIVE
PH UA: 5
Spec Grav, UA: 1.025
UROBILINOGEN UA: 0.2

## 2016-02-14 MED ORDER — NITROFURANTOIN MONOHYD MACRO 100 MG PO CAPS: 100.0000 mg | ORAL_CAPSULE | Freq: Two times a day (BID) | ORAL | 0 refills | Status: DC

## 2016-02-14 NOTE — Patient Instructions (Addendum)
Dysuria Introduction Dysuria is pain or discomfort while urinating. The pain or discomfort may be felt in the tube that carries urine out of the bladder (urethra) or in the surrounding tissue of the genitals. The pain may also be felt in the groin area, lower abdomen, and lower back. You may have to urinate frequently or have the sudden feeling that you have to urinate (urgency). Dysuria can affect both men and women, but is more common in women. Dysuria can be caused by many different things, including:  Urinary tract infection in women.  Infection of the kidney or bladder.  Kidney stones or bladder stones.  Certain sexually transmitted infections (STIs), such as chlamydia.  Dehydration.  Inflammation of the vagina.  Use of certain medicines.  Use of certain soaps or scented products that cause irritation. Follow these instructions at home: Watch your dysuria for any changes. The following actions may help to reduce any discomfort you are feeling:  Drink enough fluid to keep your urine clear or pale yellow.  Empty your bladder often. Avoid holding urine for long periods of time.  After a bowel movement or urination, women should cleanse from front to back, using each tissue only once.  Empty your bladder after sexual intercourse.  Take medicines only as directed by your health care provider.  If you were prescribed an antibiotic medicine, finish it all even if you start to feel better.  Avoid caffeine, tea, and alcohol. They can irritate the bladder and make dysuria worse. In men, alcohol may irritate the prostate.  Keep all follow-up visits as directed by your health care provider. This is important.  If you had any tests done to find the cause of dysuria, it is your responsibility to obtain your test results. Ask the lab or department performing the test when and how you will get your results. Talk with your health care provider if you have any questions about your  results. Contact a health care provider if:  You develop pain in your back or sides.  You have a fever.  You have nausea or vomiting.  You have blood in your urine.  You are not urinating as often as you usually do. Get help right away if:  You pain is severe and not relieved with medicines.  You are unable to hold down any fluids.  You or someone else notices a change in your mental function.  You have a rapid heartbeat at rest.  You have shaking or chills.  You feel extremely weak. This information is not intended to replace advice given to you by your health care provider. Make sure you discuss any questions you have with your health care provider. Document Released: 11/25/2003 Document Revised: 08/04/2015 Document Reviewed: 10/22/2013  2017 Elsevier  Urinary Tract Infection, Adult A urinary tract infection (UTI) is an infection of any part of the urinary tract, which includes the kidneys, ureters, bladder, and urethra. These organs make, store, and get rid of urine in the body. UTI can be a bladder infection (cystitis) or kidney infection (pyelonephritis). What are the causes? This infection may be caused by fungi, viruses, or bacteria. Bacteria are the most common cause of UTIs. This condition can also be caused by repeated incomplete emptying of the bladder during urination. What increases the risk? This condition is more likely to develop if:  You ignore your need to urinate or hold urine for long periods of time.  You do not empty your bladder completely during urination.  You  wipe back to front after urinating or having a bowel movement, if you are female.  You are uncircumcised, if you are female.  You are constipated.  You have a urinary catheter that stays in place (indwelling).  You have a weak defense (immune) system.  You have a medical condition that affects your bowels, kidneys, or bladder.  You have diabetes.  You take antibiotic medicines  frequently or for long periods of time, and the antibiotics no longer work well against certain types of infections (antibiotic resistance).  You take medicines that irritate your urinary tract.  You are exposed to chemicals that irritate your urinary tract.  You are female. What are the signs or symptoms? Symptoms of this condition include:  Fever.  Frequent urination or passing small amounts of urine frequently.  Needing to urinate urgently.  Pain or burning with urination.  Urine that smells bad or unusual.  Cloudy urine.  Pain in the lower abdomen or back.  Trouble urinating.  Blood in the urine.  Vomiting or being less hungry than normal.  Diarrhea or abdominal pain.  Vaginal discharge, if you are female. How is this diagnosed? This condition is diagnosed with a medical history and physical exam. You will also need to provide a urine sample to test your urine. Other tests may be done, including:  Blood tests.  Sexually transmitted disease (STD) testing. If you have had more than one UTI, a cystoscopy or imaging studies may be done to determine the cause of the infections. How is this treated? Treatment for this condition often includes a combination of two or more of the following:  Antibiotic medicine.  Other medicines to treat less common causes of UTI.  Over-the-counter medicines to treat pain.  Drinking enough water to stay hydrated. Follow these instructions at home:  Take over-the-counter and prescription medicines only as told by your health care provider.  If you were prescribed an antibiotic, take it as told by your health care provider. Do not stop taking the antibiotic even if you start to feel better.  Avoid alcohol, caffeine, tea, and carbonated beverages. They can irritate your bladder.  Drink enough fluid to keep your urine clear or pale yellow.  Keep all follow-up visits as told by your health care provider. This is important.  Make  sure to:  Empty your bladder often and completely. Do not hold urine for long periods of time.  Empty your bladder before and after sex.  Wipe from front to back after a bowel movement if you are female. Use each tissue one time when you wipe. Contact a health care provider if:  You have back pain.  You have a fever.  You feel nauseous or vomit.  Your symptoms do not get better after 3 days.  Your symptoms go away and then return. Get help right away if:  You have severe back pain or lower abdominal pain.  You are vomiting and cannot keep down any medicines or water. This information is not intended to replace advice given to you by your health care provider. Make sure you discuss any questions you have with your health care provider. Document Released: 12/06/2004 Document Revised: 08/10/2015 Document Reviewed: 01/17/2015 Elsevier Interactive Patient Education  2017 [employer].

## 2016-02-14 NOTE — Progress Notes (Signed)
   Subjective:    Patient ID: Cassandra [name], female    DOB: 11/27/1980, 35 y.o.   MRN: [id number]  The patient is a 35 y.o. Female that presents for c/o not feeling well x 3 days.  The patient states she has had diffuse abd pain.  The patient describes the pain as "sharp" that waxes and wanes.  Patient states yesterday she was "cramping", rates pain 4/10.  Also c/o nausea and left sided flank pain and lower back that radiates to the front.  Patient also c/o of bloating.  The patient states she has had a UTI before, but says its been awhile since her last.  The patient denies fever, chills, urinary frequency, urinary urgency, hematuria.  The patient states she took a tub bath and has had this feeling since.        Review of Systems  Constitutional: Negative for activity change, appetite change, chills, fatigue and fever.  Gastrointestinal: Positive for abdominal distention, abdominal pain and nausea.  Genitourinary: Positive for flank pain (left-sided) and pelvic pain. Negative for dysuria and hematuria.  Musculoskeletal: Arthralgias: left arm.  Neurological: Positive for dizziness. Headaches: frontal x 2 days ago.       Objective:   Physical Exam  Constitutional: She is oriented to person, place, and time. She appears well-developed and well-nourished. No distress.  HENT:  Head: Normocephalic.  Neck: Normal range of motion.  Cardiovascular: Normal rate, regular rhythm and normal heart sounds.   Pulmonary/Chest: Effort normal and breath sounds normal. No respiratory distress.  Abdominal: Soft. Bowel sounds are normal. There is tenderness (bilateral LLQ tenderness). There is no rebound and no guarding.  Musculoskeletal: Normal range of motion.  Neurological: She is alert and oriented to person, place, and time. No cranial nerve deficit. Coordination normal.  Skin: Skin is warm and dry.  Psychiatric: She has a normal mood and affect. Her behavior is normal. Judgment and thought content  normal.          Assessment & Plan:  Patient provided discharge instructions for UTI and dysuria.  Patient verbalized understanding.  Will return if no improvement.

## 2016-02-27 ENCOUNTER — Telehealth: Payer: Self-pay | Admitting: Internal Medicine

## 2016-02-27 NOTE — Telephone Encounter (Signed)
We have been calling the patient to schedule the event monitor and the patient has not returned our calls.  01/11/2016 LMOM for pt to call and schedule monitor appt. stpegram 12/21/2015 LMOM for pt to call and scheudle monitor appt. stpegram 02/13/2016 LMOM to return call to schedule event monitor.lm

## 2016-04-12 ENCOUNTER — Encounter: Payer: Self-pay | Admitting: Obstetrics and Gynecology

## 2016-08-09 ENCOUNTER — Encounter: Payer: Self-pay | Admitting: Nurse Practitioner

## 2016-08-09 ENCOUNTER — Ambulatory Visit (INDEPENDENT_AMBULATORY_CARE_PROVIDER_SITE_OTHER): Payer: Self-pay | Admitting: Nurse Practitioner

## 2016-08-09 VITALS — BP 112/76 | HR 100 | Temp 98.9°F | Wt 173.2 lb

## 2016-08-09 DIAGNOSIS — G44209 Tension-type headache, unspecified, not intractable: Secondary | ICD-10-CM

## 2016-08-09 DIAGNOSIS — H6983 Other specified disorders of Eustachian tube, bilateral: Secondary | ICD-10-CM

## 2016-08-09 NOTE — Progress Notes (Signed)
Subjective:    Cassandra [name] is a 36 y.o. female who presents for evaluation of headache. Symptoms began about 2 weeks ago. Generally, the headaches last about 2 hours and occur every evening and and upon wakening. The headaches are usually better during the day. The headaches are usually squeezing and are located in occipatal region and around her head.  The patient rates her most severe headaches a 6 on a scale from 1 to 10. Recently, the headaches have been stable. Work attendance or other daily activities are affected by the headaches. Precipitating factors include: not eating. The headaches are usually not preceded by an aura. Associated neurologic symptoms: dizziness, vision problems and whooshing sound in ear, worse in left ear. The patient denies muscle weakness and speech difficulties. Home treatment has included ibuprofen with some improvement. Other history includes: migraine headaches diagnosed in the past. Family history includes no known family members with significant headaches. Patient had an IUD placed in 2014 and noticed headache symptoms.  Patient states she had the IUD removed approximately 1.5 years ago and her symptoms somewhat improved.  The following portions of the patient's history were reviewed and updated as appropriate: allergies, current medications and past medical history.  Review of Systems Constitutional: negative Eyes: positive for blurred vision Ears, nose, mouth, throat, and face: positive for left ear whooshing sound Respiratory: negative Cardiovascular: negative Neurological: positive for dizziness, headaches and weakness, negative for memory problems, paresthesia and seizures    Objective:    BP 112/76 (BP Location: Right Arm, Patient Position: Sitting, Cuff Size: Normal)   Pulse 100   Temp 98.9 F (37.2 C) (Oral)   Wt 173 lb 3.2 oz (78.6 kg)   SpO2 98%   BMI 30.68 kg/m  General appearance: alert, cooperative and mild distress Head: Normocephalic,  without obvious abnormality, atraumatic Eyes: conjunctivae/corneas clear. PERRL, EOM's intact. Fundi benign. Ears: abnormal TM left ear - mucoid middle ear fluid Nose: Nares normal. Septum midline. Mucosa normal. No drainage or sinus tenderness. Throat: lips, mucosa, and tongue normal; teeth and gums normal Lungs: clear to auscultation bilaterally Heart: regular rate and rhythm, S1, S2 normal, no murmur, click, rub or gallop Neurologic: Alert and oriented X 3, normal strength and tone. Normal symmetric reflexes. Normal coordination and gait    Assessment:    Tension-type headache, episodic    Plan:    Lie in darkened room and apply cold packs as needed for pain. Side effect profile discussed in detail. Avoid alcohol, excessive physical activity, heavy lifting and stressful situations as much as possible. Patient reassured that neurodiagnostic workup not indicated from benign H&P.

## 2016-08-09 NOTE — Patient Instructions (Addendum)
Eustachian Tube Dysfunction The eustachian tube connects the middle ear to the back of the nose. It regulates air pressure in the middle ear by allowing air to move between the ear and nose. It also helps to drain fluid from the middle ear space. When the eustachian tube does not function properly, air pressure, fluid, or both can build up in the middle ear. Eustachian tube dysfunction can affect one or both ears. What are the causes? This condition happens when the eustachian tube becomes blocked or cannot open normally. This may result from:  Ear infections.  Colds and other upper respiratory infections.  Allergies.  Irritation, such as from cigarette smoke or acid from the stomach coming up into the esophagus (gastroesophageal reflux).  Sudden changes in air pressure, such as from descending in an airplane.  Abnormal growths in the nose or throat, such as nasal polyps, tumors, or enlarged tissue at the back of the throat (adenoids).  What increases the risk? This condition may be more likely to develop in people who smoke and people who are overweight. Eustachian tube dysfunction may also be more likely to develop in children, especially children who have:  Certain birth defects of the mouth, such as cleft palate.  Large tonsils and adenoids.  What are the signs or symptoms? Symptoms of this condition may include:  A feeling of fullness in the ear.  Ear pain.  Clicking or popping noises in the ear.  Ringing in the ear.  Hearing loss.  Loss of balance.  Symptoms may get worse when the air pressure around you changes, such as when you travel to an area of high elevation or fly on an airplane. How is this diagnosed? This condition may be diagnosed based on:  Your symptoms.  A physical exam of your ear, nose, and throat.  Tests, such as those that measure: ? The movement of your eardrum (tympanogram). ? Your hearing (audiometry).  How is this treated? Treatment  depends on the cause and severity of your condition. If your symptoms are mild, you may be able to relieve your symptoms by moving air into ("popping") your ears. If you have symptoms of fluid in your ears, treatment may include:  Decongestants.  Antihistamines.  Nasal sprays or ear drops that contain medicines that reduce swelling (steroids).  In some cases, you may need to have a procedure to drain the fluid in your eardrum (myringotomy). In this procedure, a small tube is placed in the eardrum to:  Drain the fluid.  Restore the air in the middle ear space.  Follow these instructions at home:  Take over-the-counter and prescription medicines only as told by your health care provider.  Use techniques to help pop your ears as recommended by your health care provider. These may include: ? Chewing gum. ? Yawning. ? Frequent, forceful swallowing. ? Closing your mouth, holding your nose closed, and gently blowing as if you are trying to blow air out of your nose.  Do not do any of the following until your health care provider approves: ? Travel to high altitudes. ? Fly in airplanes. ? Work in a Pension scheme manager or room. ? Scuba dive.  Keep your ears dry. Dry your ears completely after showering or bathing.  Do not smoke.  Keep all follow-up visits as told by your health care provider. This is important.  Benadryl at night.   Contact a health care provider if:  Your symptoms do not go away after treatment.  Your symptoms come  back after treatment.  You are unable to pop your ears.  You have: ? A fever. ? Pain in your ear. ? Pain in your head or neck. ? Fluid draining from your ear.  Your hearing suddenly changes.  You become very dizzy.  You lose your balance. This information is not intended to replace advice given to you by your health care provider. Make sure you discuss any questions you have with your health care provider. Document Released: 03/25/2015  Document Revised: 08/04/2015 Document Reviewed: 03/17/2014 Elsevier Interactive Patient Education  2018 [employer].  Tension Headache A tension headache is a feeling of pain, pressure, or aching that is often felt over the front and sides of the head. The pain can be dull, or it can feel tight (constricting). Tension headaches are not normally associated with nausea or vomiting, and they do not get worse with physical activity. Tension headaches can last from 30 minutes to several days. This is the most common type of headache. CAUSES The exact cause of this condition is not known. Tension headaches often begin after stress, anxiety, or depression. Other triggers may include:  Alcohol.  Too much caffeine, or caffeine withdrawal.  Respiratory infections, such as colds, flu, or sinus infections.  Dental problems or teeth clenching.  Fatigue.  Holding your head and neck in the same position for a long period of time, such as while using a computer.  Smoking. SYMPTOMS Symptoms of this condition include:  A feeling of pressure around the head.  Dull, aching head pain.  Pain felt over the front and sides of the head.  Tenderness in the muscles of the head, neck, and shoulders. DIAGNOSIS This condition may be diagnosed based on your symptoms and a physical exam. Tests may be done, such as a CT scan or an MRI of your head. These tests may be done if your symptoms are severe or unusual. TREATMENT This condition may be treated with lifestyle changes and medicines to help relieve symptoms. HOME CARE INSTRUCTIONS Managing Pain  Take over-the-counter and prescription medicines only as told by your health care provider.  Lie down in a dark, quiet room when you have a headache.  If directed, apply ice to the head and neck area: ? Put ice in a plastic bag. ? Place a towel between your skin and the bag. ? Leave the ice on for 20 minutes, 2-3 times per day.  Use a heating pad or a hot  shower to apply heat to the head and neck area as told by your health care provider.  Ibuprofen 800mg  q 8 hours as needed for pain.  Eating and Drinking  Eat meals on a regular schedule.  Limit alcohol use.  Decrease your caffeine intake, or stop using caffeine. General Instructions  Keep all follow-up visits as told by your health care provider. This is important.  Keep a headache journal to help find out what may trigger your headaches. For example, write down: ? What you eat and drink. ? How much sleep you get. ? Any change to your diet or medicines.  Try massage or other relaxation techniques.  Limit stress.  Sit up straight, and avoid tensing your muscles.  Do not use tobacco products, including cigarettes, chewing tobacco, or e-cigarettes. If you need help quitting, ask your health care provider.  Exercise regularly as told by your health care provider.  Get 7-9 hours of sleep, or the amount recommended by your health care provider. SEEK MEDICAL CARE IF:  Your symptoms are not helped by medicine.  You have a headache that is different from what you normally experience.  You have nausea or you vomit.  You have a fever. SEEK IMMEDIATE MEDICAL CARE IF:  Your headache becomes severe.  You have repeated vomiting.  You have a stiff neck.  You have a loss of vision.  You have problems with speech.  You have pain in your eye or ear.  You have muscular weakness or loss of muscle control.  You lose your balance or you have trouble walking.  You feel faint or you pass out.  You have confusion. This information is not intended to replace advice given to you by your health care provider. Make sure you discuss any questions you have with your health care provider. Document Released: 02/26/2005 Document Revised: 11/17/2014 Document Reviewed: 06/21/2014 Elsevier Interactive Patient Education  2017 [employer].

## 2016-08-10 ENCOUNTER — Telehealth: Payer: Self-pay | Admitting: Nurse Practitioner

## 2016-08-10 NOTE — Telephone Encounter (Signed)
Called patient to follow up on her status.  Patient states she is feeling much better today.  Patient states she has been eating and hydrating herself as well as taking the Ibuprofen.  Patient states she has not taken any Benadryl, but has not had the whooshing sound in her ear since she left the office on 5/31.  Informed patient to follow up as needed.  Patient thanked me for calling.

## 2016-12-16 ENCOUNTER — Encounter (HOSPITAL_COMMUNITY): Payer: Self-pay | Admitting: *Deleted

## 2016-12-16 ENCOUNTER — Ambulatory Visit (HOSPITAL_COMMUNITY)
Admission: EM | Admit: 2016-12-16 | Discharge: 2016-12-16 | Disposition: A | Discharge status: Home / Self Care | Payer: Self-pay | Attending: Internal Medicine | Admitting: Internal Medicine

## 2016-12-16 DIAGNOSIS — R300 Dysuria: Secondary | ICD-10-CM

## 2016-12-16 DIAGNOSIS — N939 Abnormal uterine and vaginal bleeding, unspecified: Secondary | ICD-10-CM | POA: Insufficient documentation

## 2016-12-16 DIAGNOSIS — Z3202 Encounter for pregnancy test, result negative: Secondary | ICD-10-CM

## 2016-12-16 DIAGNOSIS — Z8249 Family history of ischemic heart disease and other diseases of the circulatory system: Secondary | ICD-10-CM | POA: Insufficient documentation

## 2016-12-16 DIAGNOSIS — N938 Other specified abnormal uterine and vaginal bleeding: Secondary | ICD-10-CM

## 2016-12-16 DIAGNOSIS — Z833 Family history of diabetes mellitus: Secondary | ICD-10-CM | POA: Insufficient documentation

## 2016-12-16 LAB — POCT PREGNANCY, URINE: Preg Test, Ur: NEGATIVE

## 2016-12-16 LAB — HCG, QUANTITATIVE, PREGNANCY: hCG, Beta Chain, Quant, S: 4 m[IU]/mL (ref <5)

## 2016-12-16 NOTE — ED Provider Notes (Signed)
[facility]    CSN: [id number] Arrival date & time: 12/16/16  1217     History   Chief Complaint Chief Complaint  Patient presents with  . Vaginal Bleeding  . Dysuria    HPI Cassandra [name] is a 36 y.o. female. She presents today with a couple days history of spotting followed by heavy vaginal bleeding starting yesterday. Has had crampy bilateral lower quadrant pain for the last couple of weeks. She believes she may have conceived on September 19 or 20th, and had a couple of positive (faint) pregnancy tests at home. [facility]. She desires pregnancy. Also has a history of "urinary tract infection" without urinary symptoms, just pelvic crampiness radiating to the low back. No fever, feels okay otherwise.    HPI  Past Medical History:  Diagnosis Date  . Anxiety   . Arrhythmia   . Chicken pox   . Frequent headaches   . Genital warts   . GERD (gastroesophageal reflux disease)   . H/O chlamydia infection   . H/O hematuria   . Heart palpitations   . Nocturnal headaches     Patient Active Problem List   Diagnosis Date Noted  . Costochondritis 12/07/2015  . Epigastric abdominal tenderness without rebound tenderness 05/19/2015  . Constipation 05/19/2015  . Acute cystitis with hematuria 05/19/2015  . IBS (irritable bowel syndrome) 05/19/2015  . Loss of weight 09/21/2014  . Pain in the chest 03/31/2014  . Hypertriglyceridemia 05/15/2013  . Heart palpitations 04/15/2013    Past Surgical History:  Procedure Laterality Date  . NO PAST SURGERIES      OB History    Gravida Para Term Preterm AB Living   7 3 3   4 3    SAB TAB Ectopic Multiple Live Births   3       3       Home Medications    Prior to Admission medications   Medication Sig Start Date End Date Taking? Authorizing Provider  pantoprazole (PROTONIX) 20 MG tablet Take 1 tablet (20 mg total) by mouth daily. Patient not taking: Reported on  08/09/2016 05/04/15   Lorayne Bender, PA-C    Family History Family History  Problem Relation Age of Onset  . Hyperlipidemia Mother   . Hypertension Mother   . Drug abuse Father   . Arthritis Maternal Grandmother   . Hyperlipidemia Maternal Grandmother   . Heart disease Maternal Grandmother   . Hypertension Maternal Grandmother   . Diabetes Maternal Grandmother   . Heart disease Maternal Grandfather   . Hyperlipidemia Sister   . Other Neg Hx        pco  . Migraines Neg Hx     Social History Social History  Substance Use Topics  . Smoking status: Never Smoker  . Smokeless tobacco: Never Used  . Alcohol use 0.0 oz/week     Comment: occasional     Allergies   Patient has no known allergies.   Review of Systems Review of Systems  All other systems reviewed and are negative.    Physical Exam Triage Vital Signs ED Triage Vitals  Enc Vitals Group     BP 12/16/16 1240 123/75     Pulse Rate 12/16/16 1240 73     Resp 12/16/16 1240 17     Temp 12/16/16 1240 98.6 F (37 C)     Temp Source 12/16/16 1240 Oral     SpO2 12/16/16 1240 100 %  Weight --      Height --      Pain Score 12/16/16 1241 5     Pain Loc --    Updated Vital Signs BP 123/75 (BP Location: Right Arm)   Pulse 73   Temp 98.6 F (37 C) (Oral)   Resp 17   SpO2 100%   Physical Exam  Constitutional: She is oriented to person, place, and time. No distress.  HENT:  Head: Atraumatic.  Eyes:  Conjugate gaze observed, no eye redness/discharge  Neck: Neck supple.  Cardiovascular: Normal rate and regular rhythm.   Pulmonary/Chest: No respiratory distress. She has no wheezes. She has no rales.  Lungs clear, symmetric breath sounds  Abdominal: Soft. She exhibits no distension. There is no rebound and no guarding.  Mild tenderness to deep palpation in the left upper quadrant, otherwise abdominal exam is benign  Musculoskeletal: Normal range of motion.  Neurological: She is alert and oriented to person,  place, and time.  Skin: Skin is warm and dry.  Nursing note and vitals reviewed.    UC Treatments / Results  Labs Results for orders placed or performed during the hospital encounter of 12/16/16  Urine culture  Result Value Ref Range   Specimen Description URINE, RANDOM    Special Requests NONE    Culture MULTIPLE SPECIES PRESENT, SUGGEST RECOLLECTION (A)    Report Status 12/17/2016 FINAL   hCG, quantitative, pregnancy  Result Value Ref Range   hCG, Beta Chain, Quant, S 4 <5 mIU/mL  Pregnancy, urine POC  Result Value Ref Range   Preg Test, Ur NEGATIVE NEGATIVE    Procedures Procedures (including critical care time) None today  Final Clinical Impressions(s) / UC Diagnoses   Final diagnoses:  Pregnancy examination or test, negative result  Vaginal bleeding   Blood test for pregnancy today was negative at the urgent care. Would assume that cramping and bleeding are due to a menstrual cycle. Recheck or follow up with your primary care provider or the women's outpatient clinic for further evaluation if symptoms persist.    Controlled Substance Prescriptions [place] Controlled Substance Registry consulted? No   Sherlene Shams, MD 12/17/16 1220

## 2016-12-16 NOTE — ED Triage Notes (Signed)
Patient reports she had several positive pregnancy tests and then for the last 2 weeks has hard severe abdominal cramping. Patient states today she started having heavy bleeding. Patient also reports dysuria.

## 2016-12-16 NOTE — Discharge Instructions (Addendum)
Blood test for pregnancy today was negative at the urgent care. Would assume that cramping and bleeding are due to a menstrual cycle. Recheck or follow up with your primary care provider or the women's outpatient clinic for further evaluation if symptoms persist.

## 2016-12-17 ENCOUNTER — Encounter: Payer: Self-pay | Admitting: Nurse Practitioner

## 2016-12-17 LAB — URINE CULTURE

## 2016-12-20 ENCOUNTER — Encounter (HOSPITAL_COMMUNITY): Payer: Self-pay | Admitting: Emergency Medicine

## 2016-12-20 ENCOUNTER — Ambulatory Visit (HOSPITAL_COMMUNITY)
Admission: EM | Admit: 2016-12-20 | Discharge: 2016-12-20 | Disposition: A | Discharge status: Home / Self Care | Payer: Self-pay | Attending: Family Medicine | Admitting: Family Medicine

## 2016-12-20 ENCOUNTER — Encounter: Payer: Self-pay | Admitting: Nurse Practitioner

## 2016-12-20 DIAGNOSIS — Z3202 Encounter for pregnancy test, result negative: Secondary | ICD-10-CM

## 2016-12-20 DIAGNOSIS — R102 Pelvic and perineal pain: Secondary | ICD-10-CM

## 2016-12-20 DIAGNOSIS — M545 Low back pain, unspecified: Secondary | ICD-10-CM

## 2016-12-20 DIAGNOSIS — R319 Hematuria, unspecified: Secondary | ICD-10-CM

## 2016-12-20 LAB — POCT URINALYSIS DIP (DEVICE)
BILIRUBIN URINE: NEGATIVE
Glucose, UA: NEGATIVE mg/dL
Ketones, ur: NEGATIVE mg/dL
Nitrite: NEGATIVE
PH: 6 (ref 5.0–8.0)
Protein, ur: NEGATIVE mg/dL
Urobilinogen, UA: 0.2 mg/dL (ref 0.0–1.0)

## 2016-12-20 LAB — POCT PREGNANCY, URINE: Preg Test, Ur: NEGATIVE

## 2016-12-20 NOTE — Discharge Instructions (Signed)
The pregnancy test was negative here the blood test and urine from the past and today. No evidence of urinary tract infection at this time. The urine may have been contaminated with scant amount of blood remaining from your period. It is likely that you are having uterine cramping and different sensations of the breast due to changes in hormonal levels and balance. Continue to drink plenty of water and stay well-hydrated and follow-up with your gynecologist.

## 2016-12-20 NOTE — ED Triage Notes (Addendum)
Seen 10/5 for same symptoms.  Patient says she was told there was no uti.  Patient did a home test that showed leukocytes -test was today.  Abdominal cramping, back pain, denies burning with urination, denies any change in urinary habits but says this is typical of her routine with uti.

## 2016-12-20 NOTE — ED Provider Notes (Signed)
[facility]    CSN: [id number] Arrival date & time: 12/20/16  1452     History   Chief Complaint Chief Complaint  Patient presents with  . Back Pain    HPI Cassandra [name] is a 36 y.o. female.   36 year old female returns for days after your visit for similar complaints which include low back pain for 3 weeks. Sometimes it migrates to the upper back. She can follow up when bending forward. No known injury. She denies urinary frequency or urgency or dysuria. She attributes her back pain for possibly urinary tract infection. When she was seen just a few days ago the urine did not support a diagnosis of UTI in the culture showed multiple organisms with no growth.      Past Medical History:  Diagnosis Date  . Anxiety   . Arrhythmia   . Chicken pox   . Frequent headaches   . Genital warts   . GERD (gastroesophageal reflux disease)   . H/O chlamydia infection   . H/O hematuria   . Heart palpitations   . Nocturnal headaches     Patient Active Problem List   Diagnosis Date Noted  . Costochondritis 12/07/2015  . Epigastric abdominal tenderness without rebound tenderness 05/19/2015  . Constipation 05/19/2015  . Acute cystitis with hematuria 05/19/2015  . IBS (irritable bowel syndrome) 05/19/2015  . Loss of weight 09/21/2014  . Pain in the chest 03/31/2014  . Hypertriglyceridemia 05/15/2013  . Heart palpitations 04/15/2013    Past Surgical History:  Procedure Laterality Date  . NO PAST SURGERIES      OB History    Gravida Para Term Preterm AB Living   7 3 3   4 3    SAB TAB Ectopic Multiple Live Births   3       3       Home Medications    Prior to Admission medications   Not on File    Family History Family History  Problem Relation Age of Onset  . Hyperlipidemia Mother   . Hypertension Mother   . Drug abuse Father   . Arthritis Maternal Grandmother   . Hyperlipidemia Maternal Grandmother   . Heart disease Maternal Grandmother   .  Hypertension Maternal Grandmother   . Diabetes Maternal Grandmother   . Heart disease Maternal Grandfather   . Hyperlipidemia Sister   . Other Neg Hx        pco  . Migraines Neg Hx     Social History Social History  Substance Use Topics  . Smoking status: Never Smoker  . Smokeless tobacco: Never Used  . Alcohol use 0.0 oz/week     Comment: occasional     Allergies   Patient has no known allergies.   Review of Systems Review of Systems  Constitutional: Negative.  Negative for fever.  HENT: Negative.   Respiratory: Negative.   Gastrointestinal: Negative.   Genitourinary: Positive for hematuria and pelvic pain. Negative for dysuria, frequency, genital sores, urgency and vaginal discharge.  Neurological: Negative.   All other systems reviewed and are negative.    Physical Exam Triage Vital Signs ED Triage Vitals  Enc Vitals Group     BP 12/20/16 1501 119/80     Pulse Rate 12/20/16 1501 80     Resp 12/20/16 1501 16     Temp 12/20/16 1501 98.3 F (36.8 C)     Temp Source 12/20/16 1501 Oral     SpO2 12/20/16 1501 96 %  Weight 12/20/16 1502 170 lb (77.1 kg)     Height 12/20/16 1502 5\' 3"  (1.6 m)     Head Circumference --      Peak Flow --      Pain Score 12/20/16 1507 2     Pain Loc --      Pain Edu? --      Excl. in [place]? --    No data found.   Updated Vital Signs BP 119/80 (BP Location: Right Arm)   Pulse 80   Temp 98.3 F (36.8 C) (Oral)   Resp 16   Ht 5\' 3"  (1.6 m)   Wt 170 lb (77.1 kg)   SpO2 96%   BMI 30.11 kg/m   Visual Acuity Right Eye Distance:   Left Eye Distance:   Bilateral Distance:    Right Eye Near:   Left Eye Near:    Bilateral Near:     Physical Exam  Constitutional: She is oriented to person, place, and time. She appears well-developed and well-nourished. No distress.  Eyes: EOM are normal.  Neck: Normal range of motion.  Pulmonary/Chest: Effort normal.  Musculoskeletal: Normal range of motion.  Neurological: She is alert  and oriented to person, place, and time.  Skin: Skin is warm and dry.  Nursing note and vitals reviewed.    UC Treatments / Results  Labs (all labs ordered are listed, but only abnormal results are displayed) Labs Reviewed  POCT URINALYSIS DIP (DEVICE) - Abnormal; Notable for the following:       Result Value   Hgb urine dipstick MODERATE (*)    Leukocytes, UA TRACE (*)    All other components within normal limits  POCT PREGNANCY, URINE    EKG  EKG Interpretation None       Radiology No results found.  Procedures Procedures (including critical care time)  Medications Ordered in UC Medications - No data to display   Initial Impression / Assessment and Plan / UC Course  I have reviewed the triage vital signs and the nursing notes.  Pertinent labs & imaging results that were available during my care of the patient were reviewed by me and considered in my medical decision making (see chart for details).    The pregnancy test was negative here the blood test and urine from the past and today. No evidence of urinary tract infection at this time. The urine may have been contaminated with scant amount of blood remaining from your period. It is likely that you are having uterine cramping and different sensations of the breast due to changes in hormonal levels and balance. Continue to drink plenty of water and stay well-hydrated and follow-up with your gynecologist.     Final Clinical Impressions(s) / UC Diagnoses   Final diagnoses:  Acute bilateral low back pain without sciatica    New Prescriptions New Prescriptions   No medications on file     Controlled Substance Prescriptions [place] Controlled Substance Registry consulted? Not Applicable   Janne Napoleon, NP 12/20/16 [phone]

## 2016-12-21 NOTE — Telephone Encounter (Signed)
See message.

## 2016-12-22 NOTE — Telephone Encounter (Signed)
Call patient to inform we have not seen her since Dec. 2017.

## 2017-02-07 ENCOUNTER — Other Ambulatory Visit: Payer: Self-pay

## 2017-02-07 ENCOUNTER — Inpatient Hospital Stay (HOSPITAL_COMMUNITY)
Admission: AD | Admit: 2017-02-07 | Discharge: 2017-02-07 | Disposition: A | Discharge status: Home / Self Care | Payer: Self-pay | Source: Ambulatory Visit | Attending: Obstetrics & Gynecology | Admitting: Obstetrics & Gynecology

## 2017-02-07 ENCOUNTER — Inpatient Hospital Stay (HOSPITAL_COMMUNITY): Payer: Self-pay

## 2017-02-07 ENCOUNTER — Encounter (HOSPITAL_COMMUNITY): Payer: Self-pay | Admitting: *Deleted

## 2017-02-07 DIAGNOSIS — R110 Nausea: Secondary | ICD-10-CM | POA: Insufficient documentation

## 2017-02-07 DIAGNOSIS — R109 Unspecified abdominal pain: Secondary | ICD-10-CM

## 2017-02-07 DIAGNOSIS — Z3A08 8 weeks gestation of pregnancy: Secondary | ICD-10-CM | POA: Insufficient documentation

## 2017-02-07 DIAGNOSIS — O99611 Diseases of the digestive system complicating pregnancy, first trimester: Secondary | ICD-10-CM | POA: Insufficient documentation

## 2017-02-07 DIAGNOSIS — Z3A01 Less than 8 weeks gestation of pregnancy: Secondary | ICD-10-CM

## 2017-02-07 DIAGNOSIS — R102 Pelvic and perineal pain: Secondary | ICD-10-CM | POA: Insufficient documentation

## 2017-02-07 DIAGNOSIS — Z3491 Encounter for supervision of normal pregnancy, unspecified, first trimester: Secondary | ICD-10-CM

## 2017-02-07 DIAGNOSIS — F419 Anxiety disorder, unspecified: Secondary | ICD-10-CM | POA: Insufficient documentation

## 2017-02-07 DIAGNOSIS — O26891 Other specified pregnancy related conditions, first trimester: Secondary | ICD-10-CM | POA: Insufficient documentation

## 2017-02-07 DIAGNOSIS — O99341 Other mental disorders complicating pregnancy, first trimester: Secondary | ICD-10-CM | POA: Insufficient documentation

## 2017-02-07 DIAGNOSIS — K219 Gastro-esophageal reflux disease without esophagitis: Secondary | ICD-10-CM | POA: Insufficient documentation

## 2017-02-07 LAB — ABO/RH: ABO/RH(D): A POS

## 2017-02-07 LAB — URINALYSIS, ROUTINE W REFLEX MICROSCOPIC
Bilirubin Urine: NEGATIVE
GLUCOSE, UA: NEGATIVE mg/dL
HGB URINE DIPSTICK: NEGATIVE
Ketones, ur: NEGATIVE mg/dL
NITRITE: NEGATIVE
Protein, ur: 30 mg/dL — AB
SPECIFIC GRAVITY, URINE: 1.028 (ref 1.005–1.030)
pH: 7 (ref 5.0–8.0)

## 2017-02-07 LAB — WET PREP, GENITAL
CLUE CELLS WET PREP: NONE SEEN
SPERM: NONE SEEN
TRICH WET PREP: NONE SEEN
Yeast Wet Prep HPF POC: NONE SEEN

## 2017-02-07 LAB — CBC
HCT: 36.7 % (ref 36.0–46.0)
HEMOGLOBIN: 12.1 g/dL (ref 12.0–15.0)
MCH: 28.5 pg (ref 26.0–34.0)
MCHC: 33 g/dL (ref 30.0–36.0)
MCV: 86.4 fL (ref 78.0–100.0)
PLATELETS: 301 10*3/uL (ref 150–400)
RBC: 4.25 MIL/uL (ref 3.87–5.11)
RDW: 13.9 % (ref 11.5–15.5)
WBC: 9.8 10*3/uL (ref 4.0–10.5)

## 2017-02-07 LAB — HCG, QUANTITATIVE, PREGNANCY: hCG, Beta Chain, Quant, S: 167238 m[IU]/mL — ABNORMAL HIGH (ref <5)

## 2017-02-07 LAB — POCT PREGNANCY, URINE: Preg Test, Ur: POSITIVE — AB

## 2017-02-07 NOTE — MAU Provider Note (Signed)
History     CSN: [id number]  Arrival date and time: 02/07/17 1115   First Provider Initiated Contact with Patient 02/07/17 1144      Chief Complaint  Patient presents with  . Vaginal Bleeding  . Abdominal Pain  . Possible Pregnancy   HPI Cassandra [name] is a 36 y.o. [id number] at [redacted]w[redacted]d who presents with back pain & vaginal bleeding. Symptoms began 2 weeks ago. Reports intermittent red/brown spotting on toilet paper. Has not had to wear pad & not passing clots. Also reports mild low back pain. Rates pain 1/10. Has not treated. Denies abdominal pain, vaginal discharge, dysuria, or recent intercourse.   OB History    Gravida Para Term Preterm AB Living   8 3 3   4 3    SAB TAB Ectopic Multiple Live Births   3       3      Past Medical History:  Diagnosis Date  . Anxiety   . Arrhythmia   . Chicken pox   . Frequent headaches   . Genital warts   . GERD (gastroesophageal reflux disease)   . H/O chlamydia infection   . H/O hematuria   . Heart palpitations   . Nocturnal headaches     Past Surgical History:  Procedure Laterality Date  . DILATION AND CURETTAGE OF UTERUS      Family History  Problem Relation Age of Onset  . Hyperlipidemia Mother   . Hypertension Mother   . Drug abuse Father   . Arthritis Maternal Grandmother   . Hyperlipidemia Maternal Grandmother   . Heart disease Maternal Grandmother   . Hypertension Maternal Grandmother   . Diabetes Maternal Grandmother   . Heart disease Maternal Grandfather   . Hyperlipidemia Sister   . Heart disease Paternal Grandfather   . Other Neg Hx        pco  . Migraines Neg Hx     Social History   Tobacco Use  . Smoking status: Never Smoker  . Smokeless tobacco: Never Used  Substance Use Topics  . Alcohol use: No    Alcohol/week: 0.0 oz    Frequency: Never    Comment: occasional  . Drug use: No    Comment: former (quit 2007)    Allergies: No Known Allergies  No medications prior to admission.    Review of  Systems  Constitutional: Negative.   Gastrointestinal: Positive for nausea. Negative for abdominal pain, constipation, diarrhea and vomiting.  Genitourinary: Positive for vaginal bleeding. Negative for dysuria and vaginal discharge.  Musculoskeletal: Positive for back pain.   Physical Exam   Blood pressure 130/64, pulse 83, temperature 99 F (37.2 C), temperature source Oral, resp. rate 16, height 5\' 5"  (1.651 m), weight 179 lb 4 oz (81.3 kg), last menstrual period 12/15/2016, SpO2 100 %.  Physical Exam  Nursing note and vitals reviewed. Constitutional: She is oriented to person, place, and time. She appears well-developed and well-nourished. No distress.  HENT:  Head: Normocephalic and atraumatic.  Eyes: Conjunctivae are normal. Right eye exhibits no discharge. Left eye exhibits no discharge. No scleral icterus.  Neck: Normal range of motion.  Respiratory: Effort normal. No respiratory distress.  GI: Soft. There is no tenderness.  Genitourinary: Uterus normal. Cervix exhibits no friability. No bleeding in the vagina. Vaginal discharge found.  Genitourinary Comments: Small amount of tan discharge. No blood on exam. Cervix closed.   Neurological: She is alert and oriented to person, place, and time.  Skin: Skin is warm  and dry. She is not diaphoretic.  Psychiatric: She has a normal mood and affect. Her behavior is normal. Judgment and thought content normal.     MAU Course  Procedures Results for orders placed or performed during the hospital encounter of 02/07/17 (from the past 24 hour(s))  Urinalysis, Routine w reflex microscopic     Status: Abnormal   Collection Time: 02/07/17 11:20 AM  Result Value Ref Range   Color, Urine YELLOW YELLOW   APPearance CLOUDY (A) CLEAR   Specific Gravity, Urine 1.028 1.005 - 1.030   pH 7.0 5.0 - 8.0   Glucose, UA NEGATIVE NEGATIVE mg/dL   Hgb urine dipstick NEGATIVE NEGATIVE   Bilirubin Urine NEGATIVE NEGATIVE   Ketones, ur NEGATIVE NEGATIVE  mg/dL   Protein, ur 30 (A) NEGATIVE mg/dL   Nitrite NEGATIVE NEGATIVE   Leukocytes, UA LARGE (A) NEGATIVE   RBC / HPF 6-30 0 - 5 RBC/hpf   WBC, UA 6-30 0 - 5 WBC/hpf   Bacteria, UA RARE (A) NONE SEEN   Squamous Epithelial / LPF 6-30 (A) NONE SEEN   Mucus PRESENT   Pregnancy, urine POC     Status: Abnormal   Collection Time: 02/07/17 11:39 AM  Result Value Ref Range   Preg Test, Ur POSITIVE (A) NEGATIVE  Wet prep, genital     Status: Abnormal   Collection Time: 02/07/17 12:05 PM  Result Value Ref Range   Yeast Wet Prep HPF POC NONE SEEN NONE SEEN   Trich, Wet Prep NONE SEEN NONE SEEN   Clue Cells Wet Prep HPF POC NONE SEEN NONE SEEN   WBC, Wet Prep HPF POC MANY (A) NONE SEEN   Sperm NONE SEEN   CBC     Status: None   Collection Time: 02/07/17 12:18 PM  Result Value Ref Range   WBC 9.8 4.0 - 10.5 K/uL   RBC 4.25 3.87 - 5.11 MIL/uL   Hemoglobin 12.1 12.0 - 15.0 g/dL   HCT 36.7 36.0 - 46.0 %   MCV 86.4 78.0 - 100.0 fL   MCH 28.5 26.0 - 34.0 pg   MCHC 33.0 30.0 - 36.0 g/dL   RDW 13.9 11.5 - 15.5 %   Platelets 301 150 - 400 K/uL  ABO/Rh     Status: None   Collection Time: 02/07/17 12:18 PM  Result Value Ref Range   ABO/RH(D) A POS   hCG, quantitative, pregnancy     Status: Abnormal   Collection Time: 02/07/17 12:18 PM  Result Value Ref Range   hCG, Beta Chain, Quant, S 167,238 (H) <5 mIU/mL   US Ob Comp Less 14 Wks  Result Date: 02/07/2017 CLINICAL DATA:  Pelvic pain, cramping, and bleeding for 2 weeks. Gestational age by LMP of 7 weeks 5 days. EXAM: OBSTETRIC <14 WK ULTRASOUND TECHNIQUE: Transabdominal ultrasound was performed for evaluation of the gestation as well as the maternal uterus and adnexal regions. COMPARISON:  None. FINDINGS: Intrauterine gestational sac: Single Yolk sac:  Visualized. Embryo:  Visualized. Cardiac Activity: Visualized. Heart Rate: 166 bpm CRL:   18  mm   8 w 2 d                  [place] EDC: 09/17/2017 Subchorionic hemorrhage:  None visualized. Maternal  uterus/adnexae: Both ovaries are normal appearance. No mass or free fluid identified. IMPRESSION: Single living IUP measuring 8 weeks 2 days, with [place] EDC of 09/17/2017. This is concordant with LMP. No significant maternal uterine or adnexal abnormality identified. Electronically  Signed   By: Earle Gell M.D.   On: 02/07/2017 13:29    MDM +UPT UA, wet prep, GC/chlamydia, CBC, ABO/Rh, quant hCG, HIV, and [place] today to rule out ectopic pregnancy A positive Ultrasound shows SIUP with cardiac activity  Assessment and Plan  A: 1. Normal IUP (intrauterine pregnancy) on prenatal ultrasound, first trimester   2. Abdominal pain during pregnancy in first trimester   3. Less than [redacted] weeks gestation of pregnancy    P: Discharge home Start prenatal care Discussed reasons to return to MAU GC/CT pending  Jorje Guild 02/07/2017, 11:58 AM

## 2017-02-07 NOTE — MAU Provider Note (Signed)
History     CSN: [id number]  Arrival date and time: 02/07/17 1115   First Provider Initiated Contact with Patient 02/07/17 1144      Chief Complaint  Patient presents with  . Vaginal Bleeding  . Abdominal Pain  . Possible Pregnancy   HPI Cassandra [name] is a 36 year old female who presents today with intermittent crampy back pain and bleeding/spotting. States the intermittent back pain and bleeding started 2 wks ago. Back pain is a 5/10 when present. She only notices the bleeding when she wipes, blood is covers large portion of tissue paper. Patient also endorses some nausea. Per patient she had +HPT in September but then negative pregnancy test at [facility]. States she had a positive pregnancy test this month at the [facility]. Has a history of miscarriages at 3 months. She denies any chest pain, fever, vomiting, dysuria, hematuria, and diarrhea.   Past Medical History:  Diagnosis Date  . Anxiety   . Arrhythmia   . Chicken pox   . Frequent headaches   . Genital warts   . GERD (gastroesophageal reflux disease)   . H/O chlamydia infection   . H/O hematuria   . Heart palpitations   . Nocturnal headaches     Past Surgical History:  Procedure Laterality Date  . DILATION AND CURETTAGE OF UTERUS      Family History  Problem Relation Age of Onset  . Hyperlipidemia Mother   . Hypertension Mother   . Drug abuse Father   . Arthritis Maternal Grandmother   . Hyperlipidemia Maternal Grandmother   . Heart disease Maternal Grandmother   . Hypertension Maternal Grandmother   . Diabetes Maternal Grandmother   . Heart disease Maternal Grandfather   . Hyperlipidemia Sister   . Heart disease Paternal Grandfather   . Other Neg Hx        pco  . Migraines Neg Hx     Social History   Tobacco Use  . Smoking status: Never Smoker  . Smokeless tobacco: Never Used  Substance Use Topics  . Alcohol use: No    Alcohol/week: 0.0 oz    Frequency: Never    Comment: occasional  . Drug use:  No    Comment: former (quit 2007)    Allergies: No Known Allergies  No medications prior to admission.   Results for orders placed or performed during the hospital encounter of 02/07/17 (from the past 24 hour(s))  Urinalysis, Routine w reflex microscopic     Status: Abnormal   Collection Time: 02/07/17 11:20 AM  Result Value Ref Range   Color, Urine YELLOW YELLOW   APPearance CLOUDY (A) CLEAR   Specific Gravity, Urine 1.028 1.005 - 1.030   pH 7.0 5.0 - 8.0   Glucose, UA NEGATIVE NEGATIVE mg/dL   Hgb urine dipstick NEGATIVE NEGATIVE   Bilirubin Urine NEGATIVE NEGATIVE   Ketones, ur NEGATIVE NEGATIVE mg/dL   Protein, ur 30 (A) NEGATIVE mg/dL   Nitrite NEGATIVE NEGATIVE   Leukocytes, UA LARGE (A) NEGATIVE   RBC / HPF 6-30 0 - 5 RBC/hpf   WBC, UA 6-30 0 - 5 WBC/hpf   Bacteria, UA RARE (A) NONE SEEN   Squamous Epithelial / LPF 6-30 (A) NONE SEEN   Mucus PRESENT   Pregnancy, urine POC     Status: Abnormal   Collection Time: 02/07/17 11:39 AM  Result Value Ref Range   Preg Test, Ur POSITIVE (A) NEGATIVE  Wet prep, genital     Status: Abnormal  Collection Time: 02/07/17 12:05 PM  Result Value Ref Range   Yeast Wet Prep HPF POC NONE SEEN NONE SEEN   Trich, Wet Prep NONE SEEN NONE SEEN   Clue Cells Wet Prep HPF POC NONE SEEN NONE SEEN   WBC, Wet Prep HPF POC MANY (A) NONE SEEN   Sperm NONE SEEN   CBC     Status: None   Collection Time: 02/07/17 12:18 PM  Result Value Ref Range   WBC 9.8 4.0 - 10.5 K/uL   RBC 4.25 3.87 - 5.11 MIL/uL   Hemoglobin 12.1 12.0 - 15.0 g/dL   HCT 36.7 36.0 - 46.0 %   MCV 86.4 78.0 - 100.0 fL   MCH 28.5 26.0 - 34.0 pg   MCHC 33.0 30.0 - 36.0 g/dL   RDW 13.9 11.5 - 15.5 %   Platelets 301 150 - 400 K/uL  ABO/Rh     Status: None (Preliminary result)   Collection Time: 02/07/17 12:18 PM  Result Value Ref Range   ABO/RH(D) A POS     Review of Systems  All pertinent positives and negatives listed in HPI Physical Exam   Blood pressure 130/64,  pulse 83, temperature 99 F (37.2 C), temperature source Oral, resp. rate 16, height 5\' 5"  (1.651 m), weight 81.3 kg (179 lb 4 oz), last menstrual period 12/15/2016, SpO2 100 %.  Physical Exam  Constitutional: She appears well-nourished. No distress.  HENT:  Head: Normocephalic and atraumatic.  Eyes: Pupils are equal, round, and reactive to light. Right eye exhibits no discharge. Left eye exhibits no discharge.  Cardiovascular: Normal rate, regular rhythm and normal heart sounds.  Respiratory: Effort normal and breath sounds normal.  GI: Soft. She exhibits no distension and no mass. There is no tenderness. There is no rebound and no guarding.  Genitourinary: There is no rash, tenderness, lesion or injury on the right labia. There is no rash, tenderness, lesion or injury on the left labia. No tenderness or bleeding in the vagina. No foreign body in the vagina. No signs of injury around the vagina. Vaginal discharge (Speculum exam showed thin yellow discharge in vaginal cannal. Cervix was parous and 1cm ectropian  nonfriable without lesions. On bimanuel  no cerival motion tenderness and cervix is firm . ) found.  Musculoskeletal: Normal range of motion.  Neurological: She is alert.  Skin: Skin is warm and dry. No rash noted. She is not diaphoretic. No erythema. No pallor.  Psychiatric: She has a normal mood and affect. Her behavior is normal.    MAU Course  Procedures None   MDM UA HCG quantitative  CBC Transvaginal US  ABO/Rh   Assessment and Plan  Assessment 1. Pregnancy unknown trimester   -Discussed need for prenatal care and locations in [place] area  -Started prenatal vitamins  -Discussed bleeding precautions and signs/symptoms of when to return to MAU or ER  -Discharged in health condintion  Cassandra [name] 02/07/2017, 11:58 AM

## 2017-02-07 NOTE — Discharge Instructions (Signed)
Vaginal Bleeding During Pregnancy, First Trimester A small amount of bleeding (spotting) from the vagina is relatively common in early pregnancy. It usually stops on its own. Various things may cause bleeding or spotting in early pregnancy. Some bleeding may be related to the pregnancy, and some may not. In most cases, the bleeding is normal and is not a problem. However, bleeding can also be a sign of something serious. Be sure to tell your health care provider about any vaginal bleeding right away. Some possible causes of vaginal bleeding during the first trimester include:  Infection or inflammation of the cervix.  Growths (polyps) on the cervix.  Miscarriage or threatened miscarriage.  Pregnancy tissue has developed outside of the uterus and in a fallopian tube (tubal pregnancy).  Tiny cysts have developed in the uterus instead of pregnancy tissue (molar pregnancy).  Follow these instructions at home: Watch your condition for any changes. The following actions may help to lessen any discomfort you are feeling:  Follow your health care provider's instructions for limiting your activity. If your health care provider orders bed rest, you may need to stay in bed and only get up to use the bathroom. However, your health care provider may allow you to continue light activity.  If needed, make plans for someone to help with your regular activities and responsibilities while you are on bed rest.  Keep track of the number of pads you use each day, how often you change pads, and how soaked (saturated) they are. Write this down.  Do not use tampons. Do not douche.  Do not have sexual intercourse or orgasms until approved by your health care provider.  If you pass any tissue from your vagina, save the tissue so you can show it to your health care provider.  Only take over-the-counter or prescription medicines as directed by your health care provider.  Do not take aspirin because it can make  you bleed.  Keep all follow-up appointments as directed by your health care provider.  Contact a health care provider if:  You have any vaginal bleeding during any part of your pregnancy.  You have cramps or labor pains.  You have a fever, not controlled by medicine. Get help right away if:  You have severe cramps in your back or belly (abdomen).  You pass large clots or tissue from your vagina.  Your bleeding increases.  You feel light-headed or weak, or you have fainting episodes.  You have chills.  You are leaking fluid or have a gush of fluid from your vagina.  You pass out while having a bowel movement. This information is not intended to replace advice given to you by your health care provider. Make sure you discuss any questions you have with your health care provider. Document Released: 12/06/2004 Document Revised: 08/04/2015 Document Reviewed: 11/03/2012 Elsevier Interactive Patient Education  2018 [place] [facility] [place] at [facility]       Phone: [phone]  [facility] [place] [facility] [place] Phone: [place] for [employer] at [place]  Phone: [place] for [place] at [facility]  Phone: [place] for [place] at [place]  Phone: [facility]       Phone: [phone]  [facility]    Phone: [phone]   [facility])    Phone: [facility]  Phone: [phone]  [facility]    Phone: [phone]   [facility]  Phone: [phone]  [place]    Phone: [phone]  [facility]   Phone: [phone]  [facility]    Phone: [phone]

## 2017-02-07 NOTE — MAU Note (Signed)
Been cramping, and getting a strong pull in her back, for a while- every 2-3 days.  Is bleeding or spotting, started 2 wks ago.  (none today).  Hasn't been seen yet.  +HPT in Sept/ neg at [facility]. Started bleeding 2 days after that (Sept), stopped 3 days later.  +test at health dept in Nov.

## 2017-02-08 LAB — GC/CHLAMYDIA PROBE AMP (~~LOC~~) NOT AT ARMC
Chlamydia: NEGATIVE
NEISSERIA GONORRHEA: NEGATIVE

## 2017-03-12 NOTE — L&D Delivery Note (Addendum)
Patient is a 37 y.o. now [id number] s/p NSVD at [redacted]w[redacted]d, who was admitted for elective IOL.  She progressed with augmentation (pitocin) to complete and pushed 4 minutes to deliver.  Cord clamping delayed by 1 minutes then clamped by me with supervision and cut by FOB.  Placenta intact and spontaneous, bleeding minimal.  No lacerations noted. She is undecided for birth control, considering vasectomy.  Delivery Note At 5:12 PM a viable female was delivered via Vaginal, Spontaneous (Presentation: LOA) in usual fashion.  APGAR: 9, 9; weight  pending.   Placenta status: intact.  Cord: 3V with the following complications: nuchal x1.  Cord pH: N/A  Anesthesia:  Epidural Episiotomy:  None Lacerations:  None Suture Repair: None Est. Blood Loss (mL):  50  Mom to postpartum.  Baby to Couplet care / Skin to Skin.  Rory Percy, DO 09/21/17, 5:44 PM  Midwife attestation: I was gloved and present for delivery in its entirety and I agree with the above resident's note.  Julianne Handler, CNM 7:27 PM

## 2017-03-13 ENCOUNTER — Inpatient Hospital Stay (HOSPITAL_COMMUNITY)
Admission: AD | Admit: 2017-03-13 | Discharge: 2017-03-13 | Disposition: A | Discharge status: Home / Self Care | Payer: Self-pay | Source: Ambulatory Visit | Attending: Obstetrics & Gynecology | Admitting: Obstetrics & Gynecology

## 2017-03-13 ENCOUNTER — Encounter (HOSPITAL_COMMUNITY): Payer: Self-pay | Admitting: *Deleted

## 2017-03-13 ENCOUNTER — Other Ambulatory Visit: Payer: Self-pay

## 2017-03-13 DIAGNOSIS — O99341 Other mental disorders complicating pregnancy, first trimester: Secondary | ICD-10-CM | POA: Insufficient documentation

## 2017-03-13 DIAGNOSIS — G43719 Chronic migraine without aura, intractable, without status migrainosus: Secondary | ICD-10-CM

## 2017-03-13 DIAGNOSIS — G43709 Chronic migraine without aura, not intractable, without status migrainosus: Secondary | ICD-10-CM | POA: Insufficient documentation

## 2017-03-13 DIAGNOSIS — N760 Acute vaginitis: Secondary | ICD-10-CM

## 2017-03-13 DIAGNOSIS — O23591 Infection of other part of genital tract in pregnancy, first trimester: Secondary | ICD-10-CM | POA: Insufficient documentation

## 2017-03-13 DIAGNOSIS — M545 Low back pain: Secondary | ICD-10-CM | POA: Insufficient documentation

## 2017-03-13 DIAGNOSIS — F419 Anxiety disorder, unspecified: Secondary | ICD-10-CM | POA: Insufficient documentation

## 2017-03-13 DIAGNOSIS — O99611 Diseases of the digestive system complicating pregnancy, first trimester: Secondary | ICD-10-CM | POA: Insufficient documentation

## 2017-03-13 DIAGNOSIS — B9689 Other specified bacterial agents as the cause of diseases classified elsewhere: Secondary | ICD-10-CM

## 2017-03-13 DIAGNOSIS — K219 Gastro-esophageal reflux disease without esophagitis: Secondary | ICD-10-CM | POA: Insufficient documentation

## 2017-03-13 DIAGNOSIS — O99351 Diseases of the nervous system complicating pregnancy, first trimester: Secondary | ICD-10-CM | POA: Insufficient documentation

## 2017-03-13 DIAGNOSIS — O26891 Other specified pregnancy related conditions, first trimester: Secondary | ICD-10-CM | POA: Insufficient documentation

## 2017-03-13 DIAGNOSIS — Z79899 Other long term (current) drug therapy: Secondary | ICD-10-CM | POA: Insufficient documentation

## 2017-03-13 DIAGNOSIS — Z3A12 12 weeks gestation of pregnancy: Secondary | ICD-10-CM | POA: Insufficient documentation

## 2017-03-13 DIAGNOSIS — R109 Unspecified abdominal pain: Secondary | ICD-10-CM | POA: Insufficient documentation

## 2017-03-13 LAB — URINALYSIS, ROUTINE W REFLEX MICROSCOPIC
BILIRUBIN URINE: NEGATIVE
Glucose, UA: NEGATIVE mg/dL
Ketones, ur: NEGATIVE mg/dL
Nitrite: NEGATIVE
PH: 5 (ref 5.0–8.0)
Protein, ur: NEGATIVE mg/dL
Specific Gravity, Urine: 1.023 (ref 1.005–1.030)

## 2017-03-13 LAB — WET PREP, GENITAL
Sperm: NONE SEEN
Trich, Wet Prep: NONE SEEN
Yeast Wet Prep HPF POC: NONE SEEN

## 2017-03-13 MED ORDER — BUTALBITAL-APAP-CAFFEINE 50-325-40 MG PO TABS: 1.0000 | ORAL_TABLET | Freq: Once | ORAL | Status: DC

## 2017-03-13 MED ORDER — METRONIDAZOLE 500 MG PO TABS: 500.0000 mg | ORAL_TABLET | Freq: Two times a day (BID) | ORAL | 0 refills | Status: DC

## 2017-03-13 MED ORDER — CYCLOBENZAPRINE HCL 10 MG PO TABS
10.0000 mg | ORAL_TABLET | Freq: Once | ORAL | Status: DC
  Filled 2017-03-13: qty 1

## 2017-03-13 MED ORDER — ACETAMINOPHEN 325 MG PO TABS: 1000.0000 mg | ORAL_TABLET | Freq: Four times a day (QID) | ORAL | Status: DC | PRN

## 2017-03-13 MED ORDER — CYCLOBENZAPRINE HCL 10 MG PO TABS: 10.0000 mg | ORAL_TABLET | Freq: Two times a day (BID) | ORAL | 0 refills | Status: DC | PRN

## 2017-03-13 MED ORDER — ACETAMINOPHEN 500 MG PO TABS
1000.0000 mg | ORAL_TABLET | Freq: Once | ORAL | Status: AC
  Administered 2017-03-13: 1000 mg via ORAL
  Filled 2017-03-13: qty 2

## 2017-03-13 NOTE — MAU Note (Signed)
Been having migraines for awhile.  Had one for 4 days straight.none right now.  Really came in though because of back ache and cramping.

## 2017-03-13 NOTE — MAU Provider Note (Signed)
Chief Complaint: Abdominal Pain; Back Pain; and Headache   First Provider Initiated Contact with Patient 03/13/17 2007      SUBJECTIVE HPI: Cassandra [name] is a 37 y.o. [id number] at [redacted]w[redacted]d by LMP who presents to maternity admissions reporting abdominal pain and back pain. She reports a history of back pain during pregnancies. She has a history of sciatica- has not taken any medication today for back pain. Back pain has been occurring "since she got pregnant". She rates pain as a 6/10- unable to take medication for back pain because she drove herself to MAU. She reports abdominal pain that she describes as intermittent cramping that began yesterday. She reports taking 325mg  tylenol once with mild relief. She reports having a migraine for the last 4 days, does not currently have a migraine or headache. She denies vaginal bleeding, vaginal itching/burning, urinary symptoms, dizziness, n/v, or fever/chills.    Past Medical History:  Diagnosis Date  . Anxiety   . Arrhythmia   . Chicken pox   . Frequent headaches   . Genital warts   . GERD (gastroesophageal reflux disease)   . H/O chlamydia infection   . H/O hematuria   . Heart palpitations   . Nocturnal headaches    Past Surgical History:  Procedure Laterality Date  . DILATION AND CURETTAGE OF UTERUS     Social History   Socioeconomic History  . Marital status: Married    Spouse name: Jamel  . Number of children: 3  . Years of education: 69  . Highest education level: Not on file  Social Needs  . Financial resource strain: Not on file  . Food insecurity - worry: Not on file  . Food insecurity - inability: Not on file  . Transportation needs - medical: Not on file  . Transportation needs - non-medical: Not on file  Occupational History  . Not on file  Tobacco Use  . Smoking status: Never Smoker  . Smokeless tobacco: Never Used  Substance and Sexual Activity  . Alcohol use: No    Alcohol/week: 0.0 oz    Frequency: Never   Comment: occasional  . Drug use: No    Comment: former (quit 2007)  . Sexual activity: Yes    Partners: Male    Birth control/protection: Condom  Other Topics Concern  . Not on file  Social History Narrative   Epworth Sleepiness Scale = 2 (01/13/2015)Lives with husband and 3 children   Caffeine use: 1-2 coffee per week.    No current facility-administered medications on file prior to encounter.    Current Outpatient Medications on File Prior to Encounter  Medication Sig Dispense Refill  . acetaminophen (TYLENOL) 325 MG tablet Take 650 mg by mouth every 6 (six) hours as needed.    . Prenatal Vit-Fe Fumarate-FA (PRENATAL MULTIVITAMIN) TABS tablet Take 1 tablet by mouth daily at 12 noon.     No Known Allergies  ROS:  Review of Systems  Constitutional: Negative.   Respiratory: Negative.   Cardiovascular: Negative.   Gastrointestinal: Positive for abdominal pain. Negative for constipation, diarrhea, nausea and vomiting.  Genitourinary: Negative for difficulty urinating, dysuria, frequency, pelvic pain, urgency, vaginal bleeding, vaginal discharge and vaginal pain.  Musculoskeletal: Positive for back pain.  Neurological: Negative.   Psychiatric/Behavioral: Negative.    I have reviewed patient's Past Medical Hx, Surgical Hx, Family Hx, Social Hx, medications and allergies.   Physical Exam   Patient Vitals for the past 24 hrs:  BP Temp Temp src Pulse Resp  SpO2 Weight  03/13/17 2142 123/69 98.3 F (36.8 C) Oral 81 17 - -  03/13/17 2105 128/75 98.3 F (36.8 C) Oral 79 17 - -  03/13/17 1753 124/61 98.3 F (36.8 C) Oral 90 18 100 % 183 lb 4 oz (83.1 kg)   Constitutional: Well-developed, well-nourished female in no acute distress.  Cardiovascular: normal rate Respiratory: normal effort GI: Abd soft, non-tender. Pos BS x 4 MS: Extremities nontender, no edema, normal ROM Neurologic: Alert and oriented x 4.  GU: Neg CVAT.  PELVIC EXAM: Cervix pink, visually closed, without lesion,  moderate white creamy discharge with no odor, vaginal walls and external genitalia normal Bimanual exam: Cervix 0/long/high, firm, anterior, neg CMT, uterus nontender, nonenlarged, adnexa without tenderness, enlargement, or mass  FHT 165 by doppler  LAB RESULTS Results for orders placed or performed during the hospital encounter of 03/13/17 (from the past 24 hour(s))  Urinalysis, Routine w reflex microscopic     Status: Abnormal   Collection Time: 03/13/17  5:55 PM  Result Value Ref Range   Color, Urine YELLOW YELLOW   APPearance HAZY (A) CLEAR   Specific Gravity, Urine 1.023 1.005 - 1.030   pH 5.0 5.0 - 8.0   Glucose, UA NEGATIVE NEGATIVE mg/dL   Hgb urine dipstick SMALL (A) NEGATIVE   Bilirubin Urine NEGATIVE NEGATIVE   Ketones, ur NEGATIVE NEGATIVE mg/dL   Protein, ur NEGATIVE NEGATIVE mg/dL   Nitrite NEGATIVE NEGATIVE   Leukocytes, UA MODERATE (A) NEGATIVE   RBC / HPF 6-30 0 - 5 RBC/hpf   WBC, UA 0-5 0 - 5 WBC/hpf   Bacteria, UA RARE (A) NONE SEEN   Squamous Epithelial / LPF 0-5 (A) NONE SEEN   Mucus PRESENT   Wet prep, genital     Status: Abnormal   Collection Time: 03/13/17  8:11 PM  Result Value Ref Range   Yeast Wet Prep HPF POC NONE SEEN NONE SEEN   Trich, Wet Prep NONE SEEN NONE SEEN   Clue Cells Wet Prep HPF POC PRESENT (A) NONE SEEN   WBC, Wet Prep HPF POC MANY (A) NONE SEEN   Sperm NONE SEEN     --/--/A POS (11/29 1218)   MAU Management/MDM: Orders Placed This Encounter  Procedures  . Culture, OB Urine  . Wet prep, genital  . Urinalysis, Routine w reflex microscopic  Wet prep- showed bacterial vaginosis, discussed with patient results of testing and options for treatment, patient agrees to treatment with Flagyl.   Meds ordered this encounter  Medications  . DISCONTD: butalbital-acetaminophen-caffeine (FIORICET, ESGIC) 50-325-40 MG per tablet 1 tablet  . DISCONTD: cyclobenzaprine (FLEXERIL) tablet 10 mg  . acetaminophen (TYLENOL) tablet 1,000 mg  .  acetaminophen (TYLENOL) 325 MG tablet    Sig: Take 3 tablets (975 mg total) by mouth every 6 (six) hours as needed.    Order Specific Question:   Supervising Provider    Answer:   Marjory Lies  . cyclobenzaprine (FLEXERIL) 10 MG tablet    Sig: Take 1 tablet (10 mg total) by mouth 2 (two) times daily as needed for muscle spasms.    Dispense:  15 tablet    Refill:  0    Order Specific Question:   Supervising Provider    Answer:   Jonnie Kind [[id number]]  . metroNIDAZOLE (FLAGYL) 500 MG tablet    Sig: Take 1 tablet (500 mg total) by mouth 2 (two) times daily.    Dispense:  14 tablet    Refill:  0    Order Specific Question:   Supervising Provider    Answer:   Jonnie Kind [[id number]]   Treatments in MAU included Flexeril 10mg  for back pain- patient unable to take medication in MAU due to driving, 1,000mg  Tylenol- pain relieved with Tylenol. Pt discharged with instructions to make appointment for initial visit- list of providers given to patient.  ASSESSMENT 1. Abdominal pain during pregnancy in first trimester   2. Low back pain during pregnancy in first trimester   3. Intractable chronic migraine without aura and without status migrainosus   4. BV (bacterial vaginosis)     PLAN Discharge home Rx for Flagyl for BV  Return to MAU as needed for emergencies  Make initial prenatal appointment as soon as possible   Allergies as of 03/13/2017   No Known Allergies     Medication List    TAKE these medications   acetaminophen 325 MG tablet Commonly known as:  TYLENOL Take 3 tablets (975 mg total) by mouth every 6 (six) hours as needed. What changed:  how much to take   cyclobenzaprine 10 MG tablet Commonly known as:  FLEXERIL Take 1 tablet (10 mg total) by mouth 2 (two) times daily as needed for muscle spasms.   metroNIDAZOLE 500 MG tablet Commonly known as:  FLAGYL Take 1 tablet (500 mg total) by mouth 2 (two) times daily.   prenatal multivitamin Tabs tablet Take 1  tablet by mouth daily at 12 noon.      Darrol Poke  Certified Nurse-Midwife 03/13/2017  8:28 PM

## 2017-03-14 LAB — GC/CHLAMYDIA PROBE AMP (~~LOC~~) NOT AT ARMC
Chlamydia: NEGATIVE
Neisseria Gonorrhea: NEGATIVE

## 2017-03-15 LAB — CULTURE, OB URINE: Culture: <10000 — AB

## 2017-03-22 ENCOUNTER — Ambulatory Visit (INDEPENDENT_AMBULATORY_CARE_PROVIDER_SITE_OTHER): Payer: Self-pay | Admitting: Obstetrics and Gynecology

## 2017-03-22 ENCOUNTER — Encounter: Payer: Self-pay | Admitting: Obstetrics and Gynecology

## 2017-03-22 ENCOUNTER — Other Ambulatory Visit: Payer: Self-pay

## 2017-03-22 VITALS — BP 122/68 | HR 91 | Wt 186.0 lb

## 2017-03-22 DIAGNOSIS — Z3490 Encounter for supervision of normal pregnancy, unspecified, unspecified trimester: Secondary | ICD-10-CM

## 2017-03-22 DIAGNOSIS — O0990 Supervision of high risk pregnancy, unspecified, unspecified trimester: Secondary | ICD-10-CM

## 2017-03-22 DIAGNOSIS — Z1151 Encounter for screening for human papillomavirus (HPV): Secondary | ICD-10-CM

## 2017-03-22 DIAGNOSIS — O09521 Supervision of elderly multigravida, first trimester: Secondary | ICD-10-CM

## 2017-03-22 DIAGNOSIS — O09529 Supervision of elderly multigravida, unspecified trimester: Secondary | ICD-10-CM | POA: Insufficient documentation

## 2017-03-22 DIAGNOSIS — Z124 Encounter for screening for malignant neoplasm of cervix: Secondary | ICD-10-CM

## 2017-03-22 DIAGNOSIS — O0991 Supervision of high risk pregnancy, unspecified, first trimester: Secondary | ICD-10-CM

## 2017-03-22 DIAGNOSIS — Z113 Encounter for screening for infections with a predominantly sexual mode of transmission: Secondary | ICD-10-CM

## 2017-03-22 DIAGNOSIS — O09522 Supervision of elderly multigravida, second trimester: Secondary | ICD-10-CM

## 2017-03-22 HISTORY — DX: Encounter for supervision of normal pregnancy, unspecified, unspecified trimester: Z34.90

## 2017-03-22 LAB — POCT URINALYSIS DIP (DEVICE)
Bilirubin Urine: NEGATIVE
GLUCOSE, UA: NEGATIVE mg/dL
Ketones, ur: 40 mg/dL — AB
NITRITE: NEGATIVE
Protein, ur: NEGATIVE mg/dL
Urobilinogen, UA: 1 mg/dL (ref 0.0–1.0)
pH: 6 (ref 5.0–8.0)

## 2017-03-22 NOTE — Progress Notes (Signed)
Subjective:  Cassandra [name] is a 37 y.o. [phone] at [redacted]w[redacted]d being seen today for initial visit for prenatal care.  She is currently monitored for the following issues for this high-risk pregnancy and has Heart palpitations; Hypertriglyceridemia; Loss of weight; Constipation; IBS (irritable bowel syndrome); Supervision of high risk pregnancy, antepartum; and AMA (advanced maternal age) multigravida 35+ on their problem list.  Patient reports no complaints.  Vag. Bleeding: Scant. Denies leaking of fluid.   Past Medical History:  Diagnosis Date  . Anxiety   . Arrhythmia   . Frequent headaches   . Genital warts   . GERD (gastroesophageal reflux disease)   . H/O chlamydia infection   . H/O hematuria   . Heart palpitations   . Nocturnal headaches    Past Surgical History:  Procedure Laterality Date  . DILATION AND CURETTAGE OF UTERUS     OB History  Gravida Para Term Preterm AB Living  8 3 3  0 4 3  SAB TAB Ectopic Multiple Live Births  3 1 0 0 3    # Outcome Date GA Lbr Len/2nd Weight Sex Delivery Anes PTL Lv  8 Current           7 Term 03/02/12   8 lb 1 oz (3.657 kg) F Vag-Spont EPI  LIV  6 Term 11/24/07   7 lb 11 oz (3.487 kg) F Vag-Spont EPI  LIV  5 SAB 2007        FD  4 Term 10/28/03 [redacted]w[redacted]d  7 lb 1 oz (3.204 kg) F Vag-Spont EPI  LIV  3 SAB 2004        FD  2 SAB 2003        FD  1 TAB 08/1998            Obstetric Comments  SABs all around 3 months   Current Outpatient Medications on File Prior to Visit  Medication Sig Dispense Refill  . metroNIDAZOLE (FLAGYL) 500 MG tablet Take 1 tablet (500 mg total) by mouth 2 (two) times daily. 14 tablet 0  . Prenatal Vit-Fe Fumarate-FA (PRENATAL MULTIVITAMIN) TABS tablet Take 1 tablet by mouth daily at 12 noon.    Marland Kitchen acetaminophen (TYLENOL) 325 MG tablet Take 3 tablets (975 mg total) by mouth every 6 (six) hours as needed. (Patient not taking: Reported on 03/22/2017)    . cyclobenzaprine (FLEXERIL) 10 MG tablet Take 1 tablet (10 mg total) by  mouth 2 (two) times daily as needed for muscle spasms. (Patient not taking: Reported on 03/22/2017) 15 tablet 0   No current facility-administered medications on file prior to visit.     The following portions of the patient's history were reviewed and updated as appropriate: allergies, current medications, past family history, past medical history, past social history, past surgical history and problem list. Problem list updated.  Objective:   Vitals:   03/22/17 1624  BP: 122/68  Pulse: 91  Weight: 186 lb (84.4 kg)    Fetal Status: Fetal Heart Rate (bpm): 160         General Appearance:    Alert, cooperative, no distress, appears stated age  Head:    Normocephalic, without obvious abnormality, atraumatic  Eyes:    PERRL, conjunctiva/corneas clear, EOM's intact  Ears:    Normal external ear canals, both ears  Nose:   Nares normal, mucosa normal, no drainage or sinus tenderness  Throat:   Lips, mucosa, and tongue normal; teeth and gums normal  Neck:   Supple, symmetrical,  trachea midline, no adenopathy;    thyroid:  no enlargement/tenderness/nodules; no carotid   bruit or JVD  Back:     Symmetric, no curvature, ROM normal, no CVA tenderness  Lungs:     Clear to auscultation bilaterally, respirations unlabored  Chest Wall:    No tenderness or deformity   Heart:    Regular rate and rhythm, S1 and S2 normal, no murmur, rub   or gallop  Breast Exam:    No tenderness, masses, or nipple abnormality  Abdomen:     Soft, non-tender, bowel sounds active all four quadrants,    no masses, no organomegaly  Genitalia:    Normal female without lesion, discharge or tenderness  Extremities:   Extremities normal, atraumatic, no cyanosis or edema  Pulses:   2+ and symmetric all extremities  Skin:   Skin color, texture, turgor normal, no rashes or lesions  Lymph nodes:   Cervical, supraclavicular, and axillary nodes normal  Neurologic:   CNII-XII grossly intact, normal strength, sensation and  reflexes throughout    Mental status: Normal mood and affect. Norma Mental Status: Normal mood and affect. Normal behavior. Normal judgment and thought content.   Urinalysis:      Assessment and Plan:  Pregnancy: [id number] at [redacted]w[redacted]d  1. Supervision of high risk pregnancy, antepartum - Enroll patient in Babyscripts Program - Obstetric Panel, Including HIV - Hemoglobinopathy Evaluation - Culture, OB Urine - Cytology - PAP -Desires genetic testing, obtain at next OB visit -declined flu  2. Elderly multigravida in second trimester High risk due to age. Will need antenatal testing as her pregnancy progresses.  General obstetric precautions including but not limited to vaginal bleeding, contractions, leaking of fluid and fetal movement were reviewed in detail with the patient. Please refer to After Visit Summary for other counseling recommendations.  Return for ob visit. in 4 weeks   Katheren Shams, DO

## 2017-03-22 NOTE — Patient Instructions (Signed)
Second Trimester of Pregnancy The second trimester is from week 13 through week 28, month 4 through 6. This is often the time in pregnancy that you feel your best. Often times, morning sickness has lessened or quit. You may have more energy, and you may get hungry more often. Your unborn baby (fetus) is growing rapidly. At the end of the sixth month, he or she is about 9 inches long and weighs about 1 pounds. You will likely feel the baby move (quickening) between 18 and 20 weeks of pregnancy. Follow these instructions at home:  Avoid all smoking, herbs, and alcohol. Avoid drugs not approved by your doctor.  Do not use any tobacco products, including cigarettes, chewing tobacco, and electronic cigarettes. If you need help quitting, ask your doctor. You may get counseling or other support to help you quit.  Only take medicine as told by your doctor. Some medicines are safe and some are not during pregnancy.  Exercise only as told by your doctor. Stop exercising if you start having cramps.  Eat regular, healthy meals.  Wear a good support bra if your breasts are tender.  Do not use hot tubs, steam rooms, or saunas.  Wear your seat belt when driving.  Avoid raw meat, uncooked cheese, and liter boxes and soil used by cats.  Take your prenatal vitamins.  Take 1500-2000 milligrams of calcium daily starting at the 20th week of pregnancy until you deliver your baby.  Try taking medicine that helps you poop (stool softener) as needed, and if your doctor approves. Eat more fiber by eating fresh fruit, vegetables, and whole grains. Drink enough fluids to keep your pee (urine) clear or pale yellow.  Take warm water baths (sitz baths) to soothe pain or discomfort caused by hemorrhoids. Use hemorrhoid cream if your doctor approves.  If you have puffy, bulging veins (varicose veins), wear support hose. Raise (elevate) your feet for 15 minutes, 3-4 times a day. Limit salt in your diet.  Avoid heavy  lifting, wear low heals, and sit up straight.  Rest with your legs raised if you have leg cramps or low back pain.  Visit your dentist if you have not gone during your pregnancy. Use a soft toothbrush to brush your teeth. Be gentle when you floss.  You can have sex (intercourse) unless your doctor tells you not to.  Go to your doctor visits. Get help if:  You feel dizzy.  You have mild cramps or pressure in your lower belly (abdomen).  You have a nagging pain in your belly area.  You continue to feel sick to your stomach (nauseous), throw up (vomit), or have watery poop (diarrhea).  You have bad smelling fluid coming from your vagina.  You have pain with peeing (urination). Get help right away if:  You have a fever.  You are leaking fluid from your vagina.  You have spotting or bleeding from your vagina.  You have severe belly cramping or pain.  You lose or gain weight rapidly.  You have trouble catching your breath and have chest pain.  You notice sudden or extreme puffiness (swelling) of your face, hands, ankles, feet, or legs.  You have not felt the baby move in over an hour.  You have severe headaches that do not go away with medicine.  You have vision changes. This information is not intended to replace advice given to you by your health care provider. Make sure you discuss any questions you have with your health care   provider. Document Released: 05/23/2009 Document Revised: 08/04/2015 Document Reviewed: 04/29/2012 Elsevier Interactive Patient Education  2017 [employer]  

## 2017-03-24 LAB — URINE CULTURE, OB REFLEX

## 2017-03-24 LAB — CULTURE, OB URINE

## 2017-03-25 LAB — OBSTETRIC PANEL, INCLUDING HIV
Antibody Screen: NEGATIVE
Basophils Absolute: 0 10*3/uL (ref 0.0–0.2)
Basos: 0 %
EOS (ABSOLUTE): 0.1 10*3/uL (ref 0.0–0.4)
Eos: 1 %
HEMATOCRIT: 35.1 % (ref 34.0–46.6)
HEP B S AG: NEGATIVE
HIV SCREEN 4TH GENERATION: NONREACTIVE
Hemoglobin: 11.6 g/dL (ref 11.1–15.9)
IMMATURE GRANS (ABS): 0.1 10*3/uL (ref 0.0–0.1)
IMMATURE GRANULOCYTES: 1 %
Lymphocytes Absolute: 2.1 10*3/uL (ref 0.7–3.1)
Lymphs: 18 %
MCH: 28.8 pg (ref 26.6–33.0)
MCHC: 33 g/dL (ref 31.5–35.7)
MCV: 87 fL (ref 79–97)
MONOCYTES: 7 %
Monocytes Absolute: 0.8 10*3/uL (ref 0.1–0.9)
NEUTROS ABS: 8.8 10*3/uL — AB (ref 1.4–7.0)
Neutrophils: 73 %
PLATELETS: 294 10*3/uL (ref 150–379)
RBC: 4.03 x10E6/uL (ref 3.77–5.28)
RDW: 13.9 % (ref 12.3–15.4)
RPR: NONREACTIVE
RUBELLA: 1.55 {index} (ref >0.99)
Rh Factor: POSITIVE
WBC: 12 10*3/uL — ABNORMAL HIGH (ref 3.4–10.8)

## 2017-03-25 LAB — HEMOGLOBINOPATHY EVALUATION
Ferritin: 37 ng/mL (ref 15–150)
HGB A: 97.5 % (ref 96.4–98.8)
HGB C: 0 %
HGB F QUANT: 0 % (ref 0.0–2.0)
HGB S: 0 %
Hgb A2 Quant: 2.5 % (ref 1.8–3.2)
Hgb Solubility: NEGATIVE
Hgb Variant: 0 %

## 2017-03-25 LAB — CYTOLOGY - PAP
CHLAMYDIA, DNA PROBE: NEGATIVE
Diagnosis: NEGATIVE
HPV (WINDOPATH): NOT DETECTED
Neisseria Gonorrhea: NEGATIVE

## 2017-03-28 ENCOUNTER — Encounter: Payer: Self-pay | Admitting: Obstetrics and Gynecology

## 2017-03-28 DIAGNOSIS — O99210 Obesity complicating pregnancy, unspecified trimester: Secondary | ICD-10-CM | POA: Insufficient documentation

## 2017-03-28 DIAGNOSIS — E669 Obesity, unspecified: Secondary | ICD-10-CM | POA: Insufficient documentation

## 2017-04-13 ENCOUNTER — Inpatient Hospital Stay (HOSPITAL_COMMUNITY)
Admission: AD | Admit: 2017-04-13 | Discharge: 2017-04-13 | Disposition: A | Discharge status: Home / Self Care | Payer: Self-pay | Source: Ambulatory Visit | Attending: Obstetrics and Gynecology | Admitting: Obstetrics and Gynecology

## 2017-04-13 ENCOUNTER — Encounter (HOSPITAL_COMMUNITY): Payer: Self-pay

## 2017-04-13 DIAGNOSIS — O26899 Other specified pregnancy related conditions, unspecified trimester: Secondary | ICD-10-CM

## 2017-04-13 DIAGNOSIS — O26892 Other specified pregnancy related conditions, second trimester: Secondary | ICD-10-CM | POA: Insufficient documentation

## 2017-04-13 DIAGNOSIS — N86 Erosion and ectropion of cervix uteri: Secondary | ICD-10-CM | POA: Insufficient documentation

## 2017-04-13 DIAGNOSIS — N898 Other specified noninflammatory disorders of vagina: Secondary | ICD-10-CM | POA: Insufficient documentation

## 2017-04-13 DIAGNOSIS — R102 Pelvic and perineal pain: Secondary | ICD-10-CM

## 2017-04-13 DIAGNOSIS — Z3A17 17 weeks gestation of pregnancy: Secondary | ICD-10-CM | POA: Insufficient documentation

## 2017-04-13 LAB — URINALYSIS, ROUTINE W REFLEX MICROSCOPIC
Bilirubin Urine: NEGATIVE
GLUCOSE, UA: NEGATIVE mg/dL
KETONES UR: NEGATIVE mg/dL
Nitrite: NEGATIVE
PROTEIN: NEGATIVE mg/dL
Specific Gravity, Urine: 1.027 (ref 1.005–1.030)
pH: 5 (ref 5.0–8.0)

## 2017-04-13 LAB — WET PREP, GENITAL
CLUE CELLS WET PREP: NONE SEEN
Sperm: NONE SEEN
Trich, Wet Prep: NONE SEEN
Yeast Wet Prep HPF POC: NONE SEEN

## 2017-04-13 NOTE — Discharge Instructions (Signed)

## 2017-04-13 NOTE — MAU Provider Note (Signed)
History     CSN: [id number]  Arrival date and time: 04/13/17 1606   None     Chief Complaint  Patient presents with  . Abdominal Pain  . Vaginal Discharge  . Back Pain   HPI   [name] is a 37 y.o. female [phone] @[redacted]w[redacted]d  here in MAU with complaints of vaginal discharge, lower back pain and lower abdominal pain.  Today around 0830 she saw yellow/green mucus. Around 3:30 she started having cramping in her lower back; all across her lower back.  Then shortly after she started having minor lower abdominal cramping. No history of preterm deliveries.  No bleeding, No LOF. + fetal movement.  No recent intercourse.   OB History    Gravida Para Term Preterm AB Living   8 3 3  0 4 3   SAB TAB Ectopic Multiple Live Births   3 1 0 0 3      Obstetric Comments   SABs all around 3 months      Past Medical History:  Diagnosis Date  . Anxiety   . Arrhythmia   . Frequent headaches   . Genital warts   . GERD (gastroesophageal reflux disease)   . H/O chlamydia infection   . H/O hematuria   . Heart palpitations   . Nocturnal headaches     Past Surgical History:  Procedure Laterality Date  . DILATION AND CURETTAGE OF UTERUS      Family History  Problem Relation Age of Onset  . Hyperlipidemia Mother   . Hypertension Mother   . Drug abuse Father   . Arthritis Maternal Grandmother   . Hyperlipidemia Maternal Grandmother   . Heart disease Maternal Grandmother   . Hypertension Maternal Grandmother   . Diabetes Maternal Grandmother   . Heart disease Maternal Grandfather   . Hyperlipidemia Sister   . Heart disease Paternal Grandfather   . Other Neg Hx        pco  . Migraines Neg Hx     Social History   Tobacco Use  . Smoking status: Never Smoker  . Smokeless tobacco: Never Used  Substance Use Topics  . Alcohol use: No    Alcohol/week: 0.0 oz    Frequency: Never    Comment: occasional  . Drug use: No    Comment: former (quit 2007)    Allergies: No Known  Allergies  Medications Prior to Admission  Medication Sig Dispense Refill Last Dose  . Prenatal Vit-Fe Fumarate-FA (PRENATAL MULTIVITAMIN) TABS tablet Take 1 tablet by mouth daily at 12 noon.   04/12/2017 at Unknown time  . acetaminophen (TYLENOL) 325 MG tablet Take 3 tablets (975 mg total) by mouth every 6 (six) hours as needed. (Patient not taking: Reported on 03/22/2017)   Not Taking at Unknown time  . cyclobenzaprine (FLEXERIL) 10 MG tablet Take 1 tablet (10 mg total) by mouth 2 (two) times daily as needed for muscle spasms. (Patient not taking: Reported on 03/22/2017) 15 tablet 0 Not Taking at Unknown time  . metroNIDAZOLE (FLAGYL) 500 MG tablet Take 1 tablet (500 mg total) by mouth 2 (two) times daily. (Patient not taking: Reported on 04/13/2017) 14 tablet 0 Completed Course at Unknown time   Results for orders placed or performed during the hospital encounter of 04/13/17 (from the past 48 hour(s))  Urinalysis, Routine w reflex microscopic     Status: Abnormal   Collection Time: 04/13/17  4:11 PM  Result Value Ref Range   Color, Urine YELLOW YELLOW  APPearance HAZY (A) CLEAR   Specific Gravity, Urine 1.027 1.005 - 1.030   pH 5.0 5.0 - 8.0   Glucose, UA NEGATIVE NEGATIVE mg/dL   Hgb urine dipstick LARGE (A) NEGATIVE   Bilirubin Urine NEGATIVE NEGATIVE   Ketones, ur NEGATIVE NEGATIVE mg/dL   Protein, ur NEGATIVE NEGATIVE mg/dL   Nitrite NEGATIVE NEGATIVE   Leukocytes, UA SMALL (A) NEGATIVE   RBC / HPF 6-30 0 - 5 RBC/hpf   WBC, UA 6-30 0 - 5 WBC/hpf   Bacteria, UA RARE (A) NONE SEEN   Squamous Epithelial / LPF 6-30 (A) NONE SEEN   Mucus PRESENT     Comment: Performed at [facility], [address] [place], [place] [address]  Wet prep, genital     Status: Abnormal   Collection Time: 04/13/17  5:13 PM  Result Value Ref Range   Yeast Wet Prep HPF POC NONE SEEN NONE SEEN   Trich, Wet Prep NONE SEEN NONE SEEN   Clue Cells Wet Prep HPF POC NONE SEEN NONE SEEN   WBC, Wet Prep HPF POC  MANY (A) NONE SEEN    Comment: MANY BACTERIA SEEN   Sperm NONE SEEN     Comment: Performed at [facility], [address] [place],  [address]   Review of Systems  Constitutional: Negative for fever.  Gastrointestinal: Positive for abdominal pain.  Genitourinary: Positive for vaginal discharge. Negative for flank pain.  Musculoskeletal: Positive for back pain.   Physical Exam   Blood pressure (!) 122/58, pulse 91, temperature 97.9 F (36.6 C), temperature source Oral, resp. rate 16, height 5\' 3"  (1.6 m), weight 188 lb (85.3 kg), last menstrual period 12/15/2016.  Physical Exam  Constitutional: She is oriented to person, place, and time. She appears well-developed and well-nourished. No distress.  HENT:  Head: Normocephalic.  Eyes: Pupils are equal, round, and reactive to light.  GI: Soft. She exhibits no distension. There is no tenderness. There is no rebound.  Genitourinary:  Genitourinary Comments: Vagina - Small amount of pale yellow, vaginal discharge, no odor  Cervix - + contact bleeding, no active bleeding, ectropion noted.  Bimanual exam: Cervix closed GC/Chlam, wet prep done Chaperone present for exam.   Musculoskeletal: Normal range of motion.  Neurological: She is alert and oriented to person, place, and time.  Skin: Skin is warm. She is not diaphoretic.  Psychiatric: Her behavior is normal.   MAU Course  Procedures  None  MDM  Wet prep and GC Urine Urine culture: patient asymptomatic.   Assessment and Plan   A:  1. Vaginal discharge during pregnancy in second trimester   2. Pain of round ligament affecting pregnancy, antepartum   3. Ectropion of cervix     P:  Discharge home in stable condition, with strict return precautions Pregnancy support belt recommended  GC pending  Lezlie Lye, NP 04/13/2017 6:29 PM

## 2017-04-13 NOTE — MAU Note (Signed)
Patient presents with c/o yellow discharge this morning and back pain, now having lower abdominal cramping.

## 2017-04-15 LAB — GC/CHLAMYDIA PROBE AMP (~~LOC~~) NOT AT ARMC
Chlamydia: NEGATIVE
NEISSERIA GONORRHEA: NEGATIVE

## 2017-04-16 LAB — CULTURE, OB URINE
Culture: 10000 — AB
Special Requests: NORMAL

## 2017-04-19 ENCOUNTER — Ambulatory Visit (INDEPENDENT_AMBULATORY_CARE_PROVIDER_SITE_OTHER): Payer: Self-pay | Admitting: Advanced Practice Midwife

## 2017-04-19 VITALS — BP 115/60 | HR 75 | Wt 191.6 lb

## 2017-04-19 DIAGNOSIS — Z3490 Encounter for supervision of normal pregnancy, unspecified, unspecified trimester: Secondary | ICD-10-CM

## 2017-04-19 DIAGNOSIS — O0990 Supervision of high risk pregnancy, unspecified, unspecified trimester: Secondary | ICD-10-CM

## 2017-04-19 DIAGNOSIS — O09522 Supervision of elderly multigravida, second trimester: Secondary | ICD-10-CM

## 2017-04-19 DIAGNOSIS — O0992 Supervision of high risk pregnancy, unspecified, second trimester: Secondary | ICD-10-CM

## 2017-04-19 NOTE — Progress Notes (Signed)
   PRENATAL VISIT NOTE  Subjective:  Cassandra [name] is a 37 y.o. [phone] at [redacted]w[redacted]d being seen today for ongoing prenatal care.  She is currently monitored for the following issues for this low-risk pregnancy and has Heart palpitations; Hypertriglyceridemia; Loss of weight; Constipation; IBS (irritable bowel syndrome); Supervision of high risk pregnancy, antepartum; AMA (advanced maternal age) multigravida 35+; Obesity (BMI 30-39.9); and Obesity in pregnancy on their problem list.  Patient reports no complaints.  Contractions: Not present. Vag. Bleeding: None.  Movement: Present. Denies leaking of fluid.   The following portions of the patient's history were reviewed and updated as appropriate: allergies, current medications, past family history, past medical history, past social history, past surgical history and problem list. Problem list updated.  Objective:   Vitals:   04/19/17 1608  BP: 115/60  Pulse: 75  Weight: 191 lb 9.6 oz (86.9 kg)    Fetal Status: Fetal Heart Rate (bpm): 159   Movement: Present     General:  Alert, oriented and cooperative. Patient is in no acute distress.  Skin: Skin is warm and dry. No rash noted.   Cardiovascular: Normal heart rate noted  Respiratory: Normal respiratory effort, no problems with respiration noted  Abdomen: Soft, gravid, appropriate for gestational age.  Pain/Pressure: Absent     Pelvic: Cervical exam deferred        Extremities: Normal range of motion.  Edema: None  Mental Status:  Normal mood and affect. Normal behavior. Normal judgment and thought content.   Assessment and Plan:  Pregnancy: [id number] at [redacted]w[redacted]d  1. Supervision of low risk pregnancy, antepartum  --Anticipatory guidance about next weeks of pregnancy and next visits --Questions answered  2. Advanced Maternal Age --Pt desires genetic testing, is self-pay so discussed screening vs NIPS.   - [place] MFM OB DETAIL +14 WK; Future - SMN1 COPY NUMBER ANALYSIS (SMA Carrier Screen) -  AFP TETRA  Preterm labor symptoms and general obstetric precautions including but not limited to vaginal bleeding, contractions, leaking of fluid and fetal movement were reviewed in detail with the patient. Please refer to After Visit Summary for other counseling recommendations.  No Follow-up on file.   Fatima Blank, CNM

## 2017-04-19 NOTE — Patient Instructions (Signed)

## 2017-04-23 ENCOUNTER — Encounter (HOSPITAL_COMMUNITY): Payer: Self-pay | Admitting: Advanced Practice Midwife

## 2017-04-24 ENCOUNTER — Encounter (HOSPITAL_COMMUNITY): Payer: Self-pay

## 2017-04-26 LAB — AFP TETRA
DIA Mom Value: 1.26
DIA VALUE (EIA): 183.53 pg/mL
DSR (BY AGE) 1 IN: 159
DSR (SECOND TRIMESTER) 1 IN: 3580
Gestational Age: 17 WEEKS
MATERNAL AGE AT EDD: 37.7 a
MSAFP Mom: 2.16
MSAFP: 68.2 ng/mL
MSHCG MOM: 1.15
MSHCG: 31119 m[IU]/mL
OSB RISK: 569
T18 (By Age): 1:618 {titer}
TEST RESULTS AFP: NEGATIVE
Weight: 191 [lb_av]
uE3 Mom: 1.37
uE3 Value: 1.34 ng/mL

## 2017-04-26 LAB — SMN1 COPY NUMBER ANALYSIS (SMA CARRIER SCREENING)

## 2017-04-29 ENCOUNTER — Encounter (HOSPITAL_COMMUNITY): Payer: Self-pay

## 2017-04-29 ENCOUNTER — Other Ambulatory Visit (HOSPITAL_COMMUNITY): Payer: Self-pay | Admitting: *Deleted

## 2017-04-29 ENCOUNTER — Ambulatory Visit (HOSPITAL_COMMUNITY)
Admission: RE | Admit: 2017-04-29 | Discharge: 2017-04-29 | Disposition: A | Discharge status: Home / Self Care | Payer: Self-pay | Source: Ambulatory Visit | Attending: Advanced Practice Midwife | Admitting: Advanced Practice Midwife

## 2017-04-29 ENCOUNTER — Other Ambulatory Visit: Payer: Self-pay | Admitting: Advanced Practice Midwife

## 2017-04-29 DIAGNOSIS — O09522 Supervision of elderly multigravida, second trimester: Secondary | ICD-10-CM

## 2017-04-29 DIAGNOSIS — G40909 Epilepsy, unspecified, not intractable, without status epilepticus: Secondary | ICD-10-CM

## 2017-04-29 DIAGNOSIS — Z3A19 19 weeks gestation of pregnancy: Secondary | ICD-10-CM

## 2017-04-29 DIAGNOSIS — Z3689 Encounter for other specified antenatal screening: Secondary | ICD-10-CM

## 2017-04-29 DIAGNOSIS — O0990 Supervision of high risk pregnancy, unspecified, unspecified trimester: Secondary | ICD-10-CM

## 2017-04-29 DIAGNOSIS — O09529 Supervision of elderly multigravida, unspecified trimester: Secondary | ICD-10-CM

## 2017-04-29 DIAGNOSIS — O99212 Obesity complicating pregnancy, second trimester: Secondary | ICD-10-CM | POA: Insufficient documentation

## 2017-04-29 NOTE — Addendum Note (Signed)
Encounter addended by: Berlinda Last, RTR on: 04/29/2017 10:14 AM  Actions taken: Imaging Exam ended

## 2017-05-17 ENCOUNTER — Encounter: Payer: Self-pay | Admitting: Medical

## 2017-05-17 ENCOUNTER — Ambulatory Visit (INDEPENDENT_AMBULATORY_CARE_PROVIDER_SITE_OTHER): Payer: Self-pay | Admitting: Medical

## 2017-05-17 VITALS — BP 116/54 | HR 70 | Wt 195.0 lb

## 2017-05-17 DIAGNOSIS — O09522 Supervision of elderly multigravida, second trimester: Secondary | ICD-10-CM

## 2017-05-17 DIAGNOSIS — O99210 Obesity complicating pregnancy, unspecified trimester: Secondary | ICD-10-CM

## 2017-05-17 DIAGNOSIS — Z3490 Encounter for supervision of normal pregnancy, unspecified, unspecified trimester: Secondary | ICD-10-CM

## 2017-05-17 NOTE — Patient Instructions (Signed)
Second Trimester of Pregnancy The second trimester is from week 13 through week 28, month 4 through 6. This is often the time in pregnancy that you feel your best. Often times, morning sickness has lessened or quit. You may have more energy, and you may get hungry more often. Your unborn baby (fetus) is growing rapidly. At the end of the sixth month, he or she is about 9 inches long and weighs about 1 pounds. You will likely feel the baby move (quickening) between 18 and 20 weeks of pregnancy. Follow these instructions at home:  Avoid all smoking, herbs, and alcohol. Avoid drugs not approved by your doctor.  Do not use any tobacco products, including cigarettes, chewing tobacco, and electronic cigarettes. If you need help quitting, ask your doctor. You may get counseling or other support to help you quit.  Only take medicine as told by your doctor. Some medicines are safe and some are not during pregnancy.  Exercise only as told by your doctor. Stop exercising if you start having cramps.  Eat regular, healthy meals.  Wear a good support bra if your breasts are tender.  Do not use hot tubs, steam rooms, or saunas.  Wear your seat belt when driving.  Avoid raw meat, uncooked cheese, and liter boxes and soil used by cats.  Take your prenatal vitamins.  Take 1500-2000 milligrams of calcium daily starting at the 20th week of pregnancy until you deliver your baby.  Try taking medicine that helps you poop (stool softener) as needed, and if your doctor approves. Eat more fiber by eating fresh fruit, vegetables, and whole grains. Drink enough fluids to keep your pee (urine) clear or pale yellow.  Take warm water baths (sitz baths) to soothe pain or discomfort caused by hemorrhoids. Use hemorrhoid cream if your doctor approves.  If you have puffy, bulging veins (varicose veins), wear support hose. Raise (elevate) your feet for 15 minutes, 3-4 times a day. Limit salt in your diet.  Avoid heavy  lifting, wear low heals, and sit up straight.  Rest with your legs raised if you have leg cramps or low back pain.  Visit your dentist if you have not gone during your pregnancy. Use a soft toothbrush to brush your teeth. Be gentle when you floss.  You can have sex (intercourse) unless your doctor tells you not to.  Go to your doctor visits. Get help if:  You feel dizzy.  You have mild cramps or pressure in your lower belly (abdomen).  You have a nagging pain in your belly area.  You continue to feel sick to your stomach (nauseous), throw up (vomit), or have watery poop (diarrhea).  You have bad smelling fluid coming from your vagina.  You have pain with peeing (urination). Get help right away if:  You have a fever.  You are leaking fluid from your vagina.  You have spotting or bleeding from your vagina.  You have severe belly cramping or pain.  You lose or gain weight rapidly.  You have trouble catching your breath and have chest pain.  You notice sudden or extreme puffiness (swelling) of your face, hands, ankles, feet, or legs.  You have not felt the baby move in over an hour.  You have severe headaches that do not go away with medicine.  You have vision changes. This information is not intended to replace advice given to you by your health care provider. Make sure you discuss any questions you have with your health care   provider. Document Released: 05/23/2009 Document Revised: 08/04/2015 Document Reviewed: 04/29/2012 Elsevier Interactive Patient Education  2017 [employer]  

## 2017-05-17 NOTE — Progress Notes (Signed)
   PRENATAL VISIT NOTE  Subjective:  Cassandra [name] is a 37 y.o. [id number] at [redacted]w[redacted]d being seen today for ongoing prenatal care.  She is currently monitored for the following issues for this high-risk pregnancy and has Heart palpitations; Hypertriglyceridemia; Loss of weight; Constipation; IBS (irritable bowel syndrome); Supervision of low-risk pregnancy; AMA (advanced maternal age) multigravida 35+; Obesity (BMI 30-39.9); and Obesity in pregnancy on their problem list.  Patient reports no complaints.  Contractions: Not present. Vag. Bleeding: None.  Movement: Present. Denies leaking of fluid.   The following portions of the patient's history were reviewed and updated as appropriate: allergies, current medications, past family history, past medical history, past social history, past surgical history and problem list. Problem list updated.  Objective:   Vitals:   05/17/17 1607  BP: (!) 116/54  Pulse: 70  Weight: 195 lb (88.5 kg)    Fetal Status: Fetal Heart Rate (bpm): 153 Fundal Height: 22 cm Movement: Present     General:  Alert, oriented and cooperative. Patient is in no acute distress.  Skin: Skin is warm and dry. No rash noted.   Cardiovascular: Normal heart rate noted  Respiratory: Normal respiratory effort, no problems with respiration noted  Abdomen: Soft, gravid, appropriate for gestational age.  Pain/Pressure: Absent     Pelvic: Cervical exam deferred        Extremities: Normal range of motion.  Edema: None  Mental Status:  Normal mood and affect. Normal behavior. Normal judgment and thought content.   Assessment and Plan:  Pregnancy: [id number] at [redacted]w[redacted]d  1. Encounter for supervision of low-risk pregnancy, antepartum  2. Obesity in pregnancy - Discussed healthy diet and exercise during pregnancy - Patient states that she just "carries heavy" and always gains too much weight with her pregnancies  3. Elderly multigravida in second trimester - Quad screen normal - Anatomy US  normal and reviewed with patient, incomplete and will reassess on 4/1 to complete anatomy   Preterm labor/second trimester warning symptoms and general obstetric precautions including but not limited to vaginal bleeding, contractions, leaking of fluid and fetal movement were reviewed in detail with the patient. Please refer to After Visit Summary for other counseling recommendations.  Return in about 4 weeks (around 06/14/2017) for LOB.   Kerry Hough, PA-C

## 2017-05-28 ENCOUNTER — Telehealth: Payer: Self-pay | Admitting: *Deleted

## 2017-05-28 NOTE — Telephone Encounter (Signed)
Geoffrey called 05/23/17 2:54 pm and left a voice message stating she has an appointment tomorrow 05/24/17 for an iterio scan and wants to know if it is safe.

## 2017-06-02 ENCOUNTER — Ambulatory Visit: Payer: Self-pay | Admitting: Family

## 2017-06-02 VITALS — BP 115/65 | HR 81 | Temp 98.1°F | Resp 16 | Wt 197.8 lb

## 2017-06-02 DIAGNOSIS — R300 Dysuria: Secondary | ICD-10-CM

## 2017-06-02 DIAGNOSIS — A499 Bacterial infection, unspecified: Secondary | ICD-10-CM

## 2017-06-02 DIAGNOSIS — N390 Urinary tract infection, site not specified: Secondary | ICD-10-CM

## 2017-06-02 DIAGNOSIS — Z3402 Encounter for supervision of normal first pregnancy, second trimester: Secondary | ICD-10-CM

## 2017-06-02 LAB — POCT URINALYSIS DIPSTICK
Glucose, UA: NEGATIVE
Ketones, UA: NEGATIVE
Nitrite, UA: NEGATIVE
PH UA: 5 (ref 5.0–8.0)
Protein, UA: NEGATIVE
Spec Grav, UA: 1.025 (ref 1.010–1.025)
UROBILINOGEN UA: 0.2 U/dL

## 2017-06-02 MED ORDER — CEPHALEXIN 500 MG PO CAPS: 500.0000 mg | ORAL_CAPSULE | Freq: Four times a day (QID) | ORAL | 0 refills | Status: DC

## 2017-06-02 NOTE — Patient Instructions (Signed)

## 2017-06-02 NOTE — Progress Notes (Signed)
Subjective:     Patient ID: Cassandra [name], female   DOB: Aug 28, 1980, 37 y.o.   MRN: [id number]  HPI 37 year old female is in today with c/o urinary frequency, pressure, burning x 3 days. She os 6.5 months gestation. No fever. Baby active and moving  Review of Systems  Constitutional: Negative.   Respiratory: Negative.   Cardiovascular: Negative.   Genitourinary: Positive for dysuria, frequency and urgency.  Musculoskeletal: Negative.   Neurological: Negative.    Past Medical History:  Diagnosis Date  . Anxiety   . Arrhythmia   . Frequent headaches   . Genital warts   . GERD (gastroesophageal reflux disease)   . H/O chlamydia infection   . H/O hematuria   . Heart palpitations   . Nocturnal headaches     Social History   Socioeconomic History  . Marital status: Married    Spouse name: Jamel  . Number of children: 3  . Years of education: 80  . Highest education level: Not on file  Occupational History  . Not on file  Social Needs  . Financial resource strain: Not on file  . Food insecurity:    Worry: Not on file    Inability: Not on file  . Transportation needs:    Medical: Not on file    Non-medical: Not on file  Tobacco Use  . Smoking status: Never Smoker  . Smokeless tobacco: Never Used  Substance and Sexual Activity  . Alcohol use: No    Alcohol/week: 0.0 oz    Frequency: Never    Comment: occasional  . Drug use: No    Comment: former (quit 2007)  . Sexual activity: Yes    Partners: Male    Birth control/protection: Condom, None  Lifestyle  . Physical activity:    Days per week: Not on file    Minutes per session: Not on file  . Stress: Not on file  Relationships  . Social connections:    Talks on phone: Not on file    Gets together: Not on file    Attends religious service: Not on file    Active member of club or organization: Not on file    Attends meetings of clubs or organizations: Not on file    Relationship status: Not on file  . Intimate  partner violence:    Fear of current or ex partner: Not on file    Emotionally abused: Not on file    Physically abused: Not on file    Forced sexual activity: Not on file  Other Topics Concern  . Not on file  Social History Narrative   Epworth Sleepiness Scale = 2 (01/13/2015)Lives with husband and 3 children   Caffeine use: 1-2 coffee per week.     Past Surgical History:  Procedure Laterality Date  . DILATION AND CURETTAGE OF UTERUS      Family History  Problem Relation Age of Onset  . Hyperlipidemia Mother   . Hypertension Mother   . Drug abuse Father   . Arthritis Maternal Grandmother   . Hyperlipidemia Maternal Grandmother   . Heart disease Maternal Grandmother   . Hypertension Maternal Grandmother   . Diabetes Maternal Grandmother   . Heart disease Maternal Grandfather   . Hyperlipidemia Sister   . Heart disease Paternal Grandfather   . Other Neg Hx        pco  . Migraines Neg Hx     No Known Allergies  Current Outpatient Medications on File  Prior to Visit  Medication Sig Dispense Refill  . Prenatal Vit-Fe Fumarate-FA (PRENATAL MULTIVITAMIN) TABS tablet Take 1 tablet by mouth daily at 12 noon.     No current facility-administered medications on file prior to visit.     BP 115/65 (BP Location: Right Arm, Patient Position: Sitting, Cuff Size: Normal)   Pulse 81   Temp 98.1 F (36.7 C) (Oral)   Resp 16   Wt 197 lb 12.8 oz (89.7 kg)   LMP 12/15/2016 Comment: ? SAB in Sept, +PT in Oct  SpO2 98%   BMI 35.04 kg/m chart    Objective:   Physical Exam  Constitutional: She is oriented to person, place, and time. She appears well-developed and well-nourished.  Cardiovascular: Normal rate, regular rhythm and normal heart sounds.  Pulmonary/Chest: Effort normal and breath sounds normal.  Abdominal: Soft. Bowel sounds are normal.  Musculoskeletal: Normal range of motion.  Neurological: She is alert and oriented to person, place, and time.  Skin: Skin is warm and  dry.  Psychiatric: She has a normal mood and affect.       Assessment:     Cassandra [name] was seen today for back pain and dysuria.  Diagnoses and all orders for this visit:  Dysuria -     POCT urinalysis dipstick  Urinary tract bacterial infections  Encounter for supervision of low-risk first pregnancy in second trimester  Other orders -     cephALEXin (KEFLEX) 500 MG capsule; Take 1 capsule (500 mg total) by mouth 4 (four) times daily.      Plan:     Call the office with any questions or concerns.

## 2017-06-10 ENCOUNTER — Ambulatory Visit (HOSPITAL_COMMUNITY)
Admission: RE | Admit: 2017-06-10 | Discharge: 2017-06-10 | Disposition: A | Discharge status: Home / Self Care | Payer: Self-pay | Source: Ambulatory Visit | Attending: Advanced Practice Midwife | Admitting: Advanced Practice Midwife

## 2017-06-10 ENCOUNTER — Other Ambulatory Visit (HOSPITAL_COMMUNITY): Payer: Self-pay | Admitting: Obstetrics and Gynecology

## 2017-06-10 ENCOUNTER — Encounter (HOSPITAL_COMMUNITY): Payer: Self-pay

## 2017-06-10 ENCOUNTER — Other Ambulatory Visit (HOSPITAL_COMMUNITY): Payer: Self-pay | Admitting: *Deleted

## 2017-06-10 DIAGNOSIS — Z3A25 25 weeks gestation of pregnancy: Secondary | ICD-10-CM | POA: Insufficient documentation

## 2017-06-10 DIAGNOSIS — O09529 Supervision of elderly multigravida, unspecified trimester: Secondary | ICD-10-CM

## 2017-06-10 DIAGNOSIS — O99212 Obesity complicating pregnancy, second trimester: Secondary | ICD-10-CM | POA: Insufficient documentation

## 2017-06-10 DIAGNOSIS — Z3689 Encounter for other specified antenatal screening: Secondary | ICD-10-CM | POA: Insufficient documentation

## 2017-06-10 DIAGNOSIS — O09522 Supervision of elderly multigravida, second trimester: Secondary | ICD-10-CM | POA: Insufficient documentation

## 2017-06-10 DIAGNOSIS — O43103 Malformation of placenta, unspecified, third trimester: Secondary | ICD-10-CM

## 2017-06-10 NOTE — Addendum Note (Signed)
Encounter addended by: Novella Rob, RDMS on: 06/10/2017 9:58 AM  Actions taken: Imaging Exam ended

## 2017-06-10 NOTE — Telephone Encounter (Signed)
I called Cassandra [name] and left a message I am returning your call - if you still have a question- please call us back.  Also will see you at next ob appointment 06/14/17.

## 2017-06-12 ENCOUNTER — Ambulatory Visit (INDEPENDENT_AMBULATORY_CARE_PROVIDER_SITE_OTHER): Payer: Self-pay | Admitting: Obstetrics & Gynecology

## 2017-06-12 VITALS — BP 119/64 | HR 94 | Wt 198.2 lb

## 2017-06-12 DIAGNOSIS — O09522 Supervision of elderly multigravida, second trimester: Secondary | ICD-10-CM

## 2017-06-12 DIAGNOSIS — Z3490 Encounter for supervision of normal pregnancy, unspecified, unspecified trimester: Secondary | ICD-10-CM

## 2017-06-12 DIAGNOSIS — O99210 Obesity complicating pregnancy, unspecified trimester: Secondary | ICD-10-CM

## 2017-06-12 NOTE — Progress Notes (Signed)
   PRENATAL VISIT NOTE  Subjective:  Cassandra [name] is a 37 y.o. [phone] at [redacted]w[redacted]d being seen today for ongoing prenatal care.  She is currently monitored for the following issues for this low-risk pregnancy and has Heart palpitations; Hypertriglyceridemia; Loss of weight; Constipation; IBS (irritable bowel syndrome); Supervision of low-risk pregnancy; AMA (advanced maternal age) multigravida 35+; Obesity (BMI 30-39.9); and Obesity in pregnancy on their problem list.  Patient reports cramping and back pain.  Contractions: Not present. Vag. Bleeding: None.  Movement: Present. Denies leaking of fluid.   The following portions of the patient's history were reviewed and updated as appropriate: allergies, current medications, past family history, past medical history, past social history, past surgical history and problem list. Problem list updated.  Objective:   Vitals:   06/12/17 1414  BP: 119/64  Pulse: 94  Weight: 198 lb 3.2 oz (89.9 kg)    Fetal Status: Fetal Heart Rate (bpm): 147   Movement: Present     General:  Alert, oriented and cooperative. Patient is in no acute distress.  Skin: Skin is warm and dry. No rash noted.   Cardiovascular: Normal heart rate noted  Respiratory: Normal respiratory effort, no problems with respiration noted  Abdomen: Soft, gravid, appropriate for gestational age.  Pain/Pressure: Present     Pelvic: Cervical exam performed        Extremities: Normal range of motion.  Edema: None  Mental Status: Normal mood and affect. Normal behavior. Normal judgment and thought content.   Assessment and Plan:  Pregnancy: [id number] at [redacted]w[redacted]d  1. Multigravida of advanced maternal age in second trimester - 2 hour GTT at next visit - follow up u/s on 07-08-17  2. Encounter for supervision of low-risk pregnancy, antepartum   3. Obesity in pregnancy   Preterm labor symptoms and general obstetric precautions including but not limited to vaginal bleeding, contractions,  leaking of fluid and fetal movement were reviewed in detail with the patient. Please refer to After Visit Summary for other counseling recommendations.  Return in about 2 weeks (around 06/26/2017) for 2 hour GTT.  Future Appointments  Date Time Provider [place]  07/08/2017 10:00 AM [facility]FC [place] 3 [facility]-MFCUS [facility]-US    Emily Filbert, MD

## 2017-06-12 NOTE — Progress Notes (Signed)
Pt concerned about increasing back pain and cramping. Also wants to discuss u/s results.

## 2017-06-14 ENCOUNTER — Encounter: Payer: Self-pay | Admitting: Student

## 2017-06-25 ENCOUNTER — Inpatient Hospital Stay (HOSPITAL_COMMUNITY)
Admission: AD | Admit: 2017-06-25 | Discharge: 2017-06-25 | Disposition: A | Discharge status: Home / Self Care | Payer: Self-pay | Source: Ambulatory Visit | Attending: Family Medicine | Admitting: Family Medicine

## 2017-06-25 ENCOUNTER — Encounter (HOSPITAL_COMMUNITY): Payer: Self-pay | Admitting: *Deleted

## 2017-06-25 ENCOUNTER — Other Ambulatory Visit: Payer: Self-pay

## 2017-06-25 DIAGNOSIS — O99612 Diseases of the digestive system complicating pregnancy, second trimester: Secondary | ICD-10-CM | POA: Insufficient documentation

## 2017-06-25 DIAGNOSIS — O09522 Supervision of elderly multigravida, second trimester: Secondary | ICD-10-CM | POA: Insufficient documentation

## 2017-06-25 DIAGNOSIS — Z3A27 27 weeks gestation of pregnancy: Secondary | ICD-10-CM | POA: Insufficient documentation

## 2017-06-25 DIAGNOSIS — O99342 Other mental disorders complicating pregnancy, second trimester: Secondary | ICD-10-CM | POA: Insufficient documentation

## 2017-06-25 DIAGNOSIS — F419 Anxiety disorder, unspecified: Secondary | ICD-10-CM | POA: Insufficient documentation

## 2017-06-25 DIAGNOSIS — O26892 Other specified pregnancy related conditions, second trimester: Secondary | ICD-10-CM | POA: Insufficient documentation

## 2017-06-25 DIAGNOSIS — O99891 Other specified diseases and conditions complicating pregnancy: Secondary | ICD-10-CM

## 2017-06-25 DIAGNOSIS — M549 Dorsalgia, unspecified: Secondary | ICD-10-CM | POA: Insufficient documentation

## 2017-06-25 DIAGNOSIS — Z3490 Encounter for supervision of normal pregnancy, unspecified, unspecified trimester: Secondary | ICD-10-CM

## 2017-06-25 DIAGNOSIS — O9989 Other specified diseases and conditions complicating pregnancy, childbirth and the puerperium: Secondary | ICD-10-CM

## 2017-06-25 DIAGNOSIS — D1809 Hemangioma of other sites: Secondary | ICD-10-CM | POA: Insufficient documentation

## 2017-06-25 DIAGNOSIS — O09892 Supervision of other high risk pregnancies, second trimester: Secondary | ICD-10-CM | POA: Insufficient documentation

## 2017-06-25 DIAGNOSIS — K219 Gastro-esophageal reflux disease without esophagitis: Secondary | ICD-10-CM | POA: Insufficient documentation

## 2017-06-25 DIAGNOSIS — R300 Dysuria: Secondary | ICD-10-CM | POA: Insufficient documentation

## 2017-06-25 LAB — URINALYSIS, ROUTINE W REFLEX MICROSCOPIC
BILIRUBIN URINE: NEGATIVE
GLUCOSE, UA: NEGATIVE mg/dL
Hgb urine dipstick: NEGATIVE
KETONES UR: 5 mg/dL — AB
NITRITE: NEGATIVE
PH: 6 (ref 5.0–8.0)
Protein, ur: 30 mg/dL — AB
SPECIFIC GRAVITY, URINE: 1.029 (ref 1.005–1.030)

## 2017-06-25 NOTE — Discharge Instructions (Signed)

## 2017-06-25 NOTE — MAU Note (Signed)
Thinks she might have another UTI.  Frequency, urgency, some cramping and back pain- started 2-3 days ago.  Bump on lower lip- popped up about 3 wks ago, never had anything like that before, did not mention at prenatal visit.  States it seems to be getting bigger.

## 2017-06-25 NOTE — MAU Provider Note (Signed)
History     CSN: [id number]  Arrival date and time: 06/25/17 1508   First Provider Initiated Contact with Patient 06/25/17 1628      Chief Complaint  Patient presents with  . Dysuria  . Mouth Lesions   Cassandra [name] is a 37 y.o. [id number] at [redacted]w[redacted]d who presents today with dysuria and backache x 3 days. She reports that she likes to soak in the bathtub, but has noticed that every time she does this she gets a UTI. She was using the bathtub prior to symptom onset. She denies any LOF, VB or contractions. She reports normal fetal movement.   She is also here with a bump on her lip x 3 weeks. She reports that it started suddenly. She reports that it is getting bigger.   Dysuria   This is a new problem. The current episode started in the past 7 days. The problem occurs intermittently. The problem has been unchanged. The pain is moderate. There has been no fever. She is sexually active. There is no history of pyelonephritis. Pertinent negatives include no chills, frequency, nausea, urgency or vomiting. She has tried nothing for the symptoms.  Mouth Lesions   The current episode started more than 1 week ago. The onset was sudden. Episode frequency: never had anything like this before.  The problem has been gradually worsening. Nothing relieves the symptoms. Nothing aggravates the symptoms. Associated symptoms include abdominal pain and mouth sores. Pertinent negatives include no fever, no nausea and no vomiting.      Past Medical History:  Diagnosis Date  . Anxiety   . Arrhythmia   . Frequent headaches   . Genital warts   . GERD (gastroesophageal reflux disease)   . H/O chlamydia infection   . H/O hematuria   . Heart palpitations   . Nocturnal headaches     Past Surgical History:  Procedure Laterality Date  . DILATION AND CURETTAGE OF UTERUS      Family History  Problem Relation Age of Onset  . Hyperlipidemia Mother   . Hypertension Mother   . Drug abuse Father   . Arthritis  Maternal Grandmother   . Hyperlipidemia Maternal Grandmother   . Heart disease Maternal Grandmother   . Hypertension Maternal Grandmother   . Diabetes Maternal Grandmother   . Heart disease Maternal Grandfather   . Hyperlipidemia Sister   . Heart disease Paternal Grandfather   . Other Neg Hx        pco  . Migraines Neg Hx     Social History   Tobacco Use  . Smoking status: Never Smoker  . Smokeless tobacco: Never Used  Substance Use Topics  . Alcohol use: No    Alcohol/week: 0.0 oz    Frequency: Never    Comment: occasional  . Drug use: No    Comment: former (quit 2007)    Allergies: No Known Allergies  Medications Prior to Admission  Medication Sig Dispense Refill Last Dose  . Prenatal Vit-Fe Fumarate-FA (PRENATAL MULTIVITAMIN) TABS tablet Take 1 tablet by mouth daily at 12 noon.   06/24/2017 at Unknown time  . cephALEXin (KEFLEX) 500 MG capsule Take 1 capsule (500 mg total) by mouth 4 (four) times daily. (Patient not taking: Reported on 06/25/2017) 28 capsule 0 Not Taking at Unknown time    Review of Systems  Constitutional: Negative for chills and fever.  HENT: Positive for mouth sores.   Gastrointestinal: Positive for abdominal pain. Negative for nausea and vomiting.  Genitourinary: Positive  for dysuria. Negative for frequency, urgency, vaginal bleeding and vaginal discharge.  Musculoskeletal: Positive for back pain.   Physical Exam   Blood pressure 120/63, pulse 93, temperature 98.7 F (37.1 C), temperature source Oral, resp. rate 18, weight 199 lb 12 oz (90.6 kg), last menstrual period 12/15/2016, SpO2 99 %.  Physical Exam  Nursing note and vitals reviewed. Constitutional: She is oriented to person, place, and time. She appears well-developed and well-nourished. No distress.  HENT:  Head: Normocephalic.  Cardiovascular: Normal rate.  Respiratory: Effort normal.  GI: Soft. There is no tenderness. There is no rebound.  Genitourinary:  Genitourinary Comments:    No CVA tenderness   Neurological: She is alert and oriented to person, place, and time.  Skin: Skin is warm and dry.  See picture of possible hemangioma of the lip.   Psychiatric: She has a normal mood and affect.        FHT: 150, moderate with 15x15 accels, no decels Toco: no UCs   Results for orders placed or performed during the hospital encounter of 06/25/17 (from the past 24 hour(s))  Urinalysis, Routine w reflex microscopic     Status: Abnormal   Collection Time: 06/25/17  3:36 PM  Result Value Ref Range   Color, Urine AMBER (A) YELLOW   APPearance HAZY (A) CLEAR   Specific Gravity, Urine 1.029 1.005 - 1.030   pH 6.0 5.0 - 8.0   Glucose, UA NEGATIVE NEGATIVE mg/dL   Hgb urine dipstick NEGATIVE NEGATIVE   Bilirubin Urine NEGATIVE NEGATIVE   Ketones, ur 5 (A) NEGATIVE mg/dL   Protein, ur 30 (A) NEGATIVE mg/dL   Nitrite NEGATIVE NEGATIVE   Leukocytes, UA SMALL (A) NEGATIVE   RBC / HPF 6-30 0 - 5 RBC/hpf   WBC, UA 0-5 0 - 5 WBC/hpf   Bacteria, UA RARE (A) NONE SEEN   Squamous Epithelial / LPF 6-30 (A) NONE SEEN   Mucus PRESENT    MAU Course  Procedures  MDM Message sent to clinical pool to send referral to derm.   Assessment and Plan   1. Back pain in pregnancy   2. Multigravida of advanced maternal age in second trimester   3. Encounter for supervision of low-risk pregnancy, antepartum   4. Hemangioma of other sites   5. [redacted] weeks gestation of pregnancy    DC home Message sent to clinical pool for derm referral Urine culture sent Comfort measures reviewed  3rd Trimester precautions  PTL precautions  Fetal kick counts RX: none  Return to MAU as needed FU with OB as planned  [facility] for [place] Follow up.   Specialty:  Obstetrics and Gynecology Contact information: [place] 06/25/2017, 4:35 PM

## 2017-06-26 LAB — CULTURE, OB URINE

## 2017-07-01 ENCOUNTER — Other Ambulatory Visit: Payer: Self-pay

## 2017-07-01 ENCOUNTER — Other Ambulatory Visit: Payer: Self-pay | Admitting: *Deleted

## 2017-07-01 DIAGNOSIS — O0993 Supervision of high risk pregnancy, unspecified, third trimester: Secondary | ICD-10-CM

## 2017-07-01 DIAGNOSIS — D1801 Hemangioma of skin and subcutaneous tissue: Secondary | ICD-10-CM

## 2017-07-02 LAB — CBC
HEMATOCRIT: 35.2 % (ref 34.0–46.6)
HEMOGLOBIN: 11.5 g/dL (ref 11.1–15.9)
MCH: 29.4 pg (ref 26.6–33.0)
MCHC: 32.7 g/dL (ref 31.5–35.7)
MCV: 90 fL (ref 79–97)
Platelets: 273 10*3/uL (ref 150–379)
RBC: 3.91 x10E6/uL (ref 3.77–5.28)
RDW: 13.9 % (ref 12.3–15.4)
WBC: 10.2 10*3/uL (ref 3.4–10.8)

## 2017-07-02 LAB — GLUCOSE TOLERANCE, 2 HOURS W/ 1HR
GLUCOSE, 1 HOUR: 147 mg/dL (ref 65–179)
Glucose, 2 hour: 97 mg/dL (ref 65–152)
Glucose, Fasting: 86 mg/dL (ref 65–91)

## 2017-07-02 LAB — HIV ANTIBODY (ROUTINE TESTING W REFLEX): HIV Screen 4th Generation wRfx: NONREACTIVE

## 2017-07-02 LAB — RPR: RPR: NONREACTIVE

## 2017-07-04 ENCOUNTER — Other Ambulatory Visit: Payer: Self-pay | Admitting: Advanced Practice Midwife

## 2017-07-04 DIAGNOSIS — Z3490 Encounter for supervision of normal pregnancy, unspecified, unspecified trimester: Secondary | ICD-10-CM

## 2017-07-08 ENCOUNTER — Encounter (HOSPITAL_COMMUNITY): Payer: Self-pay

## 2017-07-08 ENCOUNTER — Ambulatory Visit (INDEPENDENT_AMBULATORY_CARE_PROVIDER_SITE_OTHER): Payer: Self-pay | Admitting: Obstetrics & Gynecology

## 2017-07-08 ENCOUNTER — Ambulatory Visit (HOSPITAL_COMMUNITY)
Admission: RE | Admit: 2017-07-08 | Discharge: 2017-07-08 | Disposition: A | Discharge status: Home / Self Care | Payer: Self-pay | Source: Ambulatory Visit | Attending: Advanced Practice Midwife | Admitting: Advanced Practice Midwife

## 2017-07-08 ENCOUNTER — Other Ambulatory Visit (HOSPITAL_COMMUNITY): Payer: Self-pay | Admitting: Obstetrics and Gynecology

## 2017-07-08 VITALS — BP 129/71 | HR 91 | Wt 203.9 lb

## 2017-07-08 DIAGNOSIS — Z0489 Encounter for examination and observation for other specified reasons: Secondary | ICD-10-CM

## 2017-07-08 DIAGNOSIS — O43103 Malformation of placenta, unspecified, third trimester: Secondary | ICD-10-CM

## 2017-07-08 DIAGNOSIS — O99213 Obesity complicating pregnancy, third trimester: Secondary | ICD-10-CM | POA: Insufficient documentation

## 2017-07-08 DIAGNOSIS — O43193 Other malformation of placenta, third trimester: Secondary | ICD-10-CM | POA: Insufficient documentation

## 2017-07-08 DIAGNOSIS — Z362 Encounter for other antenatal screening follow-up: Secondary | ICD-10-CM

## 2017-07-08 DIAGNOSIS — Z23 Encounter for immunization: Secondary | ICD-10-CM

## 2017-07-08 DIAGNOSIS — Z3A29 29 weeks gestation of pregnancy: Secondary | ICD-10-CM | POA: Insufficient documentation

## 2017-07-08 DIAGNOSIS — IMO0002 Reserved for concepts with insufficient information to code with codable children: Secondary | ICD-10-CM

## 2017-07-08 DIAGNOSIS — O09523 Supervision of elderly multigravida, third trimester: Secondary | ICD-10-CM

## 2017-07-08 DIAGNOSIS — Z3493 Encounter for supervision of normal pregnancy, unspecified, third trimester: Secondary | ICD-10-CM

## 2017-07-08 DIAGNOSIS — O99210 Obesity complicating pregnancy, unspecified trimester: Secondary | ICD-10-CM

## 2017-07-16 ENCOUNTER — Telehealth: Payer: Self-pay | Admitting: Obstetrics & Gynecology

## 2017-07-16 ENCOUNTER — Inpatient Hospital Stay (HOSPITAL_COMMUNITY)
Admission: AD | Admit: 2017-07-16 | Discharge: 2017-07-16 | Disposition: A | Discharge status: Home / Self Care | Payer: Self-pay | Source: Ambulatory Visit | Attending: Obstetrics & Gynecology | Admitting: Obstetrics & Gynecology

## 2017-07-16 ENCOUNTER — Encounter (HOSPITAL_COMMUNITY): Payer: Self-pay

## 2017-07-16 DIAGNOSIS — Z79899 Other long term (current) drug therapy: Secondary | ICD-10-CM | POA: Insufficient documentation

## 2017-07-16 DIAGNOSIS — T1490XA Injury, unspecified, initial encounter: Secondary | ICD-10-CM | POA: Insufficient documentation

## 2017-07-16 DIAGNOSIS — Z8261 Family history of arthritis: Secondary | ICD-10-CM | POA: Insufficient documentation

## 2017-07-16 DIAGNOSIS — Z3A30 30 weeks gestation of pregnancy: Secondary | ICD-10-CM | POA: Insufficient documentation

## 2017-07-16 DIAGNOSIS — O9A213 Injury, poisoning and certain other consequences of external causes complicating pregnancy, third trimester: Secondary | ICD-10-CM

## 2017-07-16 DIAGNOSIS — W109XXA Fall (on) (from) unspecified stairs and steps, initial encounter: Secondary | ICD-10-CM | POA: Insufficient documentation

## 2017-07-16 DIAGNOSIS — Z8249 Family history of ischemic heart disease and other diseases of the circulatory system: Secondary | ICD-10-CM | POA: Insufficient documentation

## 2017-07-16 DIAGNOSIS — Z833 Family history of diabetes mellitus: Secondary | ICD-10-CM | POA: Insufficient documentation

## 2017-07-16 DIAGNOSIS — Z9889 Other specified postprocedural states: Secondary | ICD-10-CM | POA: Insufficient documentation

## 2017-07-16 DIAGNOSIS — O26893 Other specified pregnancy related conditions, third trimester: Secondary | ICD-10-CM | POA: Insufficient documentation

## 2017-07-16 NOTE — MAU Note (Signed)
Fell down stairs around 11 onto back. No bleeding. Has felt some movement

## 2017-07-16 NOTE — MAU Provider Note (Signed)
History     CSN: [id number]  Arrival date and time: 07/16/17 1321   First Provider Initiated Contact with Patient 07/16/17 1430      Chief Complaint  Patient presents with  . Fall   HPI  Cassandra [name] is a 37 y.o. [phone] at [redacted]w[redacted]d who presents s/p fall for evaluation. Fall occurred around 11 am today. States she slipped down ~5 steps and landed on her butt/back. Did not hit her abdomen. Did not hit head or lose consciousness. Denies abdominal pain, LOF, or vaginal bleeding. Positive fetal movement. Reports low back pain. Rates pain 6/10. Describes as sharp tightening pain. Had some radiating pain down leg that has resolved. No numbness or tingling. No difficulty walking.   OB History    Gravida  8   Para  3   Term  3   Preterm  0   AB  4   Living  3     SAB  3   TAB  1   Ectopic  0   Multiple  0   Live Births  3        Obstetric Comments  SABs all around 3 months        Past Medical History:  Diagnosis Date  . Anxiety   . Arrhythmia   . Frequent headaches   . Genital warts   . GERD (gastroesophageal reflux disease)   . H/O chlamydia infection   . H/O hematuria   . Heart palpitations   . Nocturnal headaches     Past Surgical History:  Procedure Laterality Date  . DILATION AND CURETTAGE OF UTERUS      Family History  Problem Relation Age of Onset  . Hyperlipidemia Mother   . Hypertension Mother   . Drug abuse Father   . Arthritis Maternal Grandmother   . Hyperlipidemia Maternal Grandmother   . Heart disease Maternal Grandmother   . Hypertension Maternal Grandmother   . Diabetes Maternal Grandmother   . Heart disease Maternal Grandfather   . Hyperlipidemia Sister   . Heart disease Paternal Grandfather   . Other Neg Hx        pco  . Migraines Neg Hx     Social History   Tobacco Use  . Smoking status: Never Smoker  . Smokeless tobacco: Never Used  Substance Use Topics  . Alcohol use: No    Alcohol/week: 0.0 oz    Frequency: Never     Comment: occasional  . Drug use: No    Comment: former (quit 2007)    Allergies: No Known Allergies  Medications Prior to Admission  Medication Sig Dispense Refill Last Dose  . Prenatal Vit-Fe Fumarate-FA (PRENATAL MULTIVITAMIN) TABS tablet Take 1 tablet by mouth daily at 12 noon.   07/15/2017 at Unknown time    Review of Systems  Constitutional: Negative.   Gastrointestinal: Negative.   Genitourinary: Negative.   Musculoskeletal: Positive for back pain. Negative for neck pain.  Neurological: Negative.  Negative for syncope.   Physical Exam   Blood pressure 113/63, pulse 97, temperature 98.1 F (36.7 C), temperature source Oral, resp. rate 18, last menstrual period 12/15/2016.  Physical Exam  Nursing note and vitals reviewed. Constitutional: She is oriented to person, place, and time. She appears well-developed and well-nourished. No distress.  HENT:  Head: Normocephalic and atraumatic.  Eyes: Conjunctivae are normal. Right eye exhibits no discharge. Left eye exhibits no discharge. No scleral icterus.  Neck: Normal range of motion.  Respiratory: Effort normal.  No respiratory distress.  GI: Soft. There is no tenderness.  Musculoskeletal:       Lumbar back: Normal. She exhibits no tenderness, no swelling and no deformity.  Neurological: She is alert and oriented to person, place, and time.  Skin: Skin is warm and dry. She is not diaphoretic.  Psychiatric: She has a normal mood and affect. Her behavior is normal. Judgment and thought content normal.    MAU Course  Procedures No results found for this or any previous visit (from the past 24 hour(s)).  MDM NST:  Baseline: 145 bpm, Variability: Good {> 6 bpm), Accelerations: Reactive, Decelerations: Absent and no contractions Fetal monitoring x 4 hours completed. Remains category 1 tracing & no ctx.   Hot pack to patient's back for pain. Pt declines pain medication.   Assessment and Plan  A: 1. Traumatic injury during  pregnancy in third trimester   2. [redacted] weeks gestation of pregnancy    P: Discharge home Tylenol & heat/ice for back pain. Discussed reasons to return to MAU for ob related issues after fall and/or back issues Keep f/u with OB  Jorje Guild 07/16/2017, 2:30 PM

## 2017-07-16 NOTE — Telephone Encounter (Signed)
Pt called states fell on her back at home today and wanted to know if she should be seen. Advised pt per RN Morey Hummingbird to go to MAU for evaluation.

## 2017-07-16 NOTE — Discharge Instructions (Signed)
Preventing Injuries During Pregnancy Trauma is the most common cause of injury and death in pregnant women. This can also result in serious harm to the baby or even death. How can injuries affect my pregnancy? Your baby is protected in the womb (uterus) by a sac filled with fluid (amniotic sac). Your baby can be harmed if there is a direct blow to your abdomen and pelvis. Trauma may be caused by:  Falls. These are more common in the second and third trimester of pregnancy.  Automobile accidents.  Domestic violence or assault.  Severe burns, such as from fire or electricity.  These injuries can result in:  Tearing of your uterus.  The placenta pulling away from the wall of the uterus (placental abruption).  The amniotic sac breaking open (rupture of membranes).  Blockage or decrease in the blood supply to your baby.  Going into labor earlier than expected.  Severe injuries to other parts of your body, such as your brain, spine, heart, lungs, or other organs.  Minor falls and low-impact automobile accidents do not usually harm your baby, even if they cause a little harm to you. What can I do to lower my risk? Safety  Remove slippery rugs and loose objects on the floor. They increase your risk of tripping or slipping.  Wear comfortable shoes that have a good grip on the sole. Do not wear high-heeled shoes.  Always wear your seat belt properly when riding in a car. Use both the lap and shoulder belt, with the lap belt below your abdomen. Always practice safe driving. Do not ride on a motorcycle while pregnant. Activity  Avoid walking on wet or slippery floors.  Do not participate in rough and violent activities or sports.  Avoid high-risk situations and activities such as: ? Lifting heavy pots of boiling or hot liquids. ? Fixing electrical problems. ? Being near fires or starting fires. General instructions  Take over-the-counter and prescription medicines only as told by  your health care provider.  Know your blood type and the father's blood type in case you develop vaginal bleeding or experience an injury for which a blood transfusion is needed.  Spousal abuse can be a serious cause of trauma during pregnancy. If you are a victim of domestic violence or assault: ? Call your local emergency services (911 in the [place] ? Contact the [employer] Violence Hotline for help and support. When should I seek immediate medical care? Get help right away if:  You fall on your abdomen or experience any serious blow to your abdomen.  You develop stiffness in your neck or pain after a fall or from other trauma.  You develop a headache or vision problems after a fall or from other trauma.  You do not feel the baby moving after a fall or trauma, or you feel that the baby is not moving as much as before the fall or trauma.  You have been the victim of domestic violence or any other kind of physical attack.  You have been in a car accident.  You develop vaginal bleeding.  You have fluid leaking from the vagina.  You develop uterine contractions. Symptoms include pelvic cramping, pain, or serious low back pain.  You become weak, faint, or have uncontrolled vomiting after trauma.  You have a serious burn. This includes burns to the face, neck, hands, or genitals, or burns greater than the size of your palm anywhere else.  Summary  Trauma is the most common cause of  injury and death in pregnant women and can also lead to injury or death of the baby.  Falls, automobile accidents, domestic violence or assault, and severe burns can injure you or your baby. Make sure to get medical help right away if you experience any of these during your pregnancy.  Take steps to prevent slips or falls in your home, such as avoiding slippery floors and removing loose rugs.  Always wear your seat belt properly when riding in a car. Practice safe driving. This information is  not intended to replace advice given to you by your health care provider. Make sure you discuss any questions you have with your health care provider. Document Released: 04/05/2004 Document Revised: 03/07/2016 Document Reviewed: 03/07/2016 Elsevier Interactive Patient Education  2017 [place].     Back Pain in Pregnancy Back pain during pregnancy is common. Back pain may be caused by several factors that are related to changes during your pregnancy. Follow these instructions at home: Managing pain, stiffness, and swelling  If directed, apply ice for sudden (acute) back pain. ? Put ice in a plastic bag. ? Place a towel between your skin and the bag. ? Leave the ice on for 20 minutes, 2-3 times per day.  If directed, apply heat to the affected area before you exercise: ? Place a towel between your skin and the heat pack or heating pad. ? Leave the heat on for 20-30 minutes. ? Remove the heat if your skin turns bright red. This is especially important if you are unable to feel pain, heat, or cold. You may have a greater risk of getting burned. Activity  Exercise as told by your health care provider. Exercising is the best way to prevent or manage back pain.  Listen to your body when lifting. If lifting hurts, ask for help or bend your knees. This uses your leg muscles instead of your back muscles.  Squat down when picking up something from the floor. Do not bend over.  Only use bed rest as told by your health care provider. Bed rest should only be used for the most severe episodes of back pain. Standing, Sitting, and Lying Down  Do not stand in one place for long periods of time.  Use good posture when sitting. Make sure your head rests over your shoulders and is not hanging forward. Use a pillow on your lower back if necessary.  Try sleeping on your side, preferably the left side, with a pillow or two between your legs. If you are sore after a night's rest, your bed may be too  soft. A firm mattress may provide more support for your back during pregnancy. General instructions  Do not wear high heels.  Eat a healthy diet. Try to gain weight within your health care provider's recommendations.  Use a maternity girdle, elastic sling, or back brace as told by your health care provider.  Take over-the-counter and prescription medicines only as told by your health care provider.  Keep all follow-up visits as told by your health care provider. This is important. This includes any visits with any specialists, such as a physical therapist. Contact a health care provider if:  Your back pain interferes with your daily activities.  You have increasing pain in other parts of your body. Get help right away if:  You develop numbness, tingling, weakness, or problems with the use of your arms or legs.  You develop severe back pain that is not controlled with medicine.  You have  a sudden change in bowel or bladder control.  You develop shortness of breath, dizziness, or you faint.  You develop nausea, vomiting, or sweating.  You have back pain that is a rhythmic, cramping pain similar to labor pains. Labor pain is usually 1-2 minutes apart, lasts for about 1 minute, and involves a bearing down feeling or pressure in your pelvis.  You have back pain and your water breaks or you have vaginal bleeding.  You have back pain or numbness that travels down your leg.  Your back pain developed after you fell.  You develop pain on one side of your back.  You see blood in your urine.  You develop skin blisters in the area of your back pain. This information is not intended to replace advice given to you by your health care provider. Make sure you discuss any questions you have with your health care provider. Document Released: 06/06/2005 Document Revised: 08/04/2015 Document Reviewed: 11/10/2014 Elsevier Interactive Patient Education  2018 [employer].     Fetal  Movement Counts Patient Name: ________________________________________________ Patient Due Date: ____________________ What is a fetal movement count? A fetal movement count is the number of times that you feel your baby move during a certain amount of time. This may also be called a fetal kick count. A fetal movement count is recommended for every pregnant woman. You may be asked to start counting fetal movements as early as week 28 of your pregnancy. Pay attention to when your baby is most active. You may notice your baby's sleep and wake cycles. You may also notice things that make your baby move more. You should do a fetal movement count:  When your baby is normally most active.  At the same time each day.  A good time to count movements is while you are resting, after having something to eat and drink. How do I count fetal movements? 1. Find a quiet, comfortable area. Sit, or lie down on your side. 2. Write down the date, the start time and stop time, and the number of movements that you felt between those two times. Take this information with you to your health care visits. 3. For 2 hours, count kicks, flutters, swishes, rolls, and jabs. You should feel at least 10 movements during 2 hours. 4. You may stop counting after you have felt 10 movements. 5. If you do not feel 10 movements in 2 hours, have something to eat and drink. Then, keep resting and counting for 1 hour. If you feel at least 4 movements during that hour, you may stop counting. Contact a health care provider if:  You feel fewer than 4 movements in 2 hours.  Your baby is not moving like he or she usually does. Date: ____________ Start time: ____________ Stop time: ____________ Movements: ____________ Date: ____________ Start time: ____________ Stop time: ____________ Movements: ____________ Date: ____________ Start time: ____________ Stop time: ____________ Movements: ____________ Date: ____________ Start time:  ____________ Stop time: ____________ Movements: ____________ Date: ____________ Start time: ____________ Stop time: ____________ Movements: ____________ Date: ____________ Start time: ____________ Stop time: ____________ Movements: ____________ Date: ____________ Start time: ____________ Stop time: ____________ Movements: ____________ Date: ____________ Start time: ____________ Stop time: ____________ Movements: ____________ Date: ____________ Start time: ____________ Stop time: ____________ Movements: ____________ This information is not intended to replace advice given to you by your health care provider. Make sure you discuss any questions you have with your health care provider. Document Released: 03/28/2006 Document Revised: 10/26/2015 Document Reviewed: 04/07/2015  [employer] Patient Education  [employer].

## 2017-07-24 ENCOUNTER — Encounter: Payer: Self-pay | Admitting: Family Medicine

## 2017-07-24 ENCOUNTER — Encounter: Payer: Self-pay | Admitting: Obstetrics & Gynecology

## 2017-07-24 ENCOUNTER — Ambulatory Visit (INDEPENDENT_AMBULATORY_CARE_PROVIDER_SITE_OTHER): Payer: Self-pay

## 2017-07-24 VITALS — BP 110/65 | HR 96 | Wt 207.5 lb

## 2017-07-24 DIAGNOSIS — Z3490 Encounter for supervision of normal pregnancy, unspecified, unspecified trimester: Secondary | ICD-10-CM

## 2017-07-24 NOTE — Progress Notes (Signed)
   PRENATAL VISIT NOTE  Subjective:  Cassandra [name] is a 37 y.o. [phone] at [redacted]w[redacted]d being seen today for ongoing prenatal care.  She is currently monitored for the following issues for this low-risk pregnancy and has Heart palpitations; Hypertriglyceridemia; Loss of weight; Constipation; IBS (irritable bowel syndrome); Supervision of low-risk pregnancy; AMA (advanced maternal age) multigravida 35+; Obesity (BMI 30-39.9); and Obesity in pregnancy on their problem list.  Patient reports no complaints.  Contractions: Not present. Vag. Bleeding: None.  Movement: Present. Denies leaking of fluid.   The following portions of the patient's history were reviewed and updated as appropriate: allergies, current medications, past family history, past medical history, past social history, past surgical history and problem list. Problem list updated.  Objective:   Vitals:   07/24/17 1624  BP: 110/65  Pulse: 96  Weight: 207 lb 8 oz (94.1 kg)    Fetal Status: Fetal Heart Rate (bpm): 162 Fundal Height: 31 cm Movement: Present     General:  Alert, oriented and cooperative. Patient is in no acute distress.  Skin: Skin is warm and dry. No rash noted.   Cardiovascular: Normal heart rate noted  Respiratory: Normal respiratory effort, no problems with respiration noted  Abdomen: Soft, gravid, appropriate for gestational age.  Pain/Pressure: Absent     Pelvic: Cervical exam performed Dilation: Closed Effacement (%): Thick Station: Ballotable  Extremities: Normal range of motion.  Edema: None  Mental Status: Normal mood and affect. Normal behavior. Normal judgment and thought content.   Assessment and Plan:  Pregnancy: [id number] at [redacted]w[redacted]d  1. Encounter for supervision of low-risk pregnancy, antepartum - No complaints. Routine care - Patient flying to [place]. Discussed precautions with patient.   Preterm labor symptoms and general obstetric precautions including but not limited to vaginal bleeding,  contractions, leaking of fluid and fetal movement were reviewed in detail with the patient. Please refer to After Visit Summary for other counseling recommendations.  Return in about 2 weeks (around 08/07/2017) for Return OB visit.  Wende Mott, CNM 07/24/17 4:41 PM

## 2017-07-24 NOTE — Patient Instructions (Signed)

## 2017-08-12 ENCOUNTER — Encounter: Payer: Self-pay | Admitting: Obstetrics and Gynecology

## 2017-08-12 ENCOUNTER — Ambulatory Visit (INDEPENDENT_AMBULATORY_CARE_PROVIDER_SITE_OTHER): Payer: Self-pay | Admitting: Obstetrics and Gynecology

## 2017-08-12 ENCOUNTER — Encounter: Payer: Self-pay | Admitting: Advanced Practice Midwife

## 2017-08-12 VITALS — BP 119/69 | HR 88 | Wt 209.7 lb

## 2017-08-12 DIAGNOSIS — Z3490 Encounter for supervision of normal pregnancy, unspecified, unspecified trimester: Secondary | ICD-10-CM

## 2017-08-12 NOTE — Progress Notes (Signed)
   PRENATAL VISIT NOTE  Subjective:  Cassandra [name] is a 37 y.o. [phone] at [redacted]w[redacted]d being seen today for ongoing prenatal care.  She is currently monitored for the following issues for this low-risk pregnancy and has Heart palpitations; Hypertriglyceridemia; Loss of weight; Constipation; IBS (irritable bowel syndrome); Supervision of low-risk pregnancy; AMA (advanced maternal age) multigravida 35+; Obesity (BMI 30-39.9); and Obesity in pregnancy on their problem list.  Patient reports irregular contractions for 3 days, stopped yesterday, worse than her usual occasional contractions.  Contractions: Irregular. Vag. Bleeding: None.  Movement: Present. Denies leaking of fluid.   The following portions of the patient's history were reviewed and updated as appropriate: allergies, current medications, past family history, past medical history, past social history, past surgical history and problem list. Problem list updated.  Objective:   Vitals:   08/12/17 1148  BP: 119/69  Pulse: 88  Weight: 209 lb 11.2 oz (95.1 kg)    Fetal Status:     Movement: Present     General:  Alert, oriented and cooperative. Patient is in no acute distress.  Skin: Skin is warm and dry. No rash noted.   Cardiovascular: Normal heart rate noted  Respiratory: Normal respiratory effort, no problems with respiration noted  Abdomen: Soft, gravid, appropriate for gestational age.  Pain/Pressure: Present     Pelvic: Cervical exam deferred      2/20/-2  Extremities: Normal range of motion.  Edema: None  Mental Status: Normal mood and affect. Normal behavior. Normal judgment and thought content.   Assessment and Plan:  Pregnancy: [id number] at [redacted]w[redacted]d  1. Encounter for supervision of low-risk pregnancy, antepartum Reviewed s/s labor/preterm labor To present with worsening contractions Cultures next visit  Preterm labor symptoms and general obstetric precautions including but not limited to vaginal bleeding, contractions,  leaking of fluid and fetal movement were reviewed in detail with the patient. Please refer to After Visit Summary for other counseling recommendations.  Return in about 2 weeks (around 08/26/2017) for OB visit.  No future appointments.  Sloan Leiter, MD

## 2017-08-12 NOTE — Progress Notes (Signed)
Pain in back since Wednesday, think she may be having contractions.

## 2017-08-12 NOTE — Progress Notes (Signed)
error 

## 2017-08-13 ENCOUNTER — Inpatient Hospital Stay (HOSPITAL_COMMUNITY)
Admission: AD | Admit: 2017-08-13 | Discharge: 2017-08-13 | Disposition: A | Discharge status: Home / Self Care | Payer: Self-pay | Source: Ambulatory Visit | Attending: Obstetrics & Gynecology | Admitting: Obstetrics & Gynecology

## 2017-08-13 ENCOUNTER — Other Ambulatory Visit: Payer: Self-pay

## 2017-08-13 DIAGNOSIS — O99613 Diseases of the digestive system complicating pregnancy, third trimester: Secondary | ICD-10-CM | POA: Insufficient documentation

## 2017-08-13 DIAGNOSIS — Z79899 Other long term (current) drug therapy: Secondary | ICD-10-CM | POA: Insufficient documentation

## 2017-08-13 DIAGNOSIS — Z3493 Encounter for supervision of normal pregnancy, unspecified, third trimester: Secondary | ICD-10-CM

## 2017-08-13 DIAGNOSIS — M549 Dorsalgia, unspecified: Secondary | ICD-10-CM | POA: Insufficient documentation

## 2017-08-13 DIAGNOSIS — K219 Gastro-esophageal reflux disease without esophagitis: Secondary | ICD-10-CM | POA: Insufficient documentation

## 2017-08-13 DIAGNOSIS — O09523 Supervision of elderly multigravida, third trimester: Secondary | ICD-10-CM

## 2017-08-13 DIAGNOSIS — Z3A34 34 weeks gestation of pregnancy: Secondary | ICD-10-CM | POA: Insufficient documentation

## 2017-08-13 DIAGNOSIS — O26893 Other specified pregnancy related conditions, third trimester: Secondary | ICD-10-CM | POA: Insufficient documentation

## 2017-08-13 DIAGNOSIS — F419 Anxiety disorder, unspecified: Secondary | ICD-10-CM | POA: Insufficient documentation

## 2017-08-13 DIAGNOSIS — Z3689 Encounter for other specified antenatal screening: Secondary | ICD-10-CM

## 2017-08-13 DIAGNOSIS — O99343 Other mental disorders complicating pregnancy, third trimester: Secondary | ICD-10-CM | POA: Insufficient documentation

## 2017-08-13 DIAGNOSIS — O9989 Other specified diseases and conditions complicating pregnancy, childbirth and the puerperium: Secondary | ICD-10-CM

## 2017-08-13 LAB — URINALYSIS, ROUTINE W REFLEX MICROSCOPIC
Glucose, UA: NEGATIVE mg/dL
Hgb urine dipstick: NEGATIVE
Ketones, ur: 20 mg/dL — AB
Nitrite: NEGATIVE
PROTEIN: 30 mg/dL — AB
SPECIFIC GRAVITY, URINE: 1.029 (ref 1.005–1.030)
pH: 5 (ref 5.0–8.0)

## 2017-08-13 LAB — WET PREP, GENITAL
Clue Cells Wet Prep HPF POC: NONE SEEN
SPERM: NONE SEEN
TRICH WET PREP: NONE SEEN
YEAST WET PREP: NONE SEEN

## 2017-08-13 MED ORDER — CYCLOBENZAPRINE HCL 10 MG PO TABS
10.0000 mg | ORAL_TABLET | Freq: Three times a day (TID) | ORAL | Status: DC | PRN
  Administered 2017-08-13: 10 mg via ORAL
  Filled 2017-08-13: qty 1

## 2017-08-13 NOTE — Discharge Instructions (Signed)
Braxton Hicks Contractions °Contractions of the uterus can occur throughout pregnancy, but they are not always a sign that you are in labor. You may have practice contractions called Braxton Hicks contractions. These false labor contractions are sometimes confused with true labor. °What are Braxton Hicks contractions? °Braxton Hicks contractions are tightening movements that occur in the muscles of the uterus before labor. Unlike true labor contractions, these contractions do not result in opening (dilation) and thinning of the cervix. Toward the end of pregnancy (32-34 weeks), Braxton Hicks contractions can happen more often and may become stronger. These contractions are sometimes difficult to tell apart from true labor because they can be very uncomfortable. You should not feel embarrassed if you go to the hospital with false labor. °Sometimes, the only way to tell if you are in true labor is for your health care provider to look for changes in the cervix. The health care provider will do a physical exam and may monitor your contractions. If you are not in true labor, the exam should show that your cervix is not dilating and your water has not broken. °If there are other health problems associated with your pregnancy, it is completely safe for you to be sent home with false labor. You may continue to have Braxton Hicks contractions until you go into true labor. °How to tell the difference between true labor and false labor °True labor °· Contractions last 30-70 seconds. °· Contractions become very regular. °· Discomfort is usually felt in the top of the uterus, and it spreads to the lower abdomen and low back. °· Contractions do not go away with walking. °· Contractions usually become more intense and increase in frequency. °· The cervix dilates and gets thinner. °False labor °· Contractions are usually shorter and not as strong as true labor contractions. °· Contractions are usually irregular. °· Contractions  are often felt in the front of the lower abdomen and in the groin. °· Contractions may go away when you walk around or change positions while lying down. °· Contractions get weaker and are shorter-lasting as time goes on. °· The cervix usually does not dilate or become thin. °Follow these instructions at home: °· Take over-the-counter and prescription medicines only as told by your health care provider. °· Keep up with your usual exercises and follow other instructions from your health care provider. °· Eat and drink lightly if you think you are going into labor. °· If Braxton Hicks contractions are making you uncomfortable: °? Change your position from lying down or resting to walking, or change from walking to resting. °? Sit and rest in a tub of warm water. °? Drink enough fluid to keep your urine pale yellow. Dehydration may cause these contractions. °? Do slow and deep breathing several times an hour. °· Keep all follow-up prenatal visits as told by your health care provider. This is important. °Contact a health care provider if: °· You have a fever. °· You have continuous pain in your abdomen. °Get help right away if: °· Your contractions become stronger, more regular, and closer together. °· You have fluid leaking or gushing from your vagina. °· You pass blood-tinged mucus (bloody show). °· You have bleeding from your vagina. °· You have low back pain that you never had before. °· You feel your baby’s head pushing down and causing pelvic pressure. °· Your baby is not moving inside you as much as it used to. °Summary °· Contractions that occur before labor are called Braxton   Hicks contractions, false labor, or practice contractions.  Braxton Hicks contractions are usually shorter, weaker, farther apart, and less regular than true labor contractions. True labor contractions usually become progressively stronger and regular and they become more frequent.  Manage discomfort from [facility] contractions by  changing position, resting in a warm bath, drinking plenty of water, or practicing deep breathing. This information is not intended to replace advice given to you by your health care provider. Make sure you discuss any questions you have with your health care provider. Document Released: 07/12/2016 Document Revised: 07/12/2016 Document Reviewed: 07/12/2016 Elsevier Interactive Patient Education  2018 [place]. Back Pain in Pregnancy Back pain during pregnancy is common. Back pain may be caused by several factors that are related to changes during your pregnancy. Follow these instructions at home: Managing pain, stiffness, and swelling  If directed, apply ice for sudden (acute) back pain. ? Put ice in a plastic bag. ? Place a towel between your skin and the bag. ? Leave the ice on for 20 minutes, 2-3 times per day.  If directed, apply heat to the affected area before you exercise: ? Place a towel between your skin and the heat pack or heating pad. ? Leave the heat on for 20-30 minutes. ? Remove the heat if your skin turns bright red. This is especially important if you are unable to feel pain, heat, or cold. You may have a greater risk of getting burned. Activity  Exercise as told by your health care provider. Exercising is the best way to prevent or manage back pain.  Listen to your body when lifting. If lifting hurts, ask for help or bend your knees. This uses your leg muscles instead of your back muscles.  Squat down when picking up something from the floor. Do not bend over.  Only use bed rest as told by your health care provider. Bed rest should only be used for the most severe episodes of back pain. Standing, Sitting, and Lying Down  Do not stand in one place for long periods of time.  Use good posture when sitting. Make sure your head rests over your shoulders and is not hanging forward. Use a pillow on your lower back if necessary.  Try sleeping on your side, preferably  the left side, with a pillow or two between your legs. If you are sore after a night's rest, your bed may be too soft. A firm mattress may provide more support for your back during pregnancy. General instructions  Do not wear high heels.  Eat a healthy diet. Try to gain weight within your health care provider's recommendations.  Use a maternity girdle, elastic sling, or back brace as told by your health care provider.  Take over-the-counter and prescription medicines only as told by your health care provider.  Keep all follow-up visits as told by your health care provider. This is important. This includes any visits with any specialists, such as a physical therapist. Contact a health care provider if:  Your back pain interferes with your daily activities.  You have increasing pain in other parts of your body. Get help right away if:  You develop numbness, tingling, weakness, or problems with the use of your arms or legs.  You develop severe back pain that is not controlled with medicine.  You have a sudden change in bowel or bladder control.  You develop shortness of breath, dizziness, or you faint.  You develop nausea, vomiting, or sweating.  You have back  pain that is a rhythmic, cramping pain similar to labor pains. Labor pain is usually 1-2 minutes apart, lasts for about 1 minute, and involves a bearing down feeling or pressure in your pelvis.  You have back pain and your water breaks or you have vaginal bleeding.  You have back pain or numbness that travels down your leg.  Your back pain developed after you fell.  You develop pain on one side of your back.  You see blood in your urine.  You develop skin blisters in the area of your back pain. This information is not intended to replace advice given to you by your health care provider. Make sure you discuss any questions you have with your health care provider. Document Released: 06/06/2005 Document Revised: 08/04/2015  Document Reviewed: 11/10/2014 Elsevier Interactive Patient Education  [employer].

## 2017-08-13 NOTE — MAU Note (Signed)
Is contractions "none stop", started on Wed.  Went to appt yesterday was told she is 2 cm. No bleeding or leaking.

## 2017-08-13 NOTE — MAU Provider Note (Signed)
History     CSN: [id number]  Arrival date and time: 08/13/17 1654   First Provider Initiated Contact with Patient 08/13/17 1747      Chief Complaint  Patient presents with  . Contractions   [id number] @34 .3 wks here with ctx. Ctx started last week but had subsided over the weekend. Ctx started back yesterday after a cervical exam in clinic. She is unsure of frequency, feels mostly constant in her back. She feels certain the pain is ctx. Denies VB or LOF. Good FM. No urinary sx. Admits to poor hydration today.   OB History    Gravida  8   Para  3   Term  3   Preterm  0   AB  4   Living  3     SAB  3   TAB  1   Ectopic  0   Multiple  0   Live Births  3        Obstetric Comments  SABs all around 3 months        Past Medical History:  Diagnosis Date  . Anxiety   . Arrhythmia   . Frequent headaches   . Genital warts   . GERD (gastroesophageal reflux disease)   . H/O chlamydia infection   . H/O hematuria   . Heart palpitations   . Nocturnal headaches     Past Surgical History:  Procedure Laterality Date  . DILATION AND CURETTAGE OF UTERUS      Family History  Problem Relation Age of Onset  . Hyperlipidemia Mother   . Hypertension Mother   . Drug abuse Father   . Arthritis Maternal Grandmother   . Hyperlipidemia Maternal Grandmother   . Heart disease Maternal Grandmother   . Hypertension Maternal Grandmother   . Diabetes Maternal Grandmother   . Heart disease Maternal Grandfather   . Hyperlipidemia Sister   . Heart disease Paternal Grandfather   . Other Neg Hx        pco  . Migraines Neg Hx     Social History   Tobacco Use  . Smoking status: Never Smoker  . Smokeless tobacco: Never Used  Substance Use Topics  . Alcohol use: No    Alcohol/week: 0.0 oz    Frequency: Never    Comment: occasional  . Drug use: No    Comment: former (quit 2007)    Allergies: No Known Allergies  Medications Prior to Admission  Medication Sig Dispense  Refill Last Dose  . Prenatal Vit-Fe Fumarate-FA (PRENATAL MULTIVITAMIN) TABS tablet Take 1 tablet by mouth daily at 12 noon.   08/12/2017 at Unknown time    Review of Systems  Gastrointestinal: Negative for abdominal pain.  Genitourinary: Negative for vaginal bleeding and vaginal discharge.  Musculoskeletal: Positive for back pain.   Physical Exam   Blood pressure 123/61, pulse 97, temperature 98.5 F (36.9 C), resp. rate 20, weight 212 lb (96.2 kg), last menstrual period 12/15/2016, SpO2 99 %.  Physical Exam  Constitutional: She is oriented to person, place, and time. She appears well-developed and well-nourished. No distress.  HENT:  Head: Normocephalic and atraumatic.  Neck: Normal range of motion.  Cardiovascular: Normal rate.  Respiratory: Effort normal. No respiratory distress.  GI: Soft. She exhibits no distension. There is no tenderness.  gravid  Genitourinary:  Genitourinary Comments: SVE: 2.5/30/-3, vtx  Musculoskeletal: Normal range of motion.  Neurological: She is alert and oriented to person, place, and time.  Skin: Skin is warm and dry.  Psychiatric: She has a normal mood and affect.  EFM: 145 bpm, mod variability, + accels, no decels Toco: none  Results for orders placed or performed during the hospital encounter of 08/13/17 (from the past 24 hour(s))  Urinalysis, Routine w reflex microscopic     Status: Abnormal   Collection Time: 08/13/17  5:14 PM  Result Value Ref Range   Color, Urine AMBER (A) YELLOW   APPearance CLOUDY (A) CLEAR   Specific Gravity, Urine 1.029 1.005 - 1.030   pH 5.0 5.0 - 8.0   Glucose, UA NEGATIVE NEGATIVE mg/dL   Hgb urine dipstick NEGATIVE NEGATIVE   Bilirubin Urine SMALL (A) NEGATIVE   Ketones, ur 20 (A) NEGATIVE mg/dL   Protein, ur 30 (A) NEGATIVE mg/dL   Nitrite NEGATIVE NEGATIVE   Leukocytes, UA MODERATE (A) NEGATIVE   RBC / HPF 0-5 0 - 5 RBC/hpf   WBC, UA 11-20 0 - 5 WBC/hpf   Bacteria, UA FEW (A) NONE SEEN   Squamous  Epithelial / LPF 21-50 0 - 5   Mucus PRESENT    MAU Course  Procedures Flexeril Heating pad  MDM Labs ordered and reviewed. Cervix unchanged  No evidence of UTI, vaginal infection, or PTL. Pain improved after Flexeril, declines Rx. Discussed comfort measures. Pain likely MSK. Stable for discharge home.  Assessment and Plan  [redacted] weeks gestation Reactive NST Back pain in pregnancy Discharge home Follow up in OB office in 1 week PTL precautions  Allergies as of 08/13/2017   No Known Allergies     Medication List    TAKE these medications   prenatal multivitamin Tabs tablet Take 1 tablet by mouth daily at 12 noon.      Julianne Handler, CNM 08/13/2017, 5:57 PM

## 2017-08-20 ENCOUNTER — Encounter: Payer: Self-pay | Admitting: Obstetrics and Gynecology

## 2017-08-23 ENCOUNTER — Encounter (HOSPITAL_COMMUNITY): Payer: Self-pay

## 2017-08-23 ENCOUNTER — Inpatient Hospital Stay (HOSPITAL_COMMUNITY)
Admission: AD | Admit: 2017-08-23 | Discharge: 2017-08-23 | Disposition: A | Discharge status: Home / Self Care | Payer: Self-pay | Source: Ambulatory Visit | Attending: Obstetrics and Gynecology | Admitting: Obstetrics and Gynecology

## 2017-08-23 ENCOUNTER — Other Ambulatory Visit: Payer: Self-pay

## 2017-08-23 DIAGNOSIS — Z9889 Other specified postprocedural states: Secondary | ICD-10-CM | POA: Insufficient documentation

## 2017-08-23 DIAGNOSIS — O479 False labor, unspecified: Secondary | ICD-10-CM

## 2017-08-23 DIAGNOSIS — O99613 Diseases of the digestive system complicating pregnancy, third trimester: Secondary | ICD-10-CM | POA: Insufficient documentation

## 2017-08-23 DIAGNOSIS — O99343 Other mental disorders complicating pregnancy, third trimester: Secondary | ICD-10-CM | POA: Insufficient documentation

## 2017-08-23 DIAGNOSIS — Z833 Family history of diabetes mellitus: Secondary | ICD-10-CM | POA: Insufficient documentation

## 2017-08-23 DIAGNOSIS — Z3A35 35 weeks gestation of pregnancy: Secondary | ICD-10-CM | POA: Insufficient documentation

## 2017-08-23 DIAGNOSIS — Z8249 Family history of ischemic heart disease and other diseases of the circulatory system: Secondary | ICD-10-CM | POA: Insufficient documentation

## 2017-08-23 DIAGNOSIS — Z8261 Family history of arthritis: Secondary | ICD-10-CM | POA: Insufficient documentation

## 2017-08-23 DIAGNOSIS — K219 Gastro-esophageal reflux disease without esophagitis: Secondary | ICD-10-CM | POA: Insufficient documentation

## 2017-08-23 DIAGNOSIS — O4700 False labor before 37 completed weeks of gestation, unspecified trimester: Secondary | ICD-10-CM

## 2017-08-23 DIAGNOSIS — F419 Anxiety disorder, unspecified: Secondary | ICD-10-CM | POA: Insufficient documentation

## 2017-08-23 DIAGNOSIS — Z79899 Other long term (current) drug therapy: Secondary | ICD-10-CM | POA: Insufficient documentation

## 2017-08-23 DIAGNOSIS — Z813 Family history of other psychoactive substance abuse and dependence: Secondary | ICD-10-CM | POA: Insufficient documentation

## 2017-08-23 LAB — URINALYSIS, ROUTINE W REFLEX MICROSCOPIC
BILIRUBIN URINE: NEGATIVE
Glucose, UA: NEGATIVE mg/dL
HGB URINE DIPSTICK: NEGATIVE
Ketones, ur: 20 mg/dL — AB
NITRITE: NEGATIVE
Protein, ur: NEGATIVE mg/dL
SPECIFIC GRAVITY, URINE: 1.027 (ref 1.005–1.030)
pH: 5 (ref 5.0–8.0)

## 2017-08-23 NOTE — MAU Provider Note (Signed)
History     CSN: [id number]  Arrival date and time: 08/23/17 1643   First Provider Initiated Contact with Patient 08/23/17 1744      Chief Complaint  Patient presents with  . Contractions   HPI Cassandra [name] is a 37 y.o. [id number] at [redacted]w[redacted]d who presents with contractions. She states she has been contracting since approximately 2pm. She reports contractions every minute that last 5 minutes a piece. She denies any leaking or bleeding .Reports good fetal movement. 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   OB History    Gravida  8   Para  3   Term  3   Preterm  0   AB  4   Living  3     SAB  3   TAB  1   Ectopic  0   Multiple  0   Live Births  3        Obstetric Comments  SABs all around 3 months        Past Medical History:  Diagnosis Date  . Anxiety   . Arrhythmia   . Frequent headaches   . Genital warts   . GERD (gastroesophageal reflux disease)   . H/O chlamydia infection   . H/O hematuria   . Heart palpitations   . Nocturnal headaches     Past Surgical History:  Procedure Laterality Date  . DILATION AND CURETTAGE OF UTERUS      Family History  Problem Relation Age of Onset  . Hyperlipidemia Mother   . Hypertension Mother   . Drug abuse Father   . Arthritis Maternal Grandmother   . Hyperlipidemia Maternal Grandmother   . Heart disease Maternal Grandmother   . Hypertension Maternal Grandmother   . Diabetes Maternal Grandmother   . Heart disease Maternal Grandfather   . Hyperlipidemia Sister   . Heart disease Paternal Grandfather   . Other Neg Hx        pco  . Migraines Neg Hx     Social History   Tobacco Use  . Smoking status: Never Smoker  . Smokeless tobacco: Never Used  Substance Use Topics  . Alcohol use: No    Alcohol/week: 0.0 oz    Frequency: Never  . Drug use: No    Comment: former (quit 2007)    Allergies: No Known Allergies  Medications Prior to Admission   Medication Sig Dispense Refill Last Dose  . Prenatal Vit-Fe Fumarate-FA (PRENATAL MULTIVITAMIN) TABS tablet Take 1 tablet by mouth daily at 12 noon.   08/12/2017 at Unknown time    Review of Systems  Constitutional: Negative.  Negative for fatigue and fever.  HENT: Negative.   Respiratory: Negative.  Negative for shortness of breath.   Cardiovascular: Negative.  Negative for chest pain.  Gastrointestinal: Positive for abdominal pain. Negative for constipation, diarrhea, nausea and vomiting.  Genitourinary: Negative.  Negative for dysuria, vaginal bleeding and vaginal discharge.  Neurological: Negative.  Negative for dizziness and headaches.   Physical Exam   Blood pressure 91/67, pulse 94, temperature 98.2 F (36.8 C), temperature source Oral, resp. rate 18, height 5\' 3"  (1.6 m), weight 216 lb 4 oz (98.1 kg), last menstrual period 12/15/2016, SpO2 99 %.  Physical Exam  Nursing note and vitals reviewed. Constitutional: She is oriented to person, place, and time. She appears well-developed and well-nourished. No distress.  HENT:  Head: Normocephalic.  Eyes: Pupils are equal, round,  and reactive to light.  Cardiovascular: Normal rate, regular rhythm and normal heart sounds.  Respiratory: Effort normal and breath sounds normal. No respiratory distress.  GI: Soft. Bowel sounds are normal. She exhibits no distension. There is no tenderness.  Neurological: She is alert and oriented to person, place, and time.  Skin: Skin is warm and dry.  Psychiatric: She has a normal mood and affect. Her behavior is normal. Judgment and thought content normal.    Fetal Tracing:  Baseline: 140 Variability: moderate Accels: 15x15 Decels: none  Toco: 1 uc  Dilation: 2 Effacement (%): Thick Cervical Position: Posterior Station: -3 Exam by:: Haynes Bast CNM   MAU Course  Procedures  MDM NST reactive No signs or symptoms of preterm labor at this time.  Assessment and Plan   1. Preterm  contractions   2. [redacted] weeks gestation of pregnancy    -Discharge home in stable condition -Encouraged patient to increase hydration to prevent increase in braxton hicks contractions -Preterm labor precautions discussed -Patient advised to follow-up with [facility] as scheduled for prenatal care -Patient may return to MAU as needed or if her condition were to change or worsen   [place] 08/23/2017, 5:44 PM

## 2017-08-23 NOTE — MAU Note (Signed)
Pr presents with ctxs that began regularlly @ 1455.  Denies VB or LOF.  Reports +FM.

## 2017-08-23 NOTE — Discharge Instructions (Signed)
Braxton Hicks Contractions °Contractions of the uterus can occur throughout pregnancy, but they are not always a sign that you are in labor. You may have practice contractions called Braxton Hicks contractions. These false labor contractions are sometimes confused with true labor. °What are Braxton Hicks contractions? °Braxton Hicks contractions are tightening movements that occur in the muscles of the uterus before labor. Unlike true labor contractions, these contractions do not result in opening (dilation) and thinning of the cervix. Toward the end of pregnancy (32-34 weeks), Braxton Hicks contractions can happen more often and may become stronger. These contractions are sometimes difficult to tell apart from true labor because they can be very uncomfortable. You should not feel embarrassed if you go to the hospital with false labor. °Sometimes, the only way to tell if you are in true labor is for your health care provider to look for changes in the cervix. The health care provider will do a physical exam and may monitor your contractions. If you are not in true labor, the exam should show that your cervix is not dilating and your water has not broken. °If there are other health problems associated with your pregnancy, it is completely safe for you to be sent home with false labor. You may continue to have Braxton Hicks contractions until you go into true labor. °How to tell the difference between true labor and false labor °True labor °· Contractions last 30-70 seconds. °· Contractions become very regular. °· Discomfort is usually felt in the top of the uterus, and it spreads to the lower abdomen and low back. °· Contractions do not go away with walking. °· Contractions usually become more intense and increase in frequency. °· The cervix dilates and gets thinner. °False labor °· Contractions are usually shorter and not as strong as true labor contractions. °· Contractions are usually irregular. °· Contractions  are often felt in the front of the lower abdomen and in the groin. °· Contractions may go away when you walk around or change positions while lying down. °· Contractions get weaker and are shorter-lasting as time goes on. °· The cervix usually does not dilate or become thin. °Follow these instructions at home: °· Take over-the-counter and prescription medicines only as told by your health care provider. °· Keep up with your usual exercises and follow other instructions from your health care provider. °· Eat and drink lightly if you think you are going into labor. °· If Braxton Hicks contractions are making you uncomfortable: °? Change your position from lying down or resting to walking, or change from walking to resting. °? Sit and rest in a tub of warm water. °? Drink enough fluid to keep your urine pale yellow. Dehydration may cause these contractions. °? Do slow and deep breathing several times an hour. °· Keep all follow-up prenatal visits as told by your health care provider. This is important. °Contact a health care provider if: °· You have a fever. °· You have continuous pain in your abdomen. °Get help right away if: °· Your contractions become stronger, more regular, and closer together. °· You have fluid leaking or gushing from your vagina. °· You pass blood-tinged mucus (bloody show). °· You have bleeding from your vagina. °· You have low back pain that you never had before. °· You feel your baby’s head pushing down and causing pelvic pressure. °· Your baby is not moving inside you as much as it used to. °Summary °· Contractions that occur before labor are called Braxton   Hicks contractions, false labor, or practice contractions. °· Braxton Hicks contractions are usually shorter, weaker, farther apart, and less regular than true labor contractions. True labor contractions usually become progressively stronger and regular and they become more frequent. °· Manage discomfort from Braxton Hicks contractions by  changing position, resting in a warm bath, drinking plenty of water, or practicing deep breathing. °This information is not intended to replace advice given to you by your health care provider. Make sure you discuss any questions you have with your health care provider. °Document Released: 07/12/2016 Document Revised: 07/12/2016 Document Reviewed: 07/12/2016 °Elsevier Interactive Patient Education © 2018 [employer] Inc[employer] ° °

## 2017-08-27 ENCOUNTER — Ambulatory Visit (INDEPENDENT_AMBULATORY_CARE_PROVIDER_SITE_OTHER): Payer: Self-pay | Admitting: Family Medicine

## 2017-08-27 VITALS — BP 128/75 | HR 87 | Wt 212.7 lb

## 2017-08-27 DIAGNOSIS — Z3493 Encounter for supervision of normal pregnancy, unspecified, third trimester: Secondary | ICD-10-CM

## 2017-08-27 DIAGNOSIS — O09523 Supervision of elderly multigravida, third trimester: Secondary | ICD-10-CM

## 2017-08-27 DIAGNOSIS — Z113 Encounter for screening for infections with a predominantly sexual mode of transmission: Secondary | ICD-10-CM

## 2017-08-27 DIAGNOSIS — O99210 Obesity complicating pregnancy, unspecified trimester: Secondary | ICD-10-CM

## 2017-08-27 LAB — OB RESULTS CONSOLE GC/CHLAMYDIA: GC PROBE AMP, GENITAL: NEGATIVE

## 2017-08-27 LAB — OB RESULTS CONSOLE GBS: STREP GROUP B AG: NEGATIVE

## 2017-08-27 NOTE — Progress Notes (Signed)
   PRENATAL VISIT NOTE  Subjective:  Cassandra [name] is a 37 y.o. [phone] at [redacted]w[redacted]d being seen today for ongoing prenatal care.  She is currently monitored for the following issues for this low-risk pregnancy and has Heart palpitations; Hypertriglyceridemia; Loss of weight; Constipation; IBS (irritable bowel syndrome); Supervision of low-risk pregnancy; AMA (advanced maternal age) multigravida 35+; Obesity (BMI 30-39.9); and Obesity in pregnancy on their problem list.  Patient reports no complaints.  Contractions: Irregular. Vag. Bleeding: None.  Movement: Present. Denies leaking of fluid.   The following portions of the patient's history were reviewed and updated as appropriate: allergies, current medications, past family history, past medical history, past social history, past surgical history and problem list. Problem list updated.  Objective:   Vitals:   08/27/17 1322  BP: 128/75  Pulse: 87  Weight: 212 lb 11.2 oz (96.5 kg)    Fetal Status: Fetal Heart Rate (bpm): 159 Fundal Height: 37 cm Movement: Present  Presentation: Vertex  General:  Alert, oriented and cooperative. Patient is in no acute distress.  Skin: Skin is warm and dry. No rash noted.   Cardiovascular: Normal heart rate noted  Respiratory: Normal respiratory effort, no problems with respiration noted  Abdomen: Soft, gravid, appropriate for gestational age.  Pain/Pressure: Present     Pelvic: Cervical exam performed Dilation: 1.5 Effacement (%): Thick Station: Ballotable  Extremities: Normal range of motion.  Edema: None  Mental Status: Normal mood and affect. Normal behavior. Normal judgment and thought content.   Assessment and Plan:  Pregnancy: [id number] at [redacted]w[redacted]d  1. Encounter for supervision of low-risk pregnancy in third trimester - Culture, beta strep (group b only) - GC/Chlamydia probe amp ([place])not at [facility]  2. Multigravida of advanced maternal age in third trimester Had negative quad screen  3. Obesity  in pregnancy  Preterm labor symptoms and general obstetric precautions including but not limited to vaginal bleeding, contractions, leaking of fluid and fetal movement were reviewed in detail with the patient. Please refer to After Visit Summary for other counseling recommendations.  Return in about 1 week (around 09/03/2017) for LOB.  No future appointments.  Cassandra Shelter, MD

## 2017-08-28 LAB — GC/CHLAMYDIA PROBE AMP (~~LOC~~) NOT AT ARMC
Chlamydia: NEGATIVE
NEISSERIA GONORRHEA: NEGATIVE

## 2017-08-31 LAB — CULTURE, BETA STREP (GROUP B ONLY): Strep Gp B Culture: NEGATIVE

## 2017-09-04 ENCOUNTER — Other Ambulatory Visit: Payer: Self-pay

## 2017-09-04 ENCOUNTER — Ambulatory Visit (INDEPENDENT_AMBULATORY_CARE_PROVIDER_SITE_OTHER): Payer: Self-pay | Admitting: Medical

## 2017-09-04 VITALS — BP 124/62 | HR 80 | Wt 215.0 lb

## 2017-09-04 DIAGNOSIS — Z3493 Encounter for supervision of normal pregnancy, unspecified, third trimester: Secondary | ICD-10-CM

## 2017-09-04 DIAGNOSIS — O09523 Supervision of elderly multigravida, third trimester: Secondary | ICD-10-CM

## 2017-09-04 DIAGNOSIS — O99210 Obesity complicating pregnancy, unspecified trimester: Secondary | ICD-10-CM

## 2017-09-04 NOTE — Patient Instructions (Signed)

## 2017-09-04 NOTE — Progress Notes (Signed)
   PRENATAL VISIT NOTE  Subjective:  Cassandra [name] is a 37 y.o. [phone] at [redacted]w[redacted]d being seen today for ongoing prenatal care.  She is currently monitored for the following issues for this low-risk pregnancy and has Heart palpitations; Hypertriglyceridemia; Loss of weight; Constipation; IBS (irritable bowel syndrome); Supervision of low-risk pregnancy; AMA (advanced maternal age) multigravida 35+; Obesity (BMI 30-39.9); and Obesity in pregnancy on their problem list.  Patient reports no complaints.  Contractions: Irregular. Vag. Bleeding: None.  Movement: Present. Denies leaking of fluid.   The following portions of the patient's history were reviewed and updated as appropriate: allergies, current medications, past family history, past medical history, past social history, past surgical history and problem list. Problem list updated.  Objective:   Vitals:   09/04/17 1520  BP: 124/62  Pulse: 80  Weight: 215 lb (97.5 kg)    Fetal Status: Fetal Heart Rate (bpm): 168   Movement: Present  Presentation: Vertex  General:  Alert, oriented and cooperative. Patient is in no acute distress.  Skin: Skin is warm and dry. No rash noted.   Cardiovascular: Normal heart rate noted  Respiratory: Normal respiratory effort, no problems with respiration noted  Abdomen: Soft, gravid, appropriate for gestational age.  Pain/Pressure: Present     Pelvic: Cervical exam performed Dilation: 2 Effacement (%): Thick Station: -3  Extremities: Normal range of motion.  Edema: None  Mental Status: Normal mood and affect. Normal behavior. Normal judgment and thought content.   Assessment and Plan:  Pregnancy: [id number] at [redacted]w[redacted]d  1. Encounter for supervision of low-risk pregnancy in third trimester - GBS negative from last visit, discussed with patient   2. Multigravida of advanced maternal age in third trimester - < 11 y.o. No change in management   3. Obesity in pregnancy  Term labor symptoms and general  obstetric precautions including but not limited to vaginal bleeding, contractions, leaking of fluid and fetal movement were reviewed in detail with the patient. Please refer to After Visit Summary for other counseling recommendations.  Return in about 1 week (around 09/11/2017) for LOB.  Future Appointments  Date Time Provider [place]  09/10/2017  2:55 PM [place], [place]  09/18/2017  2:55 PM Katheren Shams, DO [facility]    Kerry Hough, PA-C

## 2017-09-10 ENCOUNTER — Ambulatory Visit (INDEPENDENT_AMBULATORY_CARE_PROVIDER_SITE_OTHER): Payer: Self-pay | Admitting: Obstetrics and Gynecology

## 2017-09-10 VITALS — BP 118/70 | HR 100 | Wt 216.5 lb

## 2017-09-10 DIAGNOSIS — O09523 Supervision of elderly multigravida, third trimester: Secondary | ICD-10-CM

## 2017-09-10 DIAGNOSIS — O99210 Obesity complicating pregnancy, unspecified trimester: Secondary | ICD-10-CM

## 2017-09-10 DIAGNOSIS — Z3493 Encounter for supervision of normal pregnancy, unspecified, third trimester: Secondary | ICD-10-CM

## 2017-09-10 NOTE — Patient Instructions (Signed)
Labor Induction Labor induction is when steps are taken to cause a pregnant woman to begin the labor process. Most women go into labor on their own between 37 weeks and 42 weeks of the pregnancy. When this does not happen or when there is a medical need, methods may be used to induce labor. Labor induction causes a pregnant woman's uterus to contract. It also causes the cervix to soften (ripen), open (dilate), and thin out (efface). Usually, labor is not induced before 39 weeks of the pregnancy unless there is a problem with the baby or mother. Before inducing labor, your health care provider will consider a number of factors, including the following:  The medical condition of you and the baby.  How many weeks along you are.  The status of the baby's lung maturity.  The condition of the cervix.  The position of the baby. What are the reasons for labor induction? Labor may be induced for the following reasons:  The health of the baby or mother is at risk.  The pregnancy is overdue by 1 week or more.  The water breaks but labor does not start on its own.  The mother has a health condition or serious illness, such as high blood pressure, infection, placental abruption, or diabetes.  The amniotic fluid amounts are low around the baby.  The baby is distressed. Convenience or wanting the baby to be born on a certain date is not a reason for inducing labor. What methods are used for labor induction? Several methods of labor induction may be used, such as:  Prostaglandin medicine. This medicine causes the cervix to dilate and ripen. The medicine will also start contractions. It can be taken by mouth or by inserting a suppository into the vagina.  Inserting a thin tube (catheter) with a balloon on the end into the vagina to dilate the cervix. Once inserted, the balloon is expanded with water, which causes the cervix to open.  Stripping the membranes. Your health care provider separates  amniotic sac tissue from the cervix, causing the cervix to be stretched and causing the release of a hormone called progesterone. This may cause the uterus to contract. It is often done during an office visit. You will be sent home to wait for the contractions to begin. You will then come in for an induction.  Breaking the water. Your health care provider makes a hole in the amniotic sac using a small instrument. Once the amniotic sac breaks, contractions should begin. This may still take hours to see an effect.  Medicine to trigger or strengthen contractions. This medicine is given through an IV access tube inserted into a vein in your arm. All of the methods of induction, besides stripping the membranes, will be done in the hospital. Induction is done in the hospital so that you and the baby can be carefully monitored. How long does it take for labor to be induced? Some inductions can take up to 2-3 days. Depending on the cervix, it usually takes less time. It takes longer when you are induced early in the pregnancy or if this is your first pregnancy. If a mother is still pregnant and the induction has been going on for 2-3 days, either the mother will be sent home or a cesarean delivery will be needed. What are the risks associated with labor induction? Some of the risks of induction include:  Changes in fetal heart rate, such as too high, too low, or erratic.  Fetal distress.    Chance of infection for the mother and baby.  Increased chance of having a cesarean delivery.  Breaking off (abruption) of the placenta from the uterus (rare).  Uterine rupture (very rare). When induction is needed for medical reasons, the benefits of induction may outweigh the risks. What are some reasons for not inducing labor? Labor induction should not be done if:  It is shown that your baby does not tolerate labor.  You have had previous surgeries on your uterus, such as a myomectomy or the removal of  fibroids.  Your placenta lies very low in the uterus and blocks the opening of the cervix (placenta previa).  Your baby is not in a head-down position.  The umbilical cord drops down into the birth canal in front of the baby. This could cut off the baby's blood and oxygen supply.  You have had a previous cesarean delivery.  There are unusual circumstances, such as the baby being extremely premature. This information is not intended to replace advice given to you by your health care provider. Make sure you discuss any questions you have with your health care provider. Document Released: 07/18/2006 Document Revised: 08/04/2015 Document Reviewed: 09/25/2012 Elsevier Interactive Patient Education  2017 [employer] Inc.  

## 2017-09-10 NOTE — Progress Notes (Addendum)
Subjective:  Cassandra [name] is a 37 y.o. [phone] at [redacted]w[redacted]d being seen today for ongoing prenatal care.  She is currently monitored for the following issues for this low-risk pregnancy and has Heart palpitations; Hypertriglyceridemia; Loss of weight; Constipation; IBS (irritable bowel syndrome); Supervision of low-risk pregnancy; AMA (advanced maternal age) multigravida 35+; Obesity (BMI 30-39.9); and Obesity in pregnancy on their problem list. Patient's main concern today was the amount her cervix was dilated. She is further concerned about the baby's movement and states it occurs for about 10-15 minutes a day.  Patient reports occasional contractions and hot flashes. She states she feels more pressure as she gets up. Vag. Bleeding: None.  Movement: (!) Decreased. Denies leaking of fluid.   The following portions of the patient's history were reviewed and updated as appropriate: allergies, current medications, past family history, past medical history, past social history, past surgical history and problem list. Problem list updated.  Objective:   Vitals:   09/10/17 1511  BP: 118/70  Pulse: 100  Weight: 216 lb 8 oz (98.2 kg)    Fetal Status: Fetal Heart Rate (bpm): 153   Movement: (!) Decreased  Presentation: Vertex  General:  Alert, oriented and cooperative. Patient is in no acute distress.  Skin: Skin is warm and dry. No rash noted.   Cardiovascular: Normal heart rate noted  Respiratory: Normal respiratory effort, no problems with respiration noted  Abdomen: Soft, gravid, appropriate for gestational age. Pain/Pressure: Present     Pelvic: Vag. Bleeding: None Vag D/C Character: Mucous   Cervical exam performed Dilation: 2 Effacement (%): Thick Station: -3  Extremities: Normal range of motion.  Edema: None  Mental Status: Normal mood and affect. Normal behavior. Normal judgment and thought content.     Assessment and Plan:  Pregnancy: [id number] at [redacted]w[redacted]d  1. Multigravida of advanced  maternal age in third trimester Neg quad.   2. Encounter for supervision of low-risk pregnancy in third trimester Discussed induction of labor at 41 weeks. Continue routine care. Not really having decreased fetal movement. Will fill spirts of really active movement and then none for a few hours. Counseled.  3. Obesity in pregnancy Weight gain of #45. The patient is advised to increase water intake to six 8 ounce glasses a day. Lifestyle modification was discussed involving exercise and introducing more fruits, vegetables, and whole grains into her diet.   Term labor symptoms and general obstetric precautions including but not limited to vaginal bleeding, contractions, leaking of fluid and fetal movement were reviewed in detail with the patient.  Please refer to After Visit Summary for other counseling recommendations.  Return in about 1 week (around 09/17/2017) for ob visit.   Sherie Don, Student-PA   NOTE ATTESTATION  I confirm that I have verified the information documented in the PA student's note and that I have also personally reperformed the physical exam and all medical decision making activities. Additionally any edits have been made to the note by me.   Luiz Blare, DO 09/10/2017, 6:03 PM

## 2017-09-18 ENCOUNTER — Ambulatory Visit (INDEPENDENT_AMBULATORY_CARE_PROVIDER_SITE_OTHER): Payer: Self-pay | Admitting: Obstetrics and Gynecology

## 2017-09-18 VITALS — BP 117/67 | HR 80

## 2017-09-18 DIAGNOSIS — Z3493 Encounter for supervision of normal pregnancy, unspecified, third trimester: Secondary | ICD-10-CM

## 2017-09-18 DIAGNOSIS — O99210 Obesity complicating pregnancy, unspecified trimester: Secondary | ICD-10-CM

## 2017-09-18 DIAGNOSIS — O09523 Supervision of elderly multigravida, third trimester: Secondary | ICD-10-CM

## 2017-09-18 NOTE — Patient Instructions (Signed)
Labor Induction Labor induction is when steps are taken to cause a pregnant woman to begin the labor process. Most women go into labor on their own between 37 weeks and 42 weeks of the pregnancy. When this does not happen or when there is a medical need, methods may be used to induce labor. Labor induction causes a pregnant woman's uterus to contract. It also causes the cervix to soften (ripen), open (dilate), and thin out (efface). Usually, labor is not induced before 39 weeks of the pregnancy unless there is a problem with the baby or mother. Before inducing labor, your health care provider will consider a number of factors, including the following:  The medical condition of you and the baby.  How many weeks along you are.  The status of the baby's lung maturity.  The condition of the cervix.  The position of the baby. What are the reasons for labor induction? Labor may be induced for the following reasons:  The health of the baby or mother is at risk.  The pregnancy is overdue by 1 week or more.  The water breaks but labor does not start on its own.  The mother has a health condition or serious illness, such as high blood pressure, infection, placental abruption, or diabetes.  The amniotic fluid amounts are low around the baby.  The baby is distressed. Convenience or wanting the baby to be born on a certain date is not a reason for inducing labor. What methods are used for labor induction? Several methods of labor induction may be used, such as:  Prostaglandin medicine. This medicine causes the cervix to dilate and ripen. The medicine will also start contractions. It can be taken by mouth or by inserting a suppository into the vagina.  Inserting a thin tube (catheter) with a balloon on the end into the vagina to dilate the cervix. Once inserted, the balloon is expanded with water, which causes the cervix to open.  Stripping the membranes. Your health care provider separates  amniotic sac tissue from the cervix, causing the cervix to be stretched and causing the release of a hormone called progesterone. This may cause the uterus to contract. It is often done during an office visit. You will be sent home to wait for the contractions to begin. You will then come in for an induction.  Breaking the water. Your health care provider makes a hole in the amniotic sac using a small instrument. Once the amniotic sac breaks, contractions should begin. This may still take hours to see an effect.  Medicine to trigger or strengthen contractions. This medicine is given through an IV access tube inserted into a vein in your arm. All of the methods of induction, besides stripping the membranes, will be done in the hospital. Induction is done in the hospital so that you and the baby can be carefully monitored. How long does it take for labor to be induced? Some inductions can take up to 2-3 days. Depending on the cervix, it usually takes less time. It takes longer when you are induced early in the pregnancy or if this is your first pregnancy. If a mother is still pregnant and the induction has been going on for 2-3 days, either the mother will be sent home or a cesarean delivery will be needed. What are the risks associated with labor induction? Some of the risks of induction include:  Changes in fetal heart rate, such as too high, too low, or erratic.  Fetal distress.    Chance of infection for the mother and baby.  Increased chance of having a cesarean delivery.  Breaking off (abruption) of the placenta from the uterus (rare).  Uterine rupture (very rare). When induction is needed for medical reasons, the benefits of induction may outweigh the risks. What are some reasons for not inducing labor? Labor induction should not be done if:  It is shown that your baby does not tolerate labor.  You have had previous surgeries on your uterus, such as a myomectomy or the removal of  fibroids.  Your placenta lies very low in the uterus and blocks the opening of the cervix (placenta previa).  Your baby is not in a head-down position.  The umbilical cord drops down into the birth canal in front of the baby. This could cut off the baby's blood and oxygen supply.  You have had a previous cesarean delivery.  There are unusual circumstances, such as the baby being extremely premature. This information is not intended to replace advice given to you by your health care provider. Make sure you discuss any questions you have with your health care provider. Document Released: 07/18/2006 Document Revised: 08/04/2015 Document Reviewed: 09/25/2012 Elsevier Interactive Patient Education  2017 [employer] Inc.  

## 2017-09-19 ENCOUNTER — Inpatient Hospital Stay (HOSPITAL_COMMUNITY)
Admission: AD | Admit: 2017-09-19 | Discharge: 2017-09-20 | Disposition: A | Discharge status: Home / Self Care | Payer: Self-pay | Source: Ambulatory Visit | Attending: Obstetrics & Gynecology | Admitting: Obstetrics & Gynecology

## 2017-09-19 ENCOUNTER — Encounter (HOSPITAL_COMMUNITY): Payer: Self-pay | Admitting: *Deleted

## 2017-09-19 DIAGNOSIS — Z3493 Encounter for supervision of normal pregnancy, unspecified, third trimester: Secondary | ICD-10-CM

## 2017-09-19 DIAGNOSIS — O471 False labor at or after 37 completed weeks of gestation: Secondary | ICD-10-CM | POA: Insufficient documentation

## 2017-09-19 DIAGNOSIS — O479 False labor, unspecified: Secondary | ICD-10-CM

## 2017-09-19 DIAGNOSIS — O09523 Supervision of elderly multigravida, third trimester: Secondary | ICD-10-CM

## 2017-09-19 NOTE — MAU Note (Signed)
PT SAYS YESTERDAY IN CLINIC - VE 5 CM- STRIPPED HER MEMBRANES.   DENIES HSV AND MRSA. GBS-  NEG.   STRONG UC-  ALL DAY.

## 2017-09-20 ENCOUNTER — Telehealth: Payer: Self-pay | Admitting: *Deleted

## 2017-09-20 DIAGNOSIS — O471 False labor at or after 37 completed weeks of gestation: Secondary | ICD-10-CM

## 2017-09-20 NOTE — Progress Notes (Signed)
Subjective:  Cassandra [name] is a 37 y.o. [phone] at [redacted]w[redacted]d being seen today for ongoing prenatal care.  She is currently monitored for the following issues for this low-risk pregnancy and has Heart palpitations; Hypertriglyceridemia; Loss of weight; Constipation; IBS (irritable bowel syndrome); Supervision of low-risk pregnancy; AMA (advanced maternal age) multigravida 35+; Obesity (BMI 30-39.9); and Obesity in pregnancy on their problem list.  Patient reports no complaints. Desires elective induction at 40wks. Contractions: Irregular. Vag. Bleeding: None.  Movement: Present. Denies leaking of fluid.   The following portions of the patient's history were reviewed and updated as appropriate: allergies, current medications, past family history, past medical history, past social history, past surgical history and problem list. Problem list updated.  Objective:   Vitals:   09/18/17 1547  BP: 117/67  Pulse: 80    Fetal Status: Fetal Heart Rate (bpm): 143 Fundal Height: 38 cm Movement: Present  Presentation: Vertex  General:  Alert, oriented and cooperative. Patient is in no acute distress.  Skin: Skin is warm and dry. No rash noted.   Cardiovascular: Normal heart rate noted  Respiratory: Normal respiratory effort, no problems with respiration noted  Abdomen: Soft, gravid, appropriate for gestational age. Pain/Pressure: Present     Pelvic: Vag. Bleeding: None     Cervical exam performed Dilation: 4 Effacement (%): 50 Station: -3  Extremities: Normal range of motion.  Edema: None  Mental Status: Normal mood and affect. Normal behavior. Normal judgment and thought content.   Urinalysis:      Assessment and Plan:  Pregnancy: [id number] at [redacted]w[redacted]d  1. Encounter for supervision of low-risk pregnancy in third trimester Routine care. Elective induction to be scheduled for 40wks. Patient is a multigravida with Bishop score of 6 today.   2. Multigravida of advanced maternal age in third trimester Neg  quad  3. Obesity in pregnancy   Term labor symptoms and general obstetric precautions including but not limited to vaginal bleeding, contractions, leaking of fluid and fetal movement were reviewed in detail with the patient. Please refer to After Visit Summary for other counseling recommendations.  Return in about 6 weeks (around 10/30/2017) for postpartum.   Katheren Shams, DO

## 2017-09-20 NOTE — Telephone Encounter (Signed)
Cassandra [name] called stating she is supposed to be induced tomorrow but no one has called her. Discussed with  Dr. Luiz Blare and IOL scheduled for tomorrow at 9 am as a direct admit. [employer] and notified her. She voices understanding.

## 2017-09-20 NOTE — MAU Provider Note (Signed)
Pt observed for 4.5 hours per pt insistence that her labors never hurt.  No cervical change in 4.5 hours.  Offered therapeutic rest or continued observation, declined both. States that Dr. Gerarda Fraction plans to IOL on Saturday. Not on schedule, no office notes from 7/11, no medical indication for IOL before 41 weeks. Advised to call clinic tomorrow and ask to speak with Dr. Gerarda Fraction. FHR reactive w/o decels.  Ctx mild and irregular, q 1-4 minutes per pt. DC home w/labor precautions.

## 2017-09-21 ENCOUNTER — Encounter (HOSPITAL_COMMUNITY): Payer: Self-pay | Admitting: *Deleted

## 2017-09-21 ENCOUNTER — Inpatient Hospital Stay (HOSPITAL_COMMUNITY)
Admission: AD | Admit: 2017-09-21 | Discharge: 2017-09-23 | DRG: 807 | Disposition: A | Discharge status: Home / Self Care | Payer: Self-pay | Attending: Obstetrics & Gynecology | Admitting: Obstetrics & Gynecology

## 2017-09-21 DIAGNOSIS — O09523 Supervision of elderly multigravida, third trimester: Secondary | ICD-10-CM

## 2017-09-21 DIAGNOSIS — Z3490 Encounter for supervision of normal pregnancy, unspecified, unspecified trimester: Secondary | ICD-10-CM

## 2017-09-21 DIAGNOSIS — Z8349 Family history of other endocrine, nutritional and metabolic diseases: Secondary | ICD-10-CM

## 2017-09-21 DIAGNOSIS — Z8249 Family history of ischemic heart disease and other diseases of the circulatory system: Secondary | ICD-10-CM

## 2017-09-21 DIAGNOSIS — Z3A40 40 weeks gestation of pregnancy: Secondary | ICD-10-CM

## 2017-09-21 DIAGNOSIS — Z3493 Encounter for supervision of normal pregnancy, unspecified, third trimester: Secondary | ICD-10-CM

## 2017-09-21 HISTORY — DX: Encounter for supervision of normal pregnancy, unspecified, unspecified trimester: Z34.90

## 2017-09-21 LAB — CBC
HCT: 33.2 % — ABNORMAL LOW (ref 36.0–46.0)
HEMOGLOBIN: 11.3 g/dL — AB (ref 12.0–15.0)
MCH: 29.9 pg (ref 26.0–34.0)
MCHC: 34 g/dL (ref 30.0–36.0)
MCV: 87.8 fL (ref 78.0–100.0)
Platelets: 278 10*3/uL (ref 150–400)
RBC: 3.78 MIL/uL — AB (ref 3.87–5.11)
RDW: 14.7 % (ref 11.5–15.5)
WBC: 11 10*3/uL — ABNORMAL HIGH (ref 4.0–10.5)

## 2017-09-21 LAB — URINALYSIS, ROUTINE W REFLEX MICROSCOPIC
Bacteria, UA: NONE SEEN
Bilirubin Urine: NEGATIVE
Glucose, UA: NEGATIVE mg/dL
KETONES UR: NEGATIVE mg/dL
Nitrite: NEGATIVE
PH: 5 (ref 5.0–8.0)
Protein, ur: NEGATIVE mg/dL
SPECIFIC GRAVITY, URINE: 1.03 (ref 1.005–1.030)

## 2017-09-21 LAB — TYPE AND SCREEN
ABO/RH(D): A POS
Antibody Screen: NEGATIVE

## 2017-09-21 MED ORDER — PRENATAL MULTIVITAMIN CH
1.0000 | ORAL_TABLET | Freq: Every day | ORAL | Status: DC
  Administered 2017-09-22 – 2017-09-23 (×2): 1 via ORAL
  Filled 2017-09-21 (×2): qty 1

## 2017-09-21 MED ORDER — TERBUTALINE SULFATE 1 MG/ML IJ SOLN
0.2500 mg | Freq: Once | INTRAMUSCULAR | Status: DC | PRN
  Filled 2017-09-21: qty 1

## 2017-09-21 MED ORDER — IBUPROFEN 600 MG PO TABS
600.0000 mg | ORAL_TABLET | Freq: Four times a day (QID) | ORAL | Status: DC
  Administered 2017-09-21 – 2017-09-23 (×7): 600 mg via ORAL
  Filled 2017-09-21 (×7): qty 1

## 2017-09-21 MED ORDER — ONDANSETRON HCL 4 MG/2ML IJ SOLN: 4.0000 mg | INTRAMUSCULAR | Status: DC | PRN

## 2017-09-21 MED ORDER — SIMETHICONE 80 MG PO CHEW: 80.0000 mg | CHEWABLE_TABLET | ORAL | Status: DC | PRN

## 2017-09-21 MED ORDER — SENNOSIDES-DOCUSATE SODIUM 8.6-50 MG PO TABS
2.0000 | ORAL_TABLET | ORAL | Status: DC
  Filled 2017-09-21 (×2): qty 2

## 2017-09-21 MED ORDER — LACTATED RINGERS IV SOLN
INTRAVENOUS | Status: DC
  Administered 2017-09-21: 1000 mL via INTRAVENOUS

## 2017-09-21 MED ORDER — ONDANSETRON HCL 4 MG PO TABS: 4.0000 mg | ORAL_TABLET | ORAL | Status: DC | PRN

## 2017-09-21 MED ORDER — WITCH HAZEL-GLYCERIN EX PADS: 1.0000 "application " | MEDICATED_PAD | CUTANEOUS | Status: DC | PRN

## 2017-09-21 MED ORDER — COCONUT OIL OIL: 1.0000 "application " | TOPICAL_OIL | Status: DC | PRN

## 2017-09-21 MED ORDER — FENTANYL 2.5 MCG/ML BUPIVACAINE 1/10 % EPIDURAL INFUSION (WH - ANES): 14.0000 mL/h | INTRAMUSCULAR | Status: DC | PRN

## 2017-09-21 MED ORDER — ACETAMINOPHEN 325 MG PO TABS: 650.0000 mg | ORAL_TABLET | ORAL | Status: DC | PRN

## 2017-09-21 MED ORDER — OXYTOCIN BOLUS FROM INFUSION
500.0000 mL | Freq: Once | INTRAVENOUS | Status: AC
  Administered 2017-09-21: 500 mL via INTRAVENOUS

## 2017-09-21 MED ORDER — EPHEDRINE 5 MG/ML INJ
10.0000 mg | INTRAVENOUS | Status: DC | PRN
  Filled 2017-09-21: qty 2

## 2017-09-21 MED ORDER — FENTANYL CITRATE (PF) 100 MCG/2ML IJ SOLN
100.0000 ug | INTRAMUSCULAR | Status: DC | PRN
  Administered 2017-09-21 (×2): 100 ug via INTRAVENOUS
  Filled 2017-09-21 (×2): qty 2

## 2017-09-21 MED ORDER — DIBUCAINE 1 % RE OINT: 1.0000 "application " | TOPICAL_OINTMENT | RECTAL | Status: DC | PRN

## 2017-09-21 MED ORDER — ACETAMINOPHEN 325 MG PO TABS
650.0000 mg | ORAL_TABLET | ORAL | Status: DC | PRN
  Filled 2017-09-21: qty 2

## 2017-09-21 MED ORDER — LACTATED RINGERS IV SOLN: 500.0000 mL | INTRAVENOUS | Status: DC | PRN

## 2017-09-21 MED ORDER — PHENYLEPHRINE 40 MCG/ML (10ML) SYRINGE FOR IV PUSH (FOR BLOOD PRESSURE SUPPORT)
80.0000 ug | PREFILLED_SYRINGE | INTRAVENOUS | Status: DC | PRN
  Filled 2017-09-21: qty 5

## 2017-09-21 MED ORDER — OXYTOCIN 40 UNITS IN LACTATED RINGERS INFUSION - SIMPLE MED
2.5000 [IU]/h | INTRAVENOUS | Status: DC
  Filled 2017-09-21: qty 1000

## 2017-09-21 MED ORDER — ONDANSETRON HCL 4 MG/2ML IJ SOLN: 4.0000 mg | Freq: Four times a day (QID) | INTRAMUSCULAR | Status: DC | PRN

## 2017-09-21 MED ORDER — LIDOCAINE HCL (PF) 1 % IJ SOLN
30.0000 mL | INTRAMUSCULAR | Status: DC | PRN
  Filled 2017-09-21: qty 30

## 2017-09-21 MED ORDER — DIPHENHYDRAMINE HCL 50 MG/ML IJ SOLN: 12.5000 mg | INTRAMUSCULAR | Status: DC | PRN

## 2017-09-21 MED ORDER — OXYTOCIN 40 UNITS IN LACTATED RINGERS INFUSION - SIMPLE MED
1.0000 m[IU]/min | INTRAVENOUS | Status: DC
  Administered 2017-09-21: 4 m[IU]/min via INTRAVENOUS
  Administered 2017-09-21: 6 m[IU]/min via INTRAVENOUS

## 2017-09-21 MED ORDER — LACTATED RINGERS IV SOLN: 500.0000 mL | Freq: Once | INTRAVENOUS | Status: DC

## 2017-09-21 MED ORDER — DIPHENHYDRAMINE HCL 25 MG PO CAPS: 25.0000 mg | ORAL_CAPSULE | Freq: Four times a day (QID) | ORAL | Status: DC | PRN

## 2017-09-21 MED ORDER — SOD CITRATE-CITRIC ACID 500-334 MG/5ML PO SOLN: 30.0000 mL | ORAL | Status: DC | PRN

## 2017-09-21 MED ORDER — TETANUS-DIPHTH-ACELL PERTUSSIS 5-2.5-18.5 LF-MCG/0.5 IM SUSP: 0.5000 mL | Freq: Once | INTRAMUSCULAR | Status: DC

## 2017-09-21 MED ORDER — ZOLPIDEM TARTRATE 5 MG PO TABS: 5.0000 mg | ORAL_TABLET | Freq: Every evening | ORAL | Status: DC | PRN

## 2017-09-21 MED ORDER — BENZOCAINE-MENTHOL 20-0.5 % EX AERO: 1.0000 "application " | INHALATION_SPRAY | CUTANEOUS | Status: DC | PRN

## 2017-09-21 NOTE — Anesthesia Pain Management Evaluation Note (Signed)
  CRNA Pain Management Visit Note  Patient: Cassandra [name], 37 y.o., female  "Hello I am a member of the anesthesia team at [facility]. We have an anesthesia team available at all times to provide care throughout the hospital, including epidural management and anesthesia for C-section. I don't know your plan for the delivery whether it a natural birth, water birth, IV sedation, nitrous supplementation, doula or epidural, but we want to meet your pain goals."   1.Was your pain managed to your expectations on prior hospitalizations?   Yes   2.What is your expectation for pain management during this hospitalization?     IV pain meds  3.How can we help you reach that goal? Support prn  Record the patient's initial score and the patient's pain goal.   Pain: 2  Pain Goal: 5 [facility] wants you to be able to say your pain was always managed very well.  [facility] 09/21/2017

## 2017-09-21 NOTE — Progress Notes (Signed)
Labor Progress Note TEEA DUCEY is a 37 y.o. [id number] at [redacted]w[redacted]d presented for elective IOL  S:  Comfortable, no c/o.   O:  BP 130/76 (BP Location: Left Arm)   Pulse (!) 109   Temp (!) 97.5 F (36.4 C) (Oral)   Resp 16   Ht 5\' 3"  (1.6 m)   Wt 218 lb (98.9 kg)   LMP 12/15/2016 Comment: ? SAB in Sept, +PT in Oct  BMI 38.62 kg/m  EFM: baseline 150 bpm/ mod variability/ + accels/ no decels  Toco: rare SVE: Dilation: 5.5 Effacement (%): 70 Cervical Position: Posterior, Middle Station: -2 Presentation: Vertex Exam by:: Libbey Duce cnm   A/P: 37 y.o. [id number] [redacted]w[redacted]d  1. Labor: latent 2. FWB: Cat I 3. Pain: analgesia/anesthesia/NO  Will start Pitocin, pt agrees with plan. Anticipate SVD.  Julianne Handler, CNM 12:22 PM

## 2017-09-21 NOTE — Progress Notes (Signed)
Labor Progress Note Cassandra [name] is a 37 y.o. [id number] at [redacted]w[redacted]d presented for elective IOL  S: Patient with significant discomfort with contractions, receiving IV pain meds. Still wants to hold off on epidural for now.  O:  BP 126/65   Pulse 84   Temp 98.3 F (36.8 C) (Oral)   Resp 16   Ht 5\' 3"  (1.6 m)   Wt 218 lb (98.9 kg)   LMP 12/15/2016 Comment: ? SAB in Sept, +PT in Oct  BMI 38.62 kg/m   OVP:CHEKBTCY rate 130, moderate variability, + acels, early and few variable decels Toco: ctx every 1-3 min  CVE: Dilation: 6.5 Effacement (%): 80 Cervical Position: Middle Station: -1 Presentation: Vertex Exam by:: D Herr rn  A&P: 37 y.o. [id number] [redacted]w[redacted]d here for elective IOL. Labor: Progressing well. Continue titrating pitocin. Anticipate SVD. Pain: Per patient request FWB: Cat II, continue to monitor  Rory Percy, DO 4:31 PM

## 2017-09-21 NOTE — H&P (Addendum)
OBSTETRIC ADMISSION HISTORY AND PHYSICAL  Cassandra [name] is a 37 y.o. female [phone] with IUP at [redacted]w[redacted]d by LMP presenting for elective IOL at [facility]. She reports +FMs, No LOF, no VB, no blurry vision, headaches or peripheral edema, and RUQ pain.  She plans on bottle feeding. She is undecided for birth control. She received her prenatal care at [facility]   Dating: By 7wk [place] c/w LMP --->  Estimated Date of Delivery: 09/21/17  Sono:   @[redacted]w[redacted]d , CWD, normal anatomy, cephalic presentation, 6433I, 46% EFW. AFI normal. Anterior placenta.  Prenatal History/Complications: Obesity - BMI 61 AMA Hypertriglyceridemia  Past Medical History: Past Medical History:  Diagnosis Date  . Anxiety   . Arrhythmia   . Frequent headaches   . Genital warts   . GERD (gastroesophageal reflux disease)   . H/O chlamydia infection   . H/O hematuria   . Heart palpitations   . Nocturnal headaches     Past Surgical History: Past Surgical History:  Procedure Laterality Date  . DILATION AND CURETTAGE OF UTERUS      Obstetrical History: OB History    Gravida  8   Para  3   Term  3   Preterm  0   AB  4   Living  3     SAB  3   TAB  1   Ectopic  0   Multiple  0   Live Births  3        Obstetric Comments  SABs all around 3 months        Social History: Social History   Socioeconomic History  . Marital status: Married    Spouse name: Jamel  . Number of children: 3  . Years of education: 28  . Highest education level: Not on file  Occupational History  . Not on file  Social Needs  . Financial resource strain: Not on file  . Food insecurity:    Worry: Not on file    Inability: Not on file  . Transportation needs:    Medical: Not on file    Non-medical: Not on file  Tobacco Use  . Smoking status: Never Smoker  . Smokeless tobacco: Never Used  Substance and Sexual Activity  . Alcohol use: No    Alcohol/week: 0.0 oz    Frequency: Never  . Drug use: No    Comment: former (quit  2007)  . Sexual activity: Yes    Partners: Male    Birth control/protection: Condom, None  Lifestyle  . Physical activity:    Days per week: Not on file    Minutes per session: Not on file  . Stress: Not on file  Relationships  . Social connections:    Talks on phone: Not on file    Gets together: Not on file    Attends religious service: Not on file    Active member of club or organization: Not on file    Attends meetings of clubs or organizations: Not on file    Relationship status: Not on file  Other Topics Concern  . Not on file  Social History Narrative   Epworth Sleepiness Scale = 2 (01/13/2015)Lives with husband and 3 children   Caffeine use: 1-2 coffee per week.     Family History: Family History  Problem Relation Age of Onset  . Hyperlipidemia Mother   . Hypertension Mother   . Drug abuse Father   . Arthritis Maternal Grandmother   . Hyperlipidemia Maternal Grandmother   .  Heart disease Maternal Grandmother   . Hypertension Maternal Grandmother   . Diabetes Maternal Grandmother   . Heart disease Maternal Grandfather   . Hyperlipidemia Sister   . Heart disease Paternal Grandfather   . Other Neg Hx        pco  . Migraines Neg Hx     Allergies: No Known Allergies  Medications Prior to Admission  Medication Sig Dispense Refill Last Dose  . Prenatal Vit-Fe Fumarate-FA (PRENATAL MULTIVITAMIN) TABS tablet Take 1 tablet by mouth daily at 12 noon.   09/18/2017 at Unknown time     Review of Systems   All systems reviewed and negative except as stated in HPI  Last menstrual period 12/15/2016. General appearance: alert, cooperative, appears stated age and no distress Lungs: clear to auscultation bilaterally Heart: regular rate and rhythm Abdomen: soft, non-tender; bowel sounds normal Extremities: Homans sign is negative, no sign of DVT Presentation: cephalic Fetal monitoringBaseline: 150 bpm, Variability: Good {> 6 bpm), Accelerations: Reactive and  Decelerations: Absent Uterine activity irregular   Prenatal labs: ABO, Rh: A/Positive/-- (01/11 1657) Antibody: Negative (01/11 1657) Rubella: 1.55 (01/11 1657) RPR: Non Reactive (04/22 0928)  HBsAg: Negative (01/11 1657)  HIV: Non Reactive (04/22 0928)  GBS: Negative (06/18 0000)  2 hr Glucola 86, 147, 97 Genetic screening  AFP, Quad wnl Anatomy US @ [redacted]w[redacted]d, normal anatomy, female fetus.  Prenatal Transfer Tool  Maternal Diabetes: No Genetic Screening: Normal Maternal Ultrasounds/Referrals: Normal Fetal Ultrasounds or other Referrals:  None Maternal Substance Abuse:  No Significant Maternal Medications:  None Significant Maternal Lab Results: Lab values include: Group B Strep negative  No results found for this or any previous visit (from the past 24 hour(s)).  Patient Active Problem List   Diagnosis Date Noted  . Encounter for elective induction of labor 09/21/2017  . Obesity (BMI 30-39.9) 03/28/2017  . Obesity in pregnancy 03/28/2017  . Supervision of low-risk pregnancy 03/22/2017  . AMA (advanced maternal age) multigravida 35+ 03/22/2017  . Constipation 05/19/2015  . IBS (irritable bowel syndrome) 05/19/2015  . Loss of weight 09/21/2014  . Hypertriglyceridemia 05/15/2013  . Heart palpitations 04/15/2013    Assessment/Plan:  Cassandra [name] is a 37 y.o. [id number] at [redacted]w[redacted]d here for elective IOL at [facility].  #Labor: latent #Pain: analgesia/anesthesia/NO #FWB: Cat I #ID:GBs neg  #MOF: breast #MOC: undecided #Circ: n/a    Rory Percy, DO  09/21/2017, 11:12 AM  Midwife attestation: I have seen and examined this patient; I agree with above documentation in the resident's note.   Cassandra [name] is a 37 y.o. [phone] here for elective IOL  PE: Gen: calm comfortable, NAD Resp: normal effort, no distress Abd: gravid  ROS, labs, PMH reviewed  Assessment/Plan: Admit to LD Labor: latent FWB: Cat I ID: GBS neg Discussed increased risk of complications (including  CS) with elective IOL, pt accepts risk Cervix favorable, will start [place], CNM  09/21/2017, 1:13 PM

## 2017-09-22 LAB — RPR: RPR: NONREACTIVE

## 2017-09-22 NOTE — Progress Notes (Signed)
CSW received consult for MOB due to history of anxiety. CSW met with MOB and family members to discuss history. CSW obtained permission from MOB to discuss reason for consult with family present. MOB reports being diagnosis with anxiety four years ago when she was seen at [facility] ED in 2015 for toenail discoloration, fatigue, and a panic attack. MOB reports being asymptomatic for over three years. MOB reports a great mood and great support system to rely on. MOB did not have questions for CSW. CSW encouraged MOB to reach out for assistance if needs arise, MOB in agreement.  Madilyn Fireman, MSW, [place] Social Worker [facility] [phone]

## 2017-09-22 NOTE — Progress Notes (Signed)
Post Partum Day 1 Subjective: no complaints, up ad lib, voiding and tolerating PO  Objective: Blood pressure (!) 108/55, pulse 60, temperature 97.8 F (36.6 C), resp. rate 18, height 5\' 3"  (1.6 m), weight 218 lb (98.9 kg), last menstrual period 12/15/2016, unknown if currently breastfeeding.  Physical Exam:  General: alert, cooperative and no distress Lochia: appropriate Uterine Fundus: firm Incision: N/A DVT Evaluation: No evidence of DVT seen on physical exam. No cords or calf tenderness.  Recent Labs    09/21/17 1054  HGB 11.3*  HCT 33.2*    Assessment/Plan: Plan for discharge tomorrow   LOS: 1 day   Luiz Blare, DO 09/22/2017, 7:14 AM

## 2017-09-22 NOTE — Progress Notes (Signed)
Saline lock discontinued.

## 2017-09-23 LAB — URINE CULTURE: Culture: NO GROWTH

## 2017-09-23 MED ORDER — IBUPROFEN 600 MG PO TABS: 600.0000 mg | ORAL_TABLET | Freq: Four times a day (QID) | ORAL | 0 refills | Status: DC

## 2017-09-23 NOTE — Discharge Instructions (Signed)
Vaginal Delivery, Care After °Refer to this sheet in the next few weeks. These instructions provide you with information about caring for yourself after vaginal delivery. Your health care provider may also give you more specific instructions. Your treatment has been planned according to current medical practices, but problems sometimes occur. Call your health care provider if you have any problems or questions. °What can I expect after the procedure? °After vaginal delivery, it is common to have: °· Some bleeding from your vagina. °· Soreness in your abdomen, your vagina, and the area of skin between your vaginal opening and your anus (perineum). °· Pelvic cramps. °· Fatigue. ° °Follow these instructions at home: °Medicines °· Take over-the-counter and prescription medicines only as told by your health care provider. °· If you were prescribed an antibiotic medicine, take it as told by your health care provider. Do not stop taking the antibiotic until it is finished. °Driving ° °· Do not drive or operate heavy machinery while taking prescription pain medicine. °· Do not drive for 24 hours if you received a sedative. °Lifestyle °· Do not drink alcohol. This is especially important if you are breastfeeding or taking medicine to relieve pain. °· Do not use tobacco products, including cigarettes, chewing tobacco, or e-cigarettes. If you need help quitting, ask your health care provider. °Eating and drinking °· Drink at least 8 eight-ounce glasses of water every day unless you are told not to by your health care provider. If you choose to breastfeed your baby, you may need to drink more water than this. °· Eat high-fiber foods every day. These foods may help prevent or relieve constipation. High-fiber foods include: °? Whole grain cereals and breads. °? Brown rice. °? Beans. °? Fresh fruits and vegetables. °Activity °· Return to your normal activities as told by your health care provider. Ask your health care provider  what activities are safe for you. °· Rest as much as possible. Try to rest or take a nap when your baby is sleeping. °· Do not lift anything that is heavier than your baby or 10 lb (4.5 kg) until your health care provider says that it is safe. °· Talk with your health care provider about when you can engage in sexual activity. This may depend on your: °? Risk of infection. °? Rate of healing. °? Comfort and desire to engage in sexual activity. °Vaginal Care °· If you have an episiotomy or a vaginal tear, check the area every day for signs of infection. Check for: °? More redness, swelling, or pain. °? More fluid or blood. °? Warmth. °? Pus or a bad smell. °· Do not use tampons or douches until your health care provider says this is safe. °· Watch for any blood clots that may pass from your vagina. These may look like clumps of dark red, brown, or black discharge. °General instructions °· Keep your perineum clean and dry as told by your health care provider. °· Wear loose, comfortable clothing. °· Wipe from front to back when you use the toilet. °· Ask your health care provider if you can shower or take a bath. If you had an episiotomy or a perineal tear during labor and delivery, your health care provider may tell you not to take baths for a certain length of time. °· Wear a bra that supports your breasts and fits you well. °· If possible, have someone help you with household activities and help care for your baby for at least a few days after   you leave the hospital. °· Keep all follow-up visits for you and your baby as told by your health care provider. This is important. °Contact a health care provider if: °· You have: °? Vaginal discharge that has a bad smell. °? Difficulty urinating. °? Pain when urinating. °? A sudden increase or decrease in the frequency of your bowel movements. °? More redness, swelling, or pain around your episiotomy or vaginal tear. °? More fluid or blood coming from your episiotomy or  vaginal tear. °? Pus or a bad smell coming from your episiotomy or vaginal tear. °? A fever. °? A rash. °? Little or no interest in activities you used to enjoy. °? Questions about caring for yourself or your baby. °· Your episiotomy or vaginal tear feels warm to the touch. °· Your episiotomy or vaginal tear is separating or does not appear to be healing. °· Your breasts are painful, hard, or turn red. °· You feel unusually sad or worried. °· You feel nauseous or you vomit. °· You pass large blood clots from your vagina. If you pass a blood clot from your vagina, save it to show to your health care provider. Do not flush blood clots down the toilet without having your health care provider look at them. °· You urinate more than usual. °· You are dizzy or light-headed. °· You have not breastfed at all and you have not had a menstrual period for 12 weeks after delivery. °· You have stopped breastfeeding and you have not had a menstrual period for 12 weeks after you stopped breastfeeding. °Get help right away if: °· You have: °? Pain that does not go away or does not get better with medicine. °? Chest pain. °? Difficulty breathing. °? Blurred vision or spots in your vision. °? Thoughts about hurting yourself or your baby. °· You develop pain in your abdomen or in one of your legs. °· You develop a severe headache. °· You faint. °· You bleed from your vagina so much that you fill two sanitary pads in one hour. °This information is not intended to replace advice given to you by your health care provider. Make sure you discuss any questions you have with your health care provider. °Document Released: 02/24/2000 Document Revised: 08/10/2015 Document Reviewed: 03/13/2015 °Elsevier Interactive Patient Education © 2018 [employer] ° °

## 2017-09-23 NOTE — Discharge Summary (Addendum)
OB Discharge Summary     Patient Name: Cassandra [name] DOB: January 08, 1981 MRN: [id number]  Date of admission: 09/21/2017 Delivering MD: Rory Percy   Date of discharge: 09/23/2017  Admitting diagnosis: induction Intrauterine pregnancy: [redacted]w[redacted]d     Secondary diagnosis:  Active Problems:   Encounter for elective induction of labor  Additional problems: advanced maternal age.  Small leukocytes seen in urinalysis. Urine Cx still pending at discharge. Will need to followup with provider regarding results.      Discharge diagnosis: Term Pregnancy Delivered                                                                                                Post partum procedures:none  Augmentation: Pitocin  Complications: None  Hospital course:  Induction of Labor With Vaginal Delivery   37 y.o. yo [id number] at [redacted]w[redacted]d was admitted to the hospital 09/21/2017 for induction of labor.  Indication for induction: AMA and elective.  Patient had an uncomplicated labor course as follows: Membrane Rupture Time/Date: 3:00 PM ,09/21/2017   Intrapartum Procedures: Episiotomy: None [1]                                         Lacerations:  None [1]  Patient had delivery of a Viable infant.  Information for the patient's newborn:  Siobhan, Zaro [[id number]]  Delivery Method: Vag-Spont   09/21/2017  Details of delivery can be found in separate delivery note.  Patient had a routine postpartum course. Patient is discharged home 09/23/17.  Physical exam  Vitals:   09/22/17 0500 09/22/17 1351 09/22/17 2152 09/23/17 0643  BP: (!) 108/55 109/60 124/69 97/67  Pulse: 60 66 78 61  Resp: 18 18 18 18   Temp: 97.8 F (36.6 C) 97.7 F (36.5 C) 98 F (36.7 C) 97.7 F (36.5 C)  TempSrc:  Oral Oral Oral  Weight:      Height:       General: alert, cooperative and no distress Lochia: appropriate Uterine Fundus: firm Incision: none  DVT Evaluation: No evidence of DVT seen on physical exam. Labs: Lab Results   Component Value Date   WBC 11.0 (H) 09/21/2017   HGB 11.3 (L) 09/21/2017   HCT 33.2 (L) 09/21/2017   MCV 87.8 09/21/2017   PLT 278 09/21/2017   CMP Latest Ref Rng & Units 12/07/2015  Glucose 70 - 99 mg/dL 83  BUN 6 - 23 mg/dL 14  Creatinine 0.40 - 1.20 mg/dL 0.63  Sodium 135 - 145 mEq/L 138  Potassium 3.5 - 5.1 mEq/L 4.1  Chloride 96 - 112 mEq/L 103  CO2 19 - 32 mEq/L 30  Calcium 8.4 - 10.5 mg/dL 9.4  Total Protein 6.0 - 8.3 g/dL 7.6  Total Bilirubin 0.2 - 1.2 mg/dL 0.5  Alkaline Phos 39 - 117 U/L 68  AST 0 - 37 U/L 13  ALT 0 - 35 U/L 14    Discharge instruction: per After Visit Summary and "Baby and Me Booklet".  After visit meds:  Allergies as of 09/23/2017   No Known Allergies     Medication List    TAKE these medications   ibuprofen 600 MG tablet Commonly known as:  ADVIL,MOTRIN Take 1 tablet (600 mg total) by mouth every 6 (six) hours.       Diet: routine diet  Activity: Advance as tolerated. Pelvic rest for 6 weeks.   Outpatient follow up:6 weeks Follow up Appt:No future appointments. Follow up Visit:No follow-ups on file.  Postpartum contraception: Condoms  Newborn Data: Live born female  Birth Weight: 8 lb (3629 g) APGAR: 73, 80  Newborn Delivery   Birth date/time:  09/21/2017 17:12:00 Delivery type:  Vaginal, Spontaneous     Baby Feeding: Bottle Disposition:home with mother   09/23/2017 Benay Pike, MD

## 2017-10-24 ENCOUNTER — Encounter: Payer: Self-pay | Admitting: Obstetrics and Gynecology

## 2017-10-24 ENCOUNTER — Ambulatory Visit (INDEPENDENT_AMBULATORY_CARE_PROVIDER_SITE_OTHER): Payer: Self-pay | Admitting: Obstetrics and Gynecology

## 2017-10-24 NOTE — Patient Instructions (Signed)
 Contraception Choices Contraception, also called birth control, refers to methods or devices that prevent pregnancy. Hormonal methods Contraceptive implant A contraceptive implant is a thin, plastic tube that contains a hormone. It is inserted into the upper part of the arm. It can remain in place for up to 3 years. Progestin-only injections Progestin-only injections are injections of progestin, a synthetic form of the hormone progesterone. They are given every 3 months by a health care provider. Birth control pills Birth control pills are pills that contain hormones that prevent pregnancy. They must be taken once a day, preferably at the same time each day. Birth control patch The birth control patch contains hormones that prevent pregnancy. It is placed on the skin and must be changed once a week for three weeks and removed on the fourth week. A prescription is needed to use this method of contraception. Vaginal ring A vaginal ring contains hormones that prevent pregnancy. It is placed in the vagina for three weeks and removed on the fourth week. After that, the process is repeated with a new ring. A prescription is needed to use this method of contraception. Emergency contraceptive Emergency contraceptives prevent pregnancy after unprotected sex. They come in pill form and can be taken up to 5 days after sex. They work best the sooner they are taken after having sex. Most emergency contraceptives are available without a prescription. This method should not be used as your only form of birth control. Barrier methods Female condom A female condom is a thin sheath that is worn over the penis during sex. Condoms keep sperm from going inside a woman's body. They can be used with a spermicide to increase their effectiveness. They should be disposed after a single use. Female condom A female condom is a soft, loose-fitting sheath that is put into the vagina before sex. The condom keeps sperm from  going inside a woman's body. They should be disposed after a single use. Diaphragm A diaphragm is a soft, dome-shaped barrier. It is inserted into the vagina before sex, along with a spermicide. The diaphragm blocks sperm from entering the uterus, and the spermicide kills sperm. A diaphragm should be left in the vagina for 6-8 hours after sex and removed within 24 hours. A diaphragm is prescribed and fitted by a health care provider. A diaphragm should be replaced every 1-2 years, after giving birth, after gaining more than 15 lb (6.8 kg), and after pelvic surgery. Cervical cap A cervical cap is a round, soft latex or plastic cup that fits over the cervix. It is inserted into the vagina before sex, along with spermicide. It blocks sperm from entering the uterus. The cap should be left in place for 6-8 hours after sex and removed within 48 hours. A cervical cap must be prescribed and fitted by a health care provider. It should be replaced every 2 years. Sponge A sponge is a soft, circular piece of polyurethane foam with spermicide on it. The sponge helps block sperm from entering the uterus, and the spermicide kills sperm. To use it, you make it wet and then insert it into the vagina. It should be inserted before sex, left in for at least 6 hours after sex, and removed and thrown away within 30 hours. Spermicides Spermicides are chemicals that kill or block sperm from entering the cervix and uterus. They can come as a cream, jelly, suppository, foam, or tablet. A spermicide should be inserted into the vagina with an applicator at least 10-15 minutes   sex to allow time for it to work. The process must be repeated every time you have sex. Spermicides do not require a prescription. Intrauterine contraception Intrauterine device (IUD) An IUD is a T-shaped device that is put in a woman's uterus. There are two types:  Hormone IUD.This type contains progestin, a synthetic form of the hormone progesterone. This  type can stay in place for 3-5 years.  Copper IUD.This type is wrapped in copper wire. It can stay in place for 10 years.  Permanent methods of contraception Female tubal ligation In this method, a woman's fallopian tubes are sealed, tied, or blocked during surgery to prevent eggs from traveling to the uterus. Hysteroscopic sterilization In this method, a small, flexible insert is placed into each fallopian tube. The inserts cause scar tissue to form in the fallopian tubes and block them, so sperm cannot reach an egg. The procedure takes about 3 months to be effective. Another form of birth control must be used during those 3 months. Female sterilization This is a procedure to tie off the tubes that carry sperm (vasectomy). After the procedure, the man can still ejaculate fluid (semen). Natural planning methods Natural family planning In this method, a couple does not have sex on days when the woman could become pregnant. Calendar method This means keeping track of the length of each menstrual cycle, identifying the days when pregnancy can happen, and not having sex on those days. Ovulation method In this method, a couple avoids sex during ovulation. Symptothermal method This method involves not having sex during ovulation. The woman typically checks for ovulation by watching changes in her temperature and in the consistency of cervical mucus. Post-ovulation method In this method, a couple waits to have sex until after ovulation. Summary  Contraception, also called birth control, means methods or devices that prevent pregnancy.  Hormonal methods of contraception include implants, injections, pills, patches, vaginal rings, and emergency contraceptives.  Barrier methods of contraception can include female condoms, female condoms, diaphragms, cervical caps, sponges, and spermicides.  There are two types of IUDs (intrauterine devices). An IUD can be put in a woman's uterus to prevent pregnancy  for 3-5 years.  Permanent sterilization can be done through a procedure for males, females, or both.  Natural family planning methods involve not having sex on days when the woman could become pregnant. This information is not intended to replace advice given to you by your health care provider. Make sure you discuss any questions you have with your health care provider. Document Released: 02/26/2005 Document Revised: 03/31/2016 Document Reviewed: 03/31/2016 Elsevier Interactive Patient Education  2018 [employer].

## 2017-10-24 NOTE — Progress Notes (Signed)
Subjective:     Cassandra [name] is a 37 y.o. female who presents for a postpartum visit. She is 5 weeks postpartum following a spontaneous vaginal delivery. I have fully reviewed the prenatal and intrapartum course. The delivery was at 40.0 gestational weeks. Outcome: spontaneous vaginal delivery. Anesthesia: epidural. Postpartum course has been unremarlable. Baby's course has been unremarkable. Baby is feeding by bottle - Similac with Iron and Fawn Kirk. Bleeding staining only. Bowel function is normal. Bladder function is normal. Patient is not sexually active. Contraception method is none. Postpartum depression screening: negative.  The following portions of the patient's history were reviewed and updated as appropriate: allergies, current medications, past family history, past medical history, past social history, past surgical history and problem list.  Review of Systems Pertinent items are noted in HPI.   Objective:    There were no vitals taken for this visit.  General:  alert, cooperative and no distress   Breasts:  inspection negative, no nipple discharge or bleeding, no masses or nodularity palpable  Lungs: clear to auscultation bilaterally  Heart:  regular rate and rhythm, S1, S2 normal, no murmur, click, rub or gallop  Abdomen: soft, non-tender; bowel sounds normal; no masses,  no organomegaly   Vulva:  not evaluated  Vagina: not evaluated  Cervix:  not evaluated  Corpus: not examined  Adnexa:  not evaluated  Rectal Exam: Not performed.        Assessment:     5 weeks postpartum exam. Pap smear not done at today's visit.   Plan:    1. Contraception: condoms Long discussion about contraceptive options. Encouraged LARC and explained use effectiveness for barrier methods. 2. Healthy diet and exercise advised 3. Follow up in: 1 year or as needed. contraception or other GYN concerns

## 2017-12-25 NOTE — Telephone Encounter (Signed)
error 

## 2018-04-15 LAB — HEPATITIS C ANTIBODY: HCV Ab: NEGATIVE

## 2018-05-07 ENCOUNTER — Encounter: Payer: Self-pay | Admitting: Nurse Practitioner

## 2018-05-07 ENCOUNTER — Ambulatory Visit: Payer: Self-pay | Admitting: Nurse Practitioner

## 2018-05-07 VITALS — BP 112/88 | HR 88 | Temp 98.4°F | Resp 14 | Wt 184.0 lb

## 2018-05-07 DIAGNOSIS — H6991 Unspecified Eustachian tube disorder, right ear: Secondary | ICD-10-CM

## 2018-05-07 DIAGNOSIS — H6981 Other specified disorders of Eustachian tube, right ear: Secondary | ICD-10-CM

## 2018-05-07 MED ORDER — PSEUDOEPHEDRINE HCL 60 MG PO TABS: 60.0000 mg | ORAL_TABLET | Freq: Three times a day (TID) | ORAL | 0 refills | Status: DC | PRN

## 2018-05-07 MED ORDER — CETIRIZINE HCL 10 MG PO TABS: 10.0000 mg | ORAL_TABLET | Freq: Every day | ORAL | 0 refills | Status: DC

## 2018-05-07 MED ORDER — FLUTICASONE PROPIONATE 50 MCG/ACT NA SUSP: 2.0000 | Freq: Every day | NASAL | 0 refills | Status: DC

## 2018-05-07 NOTE — Progress Notes (Deleted)
Subjective:    Patient ID: [name], female    DOB: 11/22/1980, 38 y.o.   MRN: [id number]  The patient is a 38 year old female who presents with complaints of right ear pain for 3 days.  The patient states her symptoms seem to have followed an upper respiratory infection that she is currently fighting.  The patient states she noticed it more so this morning, and feels like there is "fluid" draining in her ear.  The patient also complains of nasal congestion and runny nose.  The patient denies fever, chills, ear drainage, cough, shortness of breath, and headache.  She informs she has been using an over-the-counter eardrop to help her ear pain.  Patient rates her current ear pain 1/10 at present.  Sore Throat   This is a new problem. The current episode started yesterday. The problem has been unchanged. The pain is worse on the right side. Maximum temperature: low-grade temperature. The pain is at a severity of 8/10. Associated symptoms include coughing, headaches, swollen glands and trouble swallowing. Pertinent negatives include no abdominal pain, congestion, diarrhea, ear discharge, ear pain or vomiting. She has had no exposure to strep. Treatments tried: Dayquil and Nyquil. The treatment provided no relief.   I have reviewed the patient's past medical history, current medications and allergies.   Review of Systems  Constitutional: Positive for fatigue. Negative for activity change, appetite change, chills and fever.  HENT: Positive for sore throat and trouble swallowing. Negative for congestion, ear discharge, ear pain, sinus pressure and sinus pain.   Eyes: Negative.   Respiratory: Positive for cough.   Cardiovascular: Negative.   Gastrointestinal: Negative for abdominal pain, diarrhea, nausea and vomiting.  Skin: Negative.   Neurological: Positive for headaches.       Objective: Blood pressure 112/88, pulse 88, temperature 98.4 F (36.9 C), resp. rate 14, weight 184 lb (83.5 kg),  SpO2 98 %, unknown if currently breastfeeding.   Physical Exam Vitals signs reviewed.  HENT:     Head: Normocephalic.     Right Ear: Tympanic membrane, ear canal and external ear normal.     Left Ear: Tympanic membrane, ear canal and external ear normal.     Nose: Nose normal.     Mouth/Throat:     Lips: Pink.     Mouth: Mucous membranes are moist.     Pharynx: Uvula midline. Posterior oropharyngeal erythema present.     Tonsils: Tonsillar exudate present. Swelling: 1+ on the right. 1+ on the left.  Neck:     Musculoskeletal: Normal range of motion.  Cardiovascular:     Rate and Rhythm: Normal rate and regular rhythm.     Pulses: Normal pulses.     Heart sounds: Normal heart sounds.  Pulmonary:     Effort: Pulmonary effort is normal. No respiratory distress.     Breath sounds: Normal breath sounds. No stridor. No wheezing, rhonchi or rales.  Abdominal:     General: Abdomen is flat. Bowel sounds are normal.     Tenderness: There is no abdominal tenderness.  Lymphadenopathy:     Cervical: Cervical adenopathy (bilateral superficial) present.  Skin:    General: Skin is warm and dry.     Capillary Refill: Capillary refill takes less than 2 seconds.     Findings: No rash.  Neurological:     General: No focal deficit present.     Mental Status: She is alert.     Cranial Nerves: No cranial nerve deficit.  Psychiatric:  Mood and Affect: Mood normal.        Thought Content: Thought content normal.           Assessment & Plan:

## 2018-05-07 NOTE — Patient Instructions (Signed)
Eustachian Tube Dysfunction -Take medication as prescribed. -Continue eardrops you are currently using if this helps your ear pain. -May also take Ibuprofen or Tylenol for pain. -Warm compresses to the affected ear for comfort. -Follow up in 72 hours if your symptoms do not improve to have your ear rechecked.  Eustachian tube dysfunction refers to a condition in which a blockage develops in the narrow passage that connects the middle ear to the back of the nose (eustachian tube). The eustachian tube regulates air pressure in the middle ear by letting air move between the ear and nose. It also helps to drain fluid from the middle ear space. Eustachian tube dysfunction can affect one or both ears. When the eustachian tube does not function properly, air pressure, fluid, or both can build up in the middle ear. What are the causes? This condition occurs when the eustachian tube becomes blocked or cannot open normally. Common causes of this condition include:  Ear infections.  Colds and other infections that affect the nose, mouth, and throat (upper respiratory tract).  Allergies.  Irritation from cigarette smoke.  Irritation from stomach acid coming up into the esophagus (gastroesophageal reflux). The esophagus is the tube that carries food from the mouth to the stomach.  Sudden changes in air pressure, such as from descending in an airplane or scuba diving.  Abnormal growths in the nose or throat, such as: ? Growths that line the nose (nasal polyps). ? Abnormal growth of cells (tumors). ? Enlarged tissue at the back of the throat (adenoids). What increases the risk? You are more likely to develop this condition if:  You smoke.  You are overweight.  You are a child who has: ? Certain birth defects of the mouth, such as cleft palate. ? Large tonsils or adenoids. What are the signs or symptoms? Common symptoms of this condition include:  A feeling of fullness in the ear.  Ear  pain.  Clicking or popping noises in the ear.  Ringing in the ear.  Hearing loss.  Loss of balance.  Dizziness. Symptoms may get worse when the air pressure around you changes, such as when you travel to an area of high elevation, fly on an airplane, or go scuba diving. How is this diagnosed? This condition may be diagnosed based on:  Your symptoms.  A physical exam of your ears, nose, and throat.  Tests, such as those that measure: ? The movement of your eardrum (tympanogram). ? Your hearing (audiometry). How is this treated? Treatment depends on the cause and severity of your condition.  In mild cases, you may relieve your symptoms by moving air into your ears. This is called "popping the ears."  In more severe cases, or if you have symptoms of fluid in your ears, treatment may include: ? Medicines to relieve congestion (decongestants). ? Medicines that treat allergies (antihistamines). ? Nasal sprays or ear drops that contain medicines that reduce swelling (steroids). ? A procedure to drain the fluid in your eardrum (myringotomy). In this procedure, a small tube is placed in the eardrum to:  Drain the fluid.  Restore the air in the middle ear space. ? A procedure to insert a balloon device through the nose to inflate the opening of the eustachian tube (balloon dilation). Follow these instructions at home: Lifestyle  Do not do any of the following until your health care provider approves: ? Travel to high altitudes. ? Fly in airplanes. ? Work in a Pension scheme manager or room. ? Scuba dive.  Do not use any products that contain nicotine or tobacco, such as cigarettes and e-cigarettes. If you need help quitting, ask your health care provider.  Keep your ears dry. Wear fitted earplugs during showering and bathing. Dry your ears completely after. General instructions  Take over-the-counter and prescription medicines only as told by your health care provider.  Use  techniques to help pop your ears as recommended by your health care provider. These may include: ? Chewing gum. ? Yawning. ? Frequent, forceful swallowing. ? Closing your mouth, holding your nose closed, and gently blowing as if you are trying to blow air out of your nose.  Keep all follow-up visits as told by your health care provider. This is important. Contact a health care provider if:  Your symptoms do not go away after treatment.  Your symptoms come back after treatment.  You are unable to pop your ears.  You have: ? A fever. ? Pain in your ear. ? Pain in your head or neck. ? Fluid draining from your ear.  Your hearing suddenly changes.  You become very dizzy.  You lose your balance. Summary  Eustachian tube dysfunction refers to a condition in which a blockage develops in the eustachian tube.  It can be caused by ear infections, allergies, inhaled irritants, or abnormal growths in the nose or throat.  Symptoms include ear pain, hearing loss, or ringing in the ears.  Mild cases are treated with maneuvers to unblock the ears, such as yawning or ear popping.  Severe cases are treated with medicines. Surgery may also be done (rare). This information is not intended to replace advice given to you by your health care provider. Make sure you discuss any questions you have with your health care provider. Document Released: 03/25/2015 Document Revised: 06/18/2017 Document Reviewed: 06/18/2017 Elsevier Interactive Patient Education  2019 [employer].

## 2018-05-08 NOTE — Progress Notes (Signed)
Subjective:    Patient ID: Cassandra [name], female    DOB: 1980-08-22, 38 y.o.   MRN: [id number]  The patient is a 38 year old female who presents with complaints of right ear pain for 3 days.  The patient states her symptoms seem to have followed an upper respiratory infection that she is currently fighting.  The patient states she noticed it more so this morning, and feels like there is "fluid" draining in her ear.  The patient also complains of nasal congestion and runny nose.  The patient denies fever, chills, ear drainage, cough, shortness of breath, and headache.  She informs she has been using an over-the-counter eardrop to help her ear pain.  Patient rates her current ear pain 1/10 at present.  Otalgia   There is pain in the right ear. This is a new problem. The current episode started in the past 7 days. There has been no fever. The pain is at a severity of 1/10. Pertinent negatives include no coughing, ear discharge, headaches, hearing loss or sore throat. She has tried ear drops for the symptoms. The treatment provided moderate relief. There is no history of a chronic ear infection or hearing loss.    I have reviewed the patient's past medical history, current medications and allergies.  Review of Systems  Constitutional: Negative.   HENT: Positive for ear pain. Negative for ear discharge, hearing loss, sinus pressure, sinus pain and sore throat.   Eyes: Negative.   Respiratory: Negative.  Negative for cough.   Cardiovascular: Negative.   Neurological: Negative.  Negative for headaches.      Objective: Blood pressure 112/88, pulse 88, temperature 98.4 F (36.9 C), resp. rate 14, weight 184 lb (83.5 kg), SpO2 98 %, not currently breastfeeding.   Physical Exam Vitals signs reviewed.  Constitutional:      General: She is not in acute distress. HENT:     Head: Normocephalic.     Right Ear: Ear canal and external ear normal. A middle ear effusion is present. Tympanic membrane is  not erythematous or bulging.     Left Ear: Ear canal and external ear normal. Tympanic membrane is not erythematous or bulging.     Nose: Mucosal edema and congestion (mild) present.     Right Turbinates: Enlarged and swollen.     Left Turbinates: Enlarged and swollen.     Right Sinus: Maxillary sinus tenderness present. No frontal sinus tenderness.     Left Sinus: Maxillary sinus tenderness present. No frontal sinus tenderness.     Mouth/Throat:     Lips: Pink.     Mouth: Mucous membranes are moist.     Pharynx: Oropharynx is clear. Uvula midline. No posterior oropharyngeal erythema or uvula swelling.     Tonsils: No tonsillar exudate. Swelling: 0 on the right. 0 on the left.  Eyes:     Pupils: Pupils are equal, round, and reactive to light.  Neck:     Musculoskeletal: Normal range of motion and neck supple.  Cardiovascular:     Rate and Rhythm: Normal rate and regular rhythm.     Pulses: Normal pulses.     Heart sounds: Normal heart sounds.  Pulmonary:     Effort: Pulmonary effort is normal.     Breath sounds: Normal breath sounds.  Lymphadenopathy:     Cervical: No cervical adenopathy.  Skin:    General: Skin is warm and dry.     Capillary Refill: Capillary refill takes less than 2 seconds.  Neurological:     General: No focal deficit present.     Mental Status: She is alert and oriented to person, place, and time.     Cranial Nerves: No cranial nerve deficit.  Psychiatric:        Mood and Affect: Mood normal.        Thought Content: Thought content normal.       Assessment & Plan:   Exam findings, diagnosis etiology and medication use and indications reviewed with patient. Follow- Up and discharge instructions provided. No emergent/urgent issues found on exam. Based on the patient's clinical presentation, symptoms and physical assessment, patient's findings were consistent with a right eustachian tube dysfunction.  The patient did not display any symptoms of suppurative  otitis media, has no fever, chills, or other bacterial indications.  I will treat the patient symptomatically with fluticasone and zyrtec.  Patient was instructed that antibiotics were not needed as she does not have signs of infection. Patient education was provided. Patient verbalized understanding of information provided and agrees with plan of care (POC), all questions answered. The patient is advised to call or return to clinic if condition does not see an improvement in symptoms, or to seek the care of the closest emergency department if condition worsens with the above plan.   1. Acute dysfunction of right eustachian tube  - fluticasone (FLONASE) 50 MCG/ACT nasal spray; Place 2 sprays into both nostrils daily for 10 days.  Dispense: 16 g; Refill: 0 - cetirizine (ZYRTEC) 10 MG tablet; Take 1 tablet (10 mg total) by mouth daily for 30 days.  Dispense: 30 tablet; Refill: 0 -Take medication as prescribed. -Continue eardrops you are currently using if this helps your ear pain. -May also take Ibuprofen or Tylenol for pain. -Warm compresses to the affected ear for comfort. -Follow up in 72 hours if your symptoms do not improve to have your ear rechecked.

## 2018-05-09 ENCOUNTER — Telehealth: Payer: Self-pay

## 2018-05-09 NOTE — Telephone Encounter (Signed)
Patient did not answer the phone.

## 2018-09-02 ENCOUNTER — Encounter (HOSPITAL_COMMUNITY): Payer: Self-pay | Admitting: Emergency Medicine

## 2018-09-02 ENCOUNTER — Ambulatory Visit (HOSPITAL_COMMUNITY)
Admission: EM | Admit: 2018-09-02 | Discharge: 2018-09-02 | Disposition: A | Discharge status: Home / Self Care | Payer: BLUE CROSS/BLUE SHIELD | Attending: Family Medicine | Admitting: Family Medicine

## 2018-09-02 ENCOUNTER — Other Ambulatory Visit: Payer: Self-pay

## 2018-09-02 DIAGNOSIS — N946 Dysmenorrhea, unspecified: Secondary | ICD-10-CM | POA: Diagnosis present

## 2018-09-02 LAB — POCT URINALYSIS DIP (DEVICE)
Glucose, UA: NEGATIVE mg/dL
Ketones, ur: NEGATIVE mg/dL
Leukocytes,Ua: NEGATIVE
Nitrite: NEGATIVE
Protein, ur: NEGATIVE mg/dL
Specific Gravity, Urine: 1.025 (ref 1.005–1.030)
Urobilinogen, UA: 1 mg/dL (ref 0.0–1.0)
pH: 6.5 (ref 5.0–8.0)

## 2018-09-02 LAB — POCT PREGNANCY, URINE: Preg Test, Ur: NEGATIVE

## 2018-09-02 NOTE — Discharge Instructions (Addendum)
No infection on the urinalysis today. There was blood which is most likely from the menstrual cycle. I will send this for culture to be sure and we will call you if anything needs to be treated. You can take ibuprofen or Aleve for your back pain Follow up as needed for continued or worsening symptoms

## 2018-09-02 NOTE — ED Triage Notes (Signed)
Pt here for UTI sx

## 2018-09-03 LAB — URINE CULTURE: Culture: 80000 — AB

## 2018-09-03 NOTE — ED Provider Notes (Signed)
[place]    CSN: [id number] Arrival date & time: 09/02/18  1356     History   Chief Complaint Chief Complaint  Patient presents with  . Recurrent UTI    appt 1:50    HPI Cassandra [name] is a 38 y.o. female.   Pt is a 38 year old female that presents with re current UTI.  Symptoms include lower back pain and lower abdominal pain.  She also started her menstrual cycle 3 days ago.  Reports that she typically does not have painful menstrual cycles.  Denies any dysuria, urinary frequency.  Reporting that her UTIs typically give her lower back pain and that is her only symptom.  Symptoms have been constant.  She is not take anything for her symptoms. No fever, chills.   ROS per HPI      Past Medical History:  Diagnosis Date  . Anxiety   . Arrhythmia   . Frequent headaches   . Genital warts   . GERD (gastroesophageal reflux disease)   . H/O chlamydia infection   . H/O hematuria   . Heart palpitations   . Nocturnal headaches     Patient Active Problem List   Diagnosis Date Noted  . Encounter for elective induction of labor 09/21/2017  . Obesity (BMI 30-39.9) 03/28/2017  . Obesity in pregnancy 03/28/2017  . Supervision of low-risk pregnancy 03/22/2017  . AMA (advanced maternal age) multigravida 35+ 03/22/2017  . Constipation 05/19/2015  . IBS (irritable bowel syndrome) 05/19/2015  . Loss of weight 09/21/2014  . Hypertriglyceridemia 05/15/2013  . Heart palpitations 04/15/2013    Past Surgical History:  Procedure Laterality Date  . DILATION AND CURETTAGE OF UTERUS      OB History    Gravida  8   Para  4   Term  4   Preterm  0   AB  4   Living  4     SAB  3   TAB  1   Ectopic  0   Multiple  0   Live Births  4        Obstetric Comments  SABs all around 3 months         Home Medications    Prior to Admission medications   Medication Sig Start Date End Date Taking? Authorizing Provider  cetirizine (ZYRTEC) 10 MG tablet Take  1 tablet (10 mg total) by mouth daily for 30 days. 05/07/18 06/06/18  Kara Dies, NP  fluticasone (FLONASE) 50 MCG/ACT nasal spray Place 2 sprays into both nostrils daily for 10 days. 05/07/18 05/17/18  Kara Dies, NP  ibuprofen (ADVIL,MOTRIN) 600 MG tablet Take 1 tablet (600 mg total) by mouth every 6 (six) hours. Patient not taking: Reported on 10/24/2017 09/23/17   Benay Pike, MD    Family History Family History  Problem Relation Age of Onset  . Hyperlipidemia Mother   . Hypertension Mother   . Drug abuse Father   . Arthritis Maternal Grandmother   . Hyperlipidemia Maternal Grandmother   . Heart disease Maternal Grandmother   . Hypertension Maternal Grandmother   . Diabetes Maternal Grandmother   . Heart disease Maternal Grandfather   . Hyperlipidemia Sister   . Heart disease Paternal Grandfather   . Other Neg Hx        pco  . Migraines Neg Hx     Social History Social History   Tobacco Use  . Smoking status: Never Smoker  . Smokeless tobacco:  Never Used  Substance Use Topics  . Alcohol use: No    Alcohol/week: 0.0 standard drinks    Frequency: Never  . Drug use: No    Comment: former (quit 2007)     Allergies   Patient has no known allergies.   Review of Systems Review of Systems   Physical Exam Triage Vital Signs ED Triage Vitals  Enc Vitals Group     BP 09/02/18 1434 121/63     Pulse Rate 09/02/18 1434 79     Resp 09/02/18 1434 18     Temp 09/02/18 1434 98.9 F (37.2 C)     Temp Source 09/02/18 1434 Oral     SpO2 09/02/18 1434 100 %     Weight --      Height --      Head Circumference --      Peak Flow --      Pain Score 09/02/18 1435 4     Pain Loc --      Pain Edu? --      Excl. in [place]? --    No data found.  Updated Vital Signs BP 121/63 (BP Location: Right Arm)   Pulse 79   Temp 98.9 F (37.2 C) (Oral)   Resp 18   SpO2 100%   Visual Acuity Right Eye Distance:   Left Eye Distance:   Bilateral Distance:     Right Eye Near:   Left Eye Near:    Bilateral Near:     Physical Exam Vitals signs and nursing note reviewed.  Constitutional:      Appearance: Normal appearance.  HENT:     Head: Normocephalic and atraumatic.     Nose: Nose normal.  Eyes:     Conjunctiva/sclera: Conjunctivae normal.  Neck:     Musculoskeletal: Normal range of motion.  Pulmonary:     Effort: Pulmonary effort is normal.  Abdominal:     Palpations: Abdomen is soft.     Tenderness: There is no abdominal tenderness.  Musculoskeletal: Normal range of motion.        General: No tenderness.  Skin:    General: Skin is warm and dry.  Neurological:     Mental Status: She is alert.  Psychiatric:        Mood and Affect: Mood normal.      UC Treatments / Results  Labs (all labs ordered are listed, but only abnormal results are displayed) Labs Reviewed  POCT URINALYSIS DIP (DEVICE) - Abnormal; Notable for the following components:      Result Value   Bilirubin Urine SMALL (*)    Hgb urine dipstick LARGE (*)    All other components within normal limits  URINE CULTURE  POCT PREGNANCY, URINE    EKG None  Radiology No results found.  Procedures Procedures (including critical care time)  Medications Ordered in UC Medications - No data to display  Initial Impression / Assessment and Plan / UC Course  I have reviewed the triage vital signs and the nursing notes.  Pertinent labs & imaging results that were available during my care of the patient were reviewed by me and considered in my medical decision making (see chart for details).     Urine with large blood most likely from menstrual cycle.  No signs of infection.  Sending for culture.  Most likely her pain is coming from her menstrual cycle. Told to monitor symptoms and follow-up as needed  Final Clinical Impressions(s) / UC Diagnoses  Final diagnoses:  Dysmenorrhea     Discharge Instructions     No infection on the urinalysis today.  There was blood which is most likely from the menstrual cycle. I will send this for culture to be sure and we will call you if anything needs to be treated. You can take ibuprofen or Aleve for your back pain Follow up as needed for continued or worsening symptoms     ED Prescriptions    None     Controlled Substance Prescriptions [place] Controlled Substance Registry consulted? Not Applicable   Orvan July, NP 09/03/18 [phone]

## 2020-02-21 IMAGING — US US MFM OB FOLLOW-UP
1 series · 13 of 28 positions shown · non-contrast
Comparison: none

[Series 1: us mfm ob follow-up · 50 acquisitions, 13 frames shown]
[im 2/50]
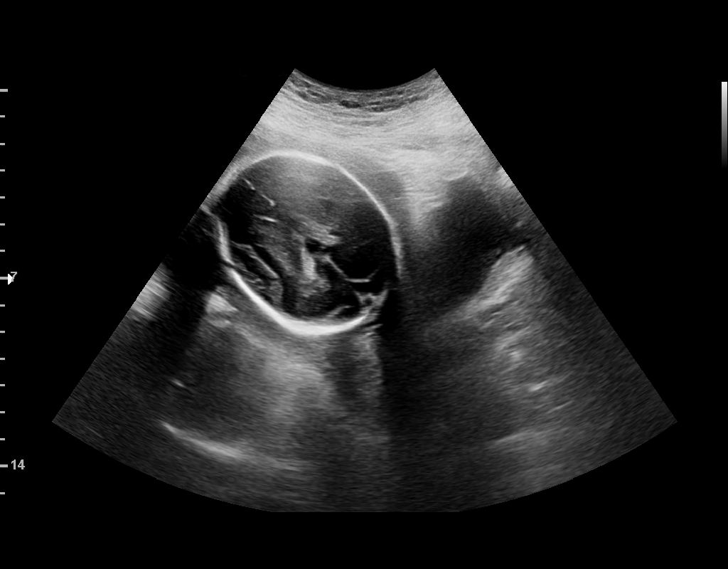
[im 6/50]
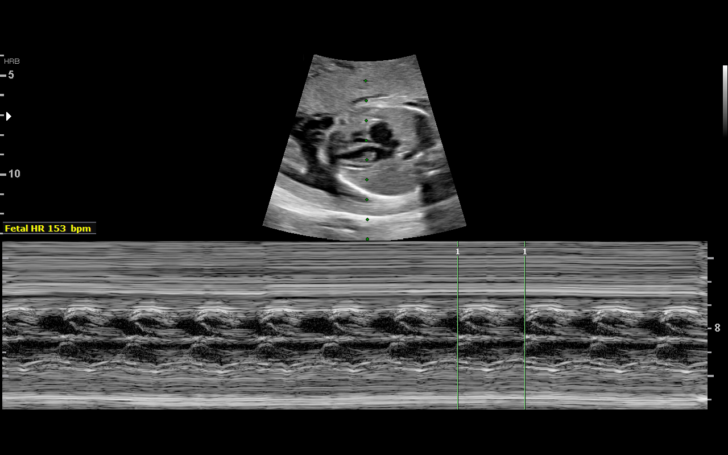
[im 10/50]
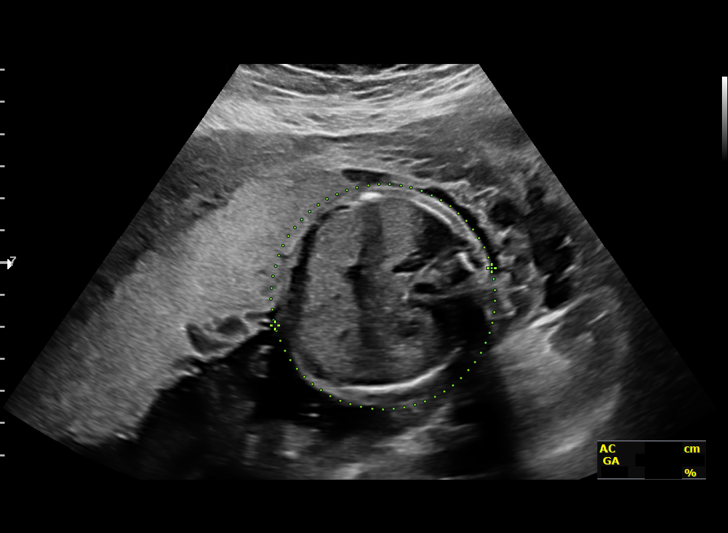
[im 13/50]
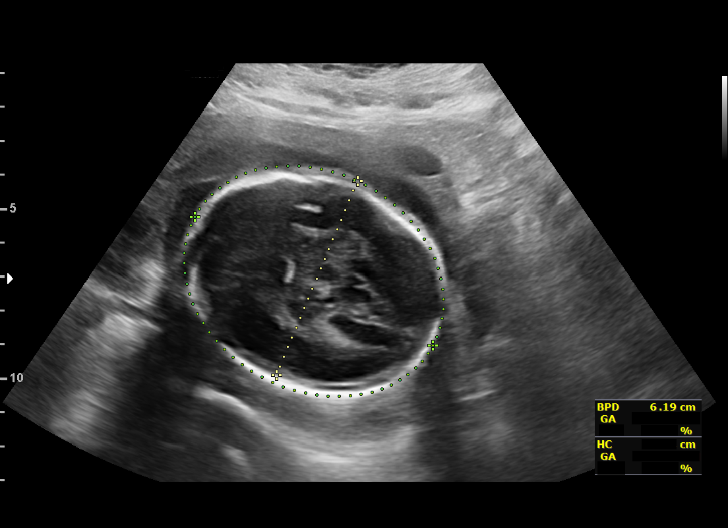
[im 17/50]
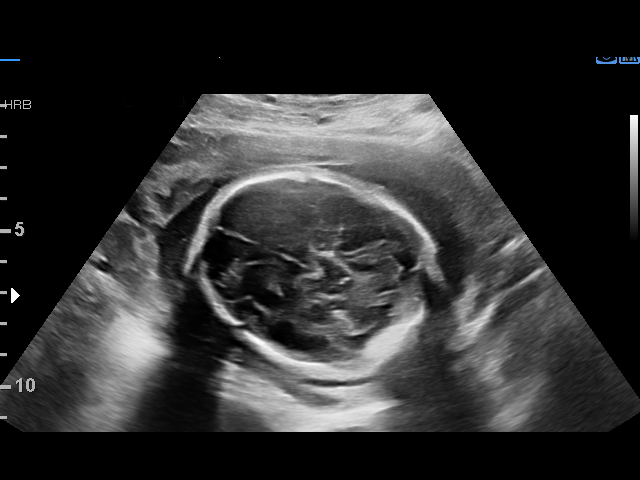
[im 20/50]
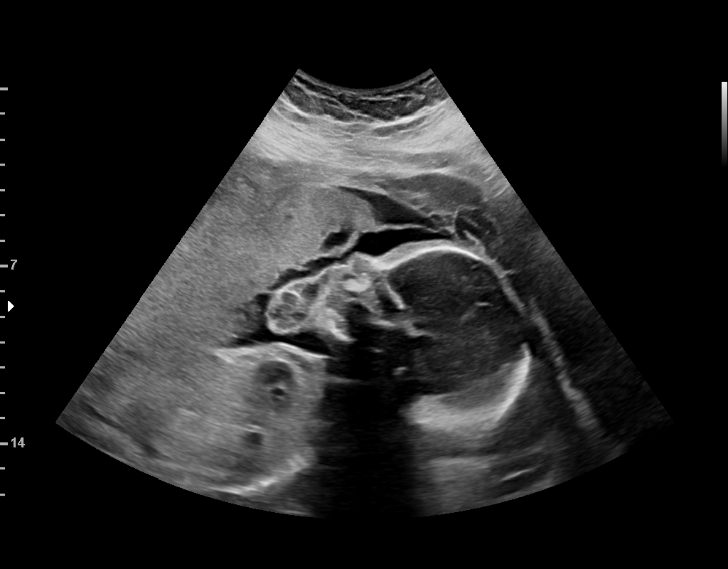
[im 26/50]
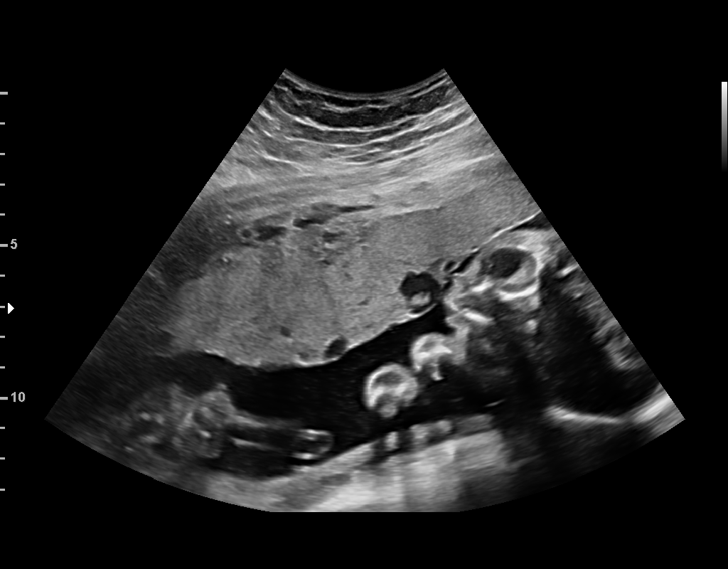
[im 30/50]
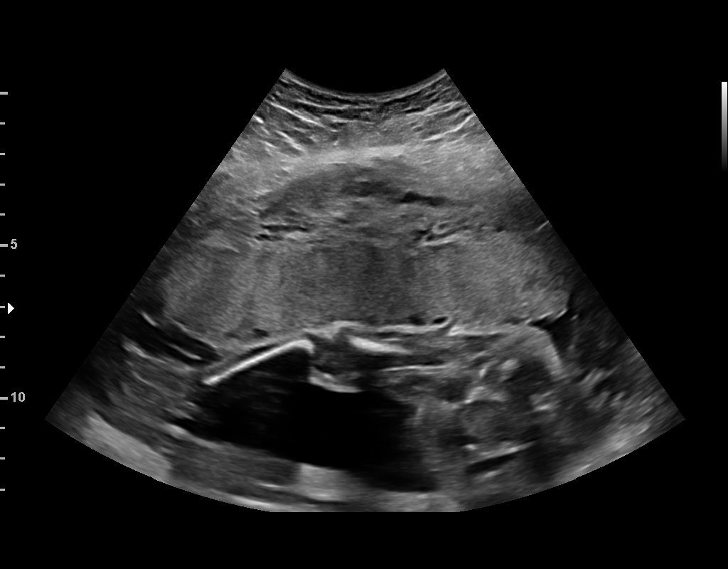
[im 33/50]
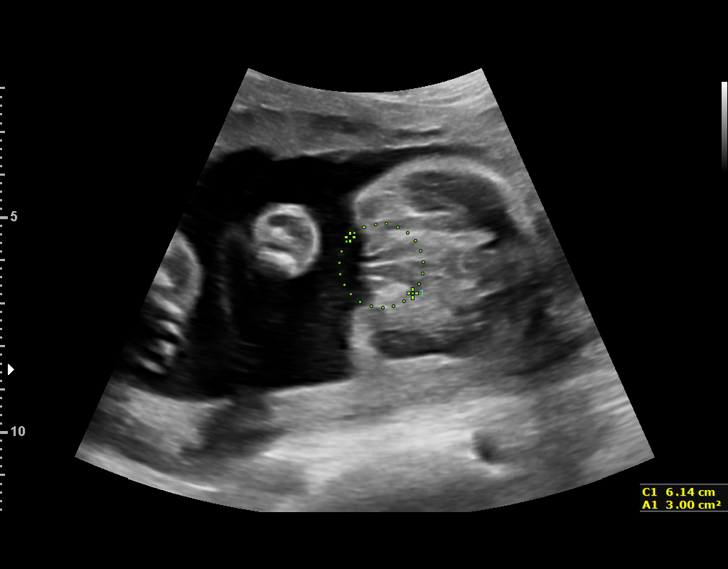
[im 37/50]
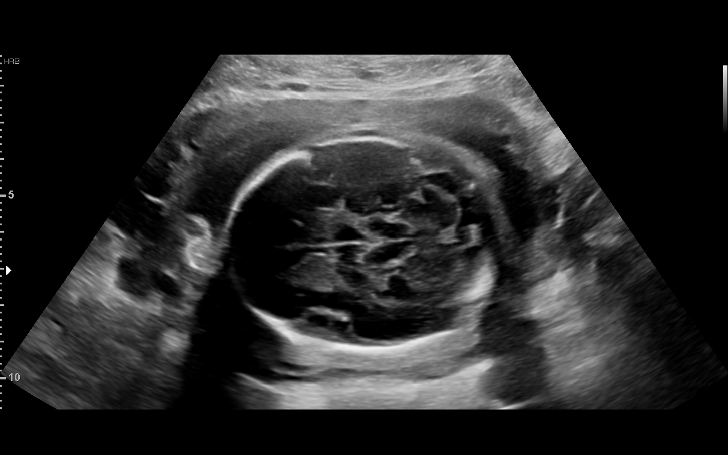
[im 40/50]
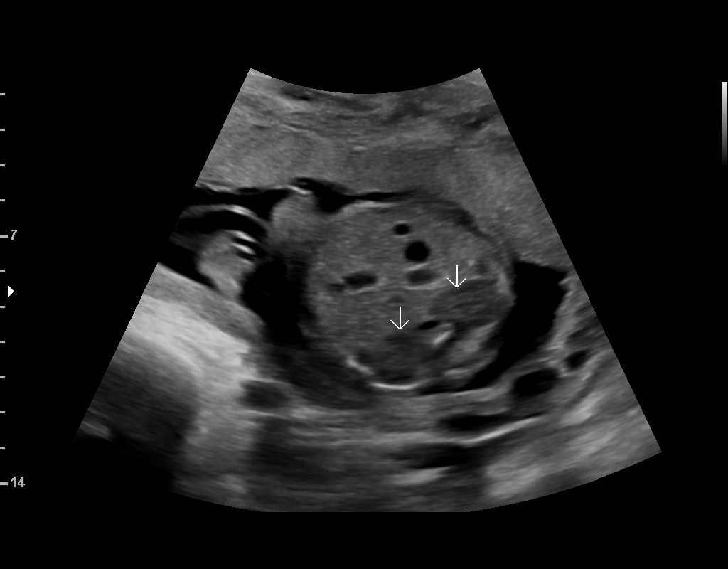
[im 44/50]
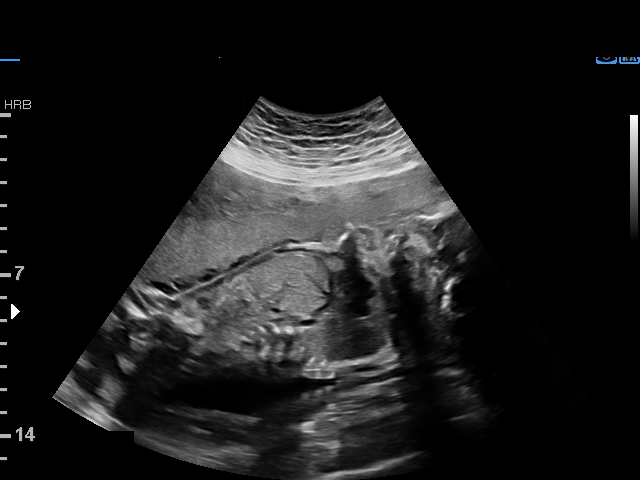
[im 48/50]
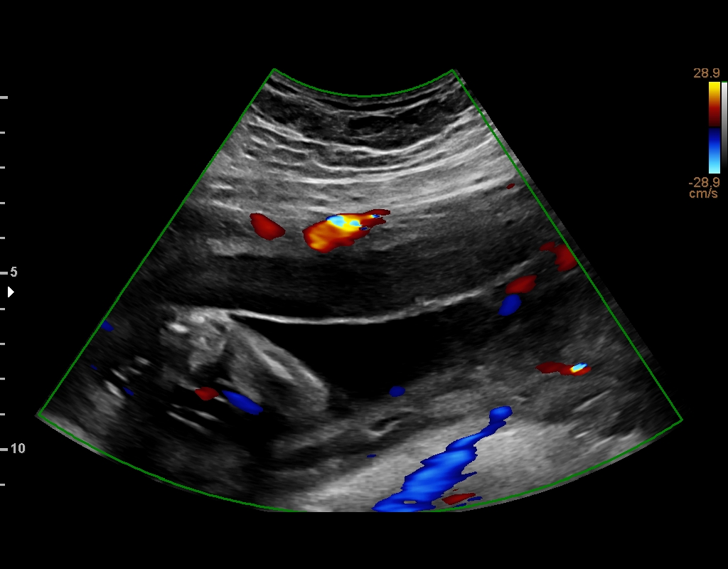

[13 of 28 positions shown; findings below may reference images not displayed]

Indications

25 weeks gestation of pregnancy
Encounter for fetal anatomic survey
Advanced maternal age multigravida 35+,
second trimester (neg quad screen)
Obesity complicating pregnancy, second
trimester
OB History

Blood Type:            Height:  5'3"   Weight (lb):  170       BMI:
Gravidity:    8         Term:   3        Prem:   0        SAB:   3
TOP:          1       Ectopic:  0        Living: 3
Fetal Evaluation

Num Of Fetuses:     1
Fetal Heart         153
Rate(bpm):
Cardiac Activity:   Observed
Presentation:       Cephalic
Placenta:           Anterior, above cervical os
P. Cord Insertion:  Visualized, central

Amniotic Fluid
AFI FV:      Subjectively within normal limits
Biometry

BPD:        61  mm     G. Age:  24w 6d         25  %    CI:        72.48   %    70 - 86
FL/HC:      19.8   %    18.7 -
HC:      227.9  mm     G. Age:  24w 6d         16  %    HC/AC:      1.02        1.04 -
AC:       223   mm     G. Age:  26w 5d         82  %    FL/BPD:     73.9   %    71 - 87
FL:       45.1  mm     G. Age:  25w 0d         25  %    FL/AC:      20.2   %    20 - 24
HUM:      40.6  mm     G. Age:  24w 5d         28  %
CER:      28.7  mm     G. Age:  25w 5d         56  %

Est. FW:     852  gm    1 lb 14 oz      63  %
Gestational Age

LMP:           25w 2d        Date:  12/15/16                 EDD:   09/21/17
U/S Today:     25w 3d                                        EDD:   09/20/17
Best:          25w 2d     Det. By:  LMP  (12/15/16)          EDD:   09/21/17
Anatomy

Cranium:               Appears normal         Aortic Arch:            Previously seen
Cavum:                 Appears normal         Ductal Arch:            Previously seen
Ventricles:            Appears normal         Diaphragm:              Appears normal
Choroid Plexus:        Previously seen        Stomach:                Appears normal, left
sided
Cerebellum:            Previously seen        Abdomen:                Appears normal
Posterior Fossa:       Appears normal         Abdominal Wall:         Previously seen
Nuchal Fold:           Not applicable (>20    Cord Vessels:           Previously seen
wks GA)
Face:                  Profile nl; orbits     Kidneys:                Appear normal
previously seen
Lips:                  Appears normal         Bladder:                Appears normal
Thoracic:              Appears normal         Spine:                  Not well visualized
Heart:                 Appears normal         Upper Extremities:      Previously seen
(4CH, axis, and situs
RVOT:                  Previously seen        Lower Extremities:      Previously seen
LVOT:                  Previously seen

Other:  Female gender. Heels and 5th digit visualized. Nasal bone visualized.
Technically difficult due to fetal position.
Cervix Uterus Adnexa

Cervix
Length:           3.73  cm.
Normal appearance by transabdominal scan.
Impression

Singleton intrauterine pregnancy at 25+2 weeks with AMA
here for completion of the anatomic survey
Review of the anatomy shows no sonographic markers for
aneuploidy or structural anomalies
However, views of the spine continue to be suboptimal
secondary to fetal position
There is a fairly large chorioamniotic separation at the
superior margin of the placenta
Amniotic fluid volume is normal
Estimated fetal weight shows growth in the 63rd percentile
Recommendations

The chorioamniotic separation does not appear to be a
hemorrhage.
Recommend repeat scan in 4 weeks to reassess status and
attempt to clear spine anatomy

## 2020-03-12 NOTE — L&D Delivery Note (Signed)
OB/GYN Faculty Practice Delivery Note  Cassandra [name] is a 40 y.o. [id number] s/p NSVD at [redacted]w[redacted]d She was admitted for active labor.   ROM: at ~0019 with clear fluid GBS Status: Negative Maximum Maternal Temperature: 99  Labor Progress: Presented to [facility]U with contractions, found to be 4cm, progressed to 5cm, then 7 cm and ruptured spontaneously, and was expectantly managed and progressed to complete  Delivery Date/Time: 11/12/2020 at 0306 Delivery: Called to room and patient was complete and pushing. Head delivered LOA. No nuchal cord present. Shoulder and body delivered in usual fashion. Infant with spontaneous cry, placed on mother's abdomen, dried and stimulated. Cord clamped x 2 after 1-minute delay, and cut by father of baby. Cord blood drawn. Placenta delivered spontaneously with gentle cord traction. Fundus firm with massage and Pitocin. Labia, perineum, vagina, and cervix inspected and there were no lacerations. Patient did have bleeding with EBL of 500cc and was given TXA and pitocin, had appropriate tone and fundus was firm 1cm below the umbilicus  Placenta: intact, 3V cord, to L&D Complications: None Lacerations: None EBL: 500cc Analgesia: None  Infant: female  APGARs 9,9  pending weight  ARenard Matter MD, MPH OB Fellow, F[place]for [facility] 11/12/2020, 3:32 AM

## 2020-04-15 DIAGNOSIS — Z3682 Encounter for antenatal screening for nuchal translucency: Secondary | ICD-10-CM | POA: Diagnosis not present

## 2020-04-15 DIAGNOSIS — Z3A08 8 weeks gestation of pregnancy: Secondary | ICD-10-CM | POA: Diagnosis not present

## 2020-04-15 DIAGNOSIS — Z363 Encounter for antenatal screening for malformations: Secondary | ICD-10-CM | POA: Diagnosis not present

## 2020-04-15 DIAGNOSIS — O09521 Supervision of elderly multigravida, first trimester: Secondary | ICD-10-CM | POA: Diagnosis not present

## 2020-04-15 LAB — OB RESULTS CONSOLE HGB/HCT, BLOOD
HCT: 36 (ref 29–41)
Hemoglobin: 11.9

## 2020-04-15 LAB — OB RESULTS CONSOLE GC/CHLAMYDIA
Chlamydia: NEGATIVE
Gonorrhea: NEGATIVE

## 2020-04-15 LAB — OB RESULTS CONSOLE RUBELLA ANTIBODY, IGM: Rubella: IMMUNE

## 2020-04-15 LAB — OB RESULTS CONSOLE ANTIBODY SCREEN: Antibody Screen: NEGATIVE

## 2020-04-15 LAB — OB RESULTS CONSOLE RPR: RPR: NONREACTIVE

## 2020-04-15 LAB — OB RESULTS CONSOLE PLATELET COUNT: Platelets: 325

## 2020-04-15 LAB — SICKLE CELL SCREEN: Sickle Cell Screen: NORMAL

## 2020-04-15 LAB — OB RESULTS CONSOLE HIV ANTIBODY (ROUTINE TESTING): HIV: NONREACTIVE

## 2020-05-01 LAB — OB RESULTS CONSOLE PLATELET COUNT: Platelets: 271

## 2020-05-01 LAB — OB RESULTS CONSOLE ABO/RH: RH Type: POSITIVE

## 2020-05-01 LAB — OB RESULTS CONSOLE HEPATITIS B SURFACE ANTIGEN: Hepatitis B Surface Ag: NEGATIVE

## 2020-05-01 LAB — OB RESULTS CONSOLE HGB/HCT, BLOOD
HCT: 38 (ref 29–41)
Hemoglobin: 12.6

## 2020-05-13 DIAGNOSIS — Z3A12 12 weeks gestation of pregnancy: Secondary | ICD-10-CM | POA: Diagnosis not present

## 2020-05-13 DIAGNOSIS — O2311 Infections of bladder in pregnancy, first trimester: Secondary | ICD-10-CM | POA: Diagnosis not present

## 2020-05-13 DIAGNOSIS — Z3689 Encounter for other specified antenatal screening: Secondary | ICD-10-CM | POA: Diagnosis not present

## 2020-05-13 LAB — GLUCOSE, 1 HOUR GESTATIONAL: Glucose, 1 Hour GTT: 103

## 2020-08-05 DIAGNOSIS — Z362 Encounter for other antenatal screening follow-up: Secondary | ICD-10-CM | POA: Diagnosis not present

## 2020-08-10 ENCOUNTER — Encounter: Payer: Self-pay | Admitting: *Deleted

## 2020-08-18 DIAGNOSIS — O0990 Supervision of high risk pregnancy, unspecified, unspecified trimester: Secondary | ICD-10-CM

## 2020-08-31 ENCOUNTER — Encounter: Payer: Self-pay | Admitting: Obstetrics and Gynecology

## 2020-08-31 ENCOUNTER — Other Ambulatory Visit: Payer: Self-pay

## 2020-08-31 ENCOUNTER — Ambulatory Visit (INDEPENDENT_AMBULATORY_CARE_PROVIDER_SITE_OTHER): Payer: Medicaid Other | Admitting: Obstetrics and Gynecology

## 2020-08-31 VITALS — BP 105/68 | HR 83 | Wt 209.1 lb

## 2020-08-31 DIAGNOSIS — E669 Obesity, unspecified: Secondary | ICD-10-CM | POA: Diagnosis not present

## 2020-08-31 DIAGNOSIS — Z3A27 27 weeks gestation of pregnancy: Secondary | ICD-10-CM

## 2020-08-31 DIAGNOSIS — O09523 Supervision of elderly multigravida, third trimester: Secondary | ICD-10-CM | POA: Diagnosis not present

## 2020-08-31 DIAGNOSIS — O0990 Supervision of high risk pregnancy, unspecified, unspecified trimester: Secondary | ICD-10-CM | POA: Diagnosis not present

## 2020-08-31 MED ORDER — BLOOD PRESSURE KIT: 1.0000 | PACK | 0 refills | Status: DC | PRN

## 2020-08-31 NOTE — Progress Notes (Signed)
   PRENATAL VISIT NOTE  Subjective:  Cassandra [name] is a 40 y.o. [phone] at 41w5dbeing seen today for ongoing prenatal care.  She is currently monitored for the following issues for this high-risk pregnancy and has Heart palpitations; Hypertriglyceridemia; Constipation; IBS (irritable bowel syndrome); AMA (advanced maternal age) multigravida 315+ Obesity (BMI 30-39.9); Supervision of high risk pregnancy, antepartum; and [redacted] weeks gestation of pregnancy on their problem list.  [place]  She is up to date on her care and testing.  Per pt she has never had blood pressure or GDM issues with prior pregnancies.  FOB has already had a vasectomy performed.  Patient doing well with no acute concerns today. She reports no complaints.  Contractions: Irritability. Vag. Bleeding: None.  Movement: Present. Denies leaking of fluid.   The following portions of the patient's history were reviewed and updated as appropriate: allergies, current medications, past family history, past medical history, past social history, past surgical history and problem list. Problem list updated.  Objective:   Vitals:   08/31/20 1017  BP: 105/68  Pulse: 83  Weight: 209 lb 1.6 oz (94.8 kg)    Fetal Status: Fetal Heart Rate (bpm): 160 Fundal Height: 29 cm Movement: Present     General:  Alert, oriented and cooperative. Patient is in no acute distress.  Skin: Skin is warm and dry. No rash noted.   Cardiovascular: Normal heart rate noted  Respiratory: Normal respiratory effort, no problems with respiration noted  Abdomen: Soft, gravid, appropriate for gestational age.  Pain/Pressure: Absent     Pelvic: Cervical exam deferred        Extremities: Normal range of motion.  Edema: None  Mental Status:  Normal mood and affect. Normal behavior. Normal judgment and thought content.   Assessment and Plan:  Pregnancy: [id number] 270w5d1. Supervision of high risk pregnancy,  antepartum Will continue routine care, 2 hour GTT and third trimester labs ordered for next week - Blood Pressure KIT; 1 Device by Does not apply route as needed.  Dispense: 1 kit; Refill: 0 - [facility] AMB BABYSCRIPTS SCHEDULE OPTIMIZATION - Glucose Tolerance, 2 Hours w/1 Hour; Future - CBC; Future - HIV Antibody (routine testing w rflx); Future - RPR; Future  2. Multigravida of advanced maternal age in third trimester MFM scan ordered for next 1-2 weeks to get pt back on schedule, testing at 32 weeks - [place] OB [place] +14 WK; Future  3. Obesity (BMI 30-39.9)   4. [redacted] weeks gestation of pregnancy   Preterm labor symptoms and general obstetric precautions including but not limited to vaginal bleeding, contractions, leaking of fluid and fetal movement were reviewed in detail with the patient.  Please refer to After Visit Summary for other counseling recommendations.   [facility] in about 2 weeks (around 09/14/2020) [facility]   LaLynnda ShieldsMD Faculty Attending Center for [facility]

## 2020-09-01 ENCOUNTER — Encounter: Payer: Self-pay | Admitting: *Deleted

## 2020-09-08 ENCOUNTER — Other Ambulatory Visit: Payer: Medicaid Other

## 2020-09-08 ENCOUNTER — Other Ambulatory Visit: Payer: Self-pay

## 2020-09-08 DIAGNOSIS — O0990 Supervision of high risk pregnancy, unspecified, unspecified trimester: Secondary | ICD-10-CM | POA: Diagnosis not present

## 2020-09-09 LAB — GLUCOSE TOLERANCE, 2 HOURS W/ 1HR
Glucose, 1 hour: 142 mg/dL (ref 65–179)
Glucose, 2 hour: 134 mg/dL (ref 65–152)
Glucose, Fasting: 94 mg/dL — ABNORMAL HIGH (ref 65–91)

## 2020-09-09 LAB — CBC
Hematocrit: 35.5 % (ref 34.0–46.6)
Hemoglobin: 11.7 g/dL (ref 11.1–15.9)
MCH: 29.3 pg (ref 26.6–33.0)
MCHC: 33 g/dL (ref 31.5–35.7)
MCV: 89 fL (ref 79–97)
Platelets: 248 10*3/uL (ref 150–450)
RBC: 3.99 x10E6/uL (ref 3.77–5.28)
RDW: 12.9 % (ref 11.7–15.4)
WBC: 10.3 10*3/uL (ref 3.4–10.8)

## 2020-09-09 LAB — HIV ANTIBODY (ROUTINE TESTING W REFLEX): HIV Screen 4th Generation wRfx: NONREACTIVE

## 2020-09-09 LAB — RPR: RPR Ser Ql: NONREACTIVE

## 2020-09-13 ENCOUNTER — Telehealth: Payer: Self-pay | Admitting: General Practice

## 2020-09-13 DIAGNOSIS — O0940 Supervision of pregnancy with grand multiparity, unspecified trimester: Secondary | ICD-10-CM

## 2020-09-13 NOTE — Telephone Encounter (Signed)
-----   Message from Griffin Basil, MD sent at 09/13/2020  9:29 AM EDT ----- Failed 2 hour GTT, needs diabetic teaching

## 2020-09-13 NOTE — Telephone Encounter (Signed)
Called patient and discussed 2 hr gtt results with patient as well as diabetes education appt. Patient verbalized understanding and states she didn't realize this test confirmed diabetes. Patient states she just barely missed the cutoff, does that mean she still has diabetes? Told patient yes and we will want her to start checking her blood sugars at home so we can see what her sugars look like over time as well. Patient asked about harm/risk to baby. Told patient the risk is much less if blood sugars are within range/under control and she is following dietary recommendations. Offered appt 7/14 @ 115 with Levada Dy and patient verbalized understanding. Patient asked about ultrasound appt. Scheduled with MFM 7/26 @ 830 as that was first available. Informed patient and provided their phone number so she could check for cancellations. Patient verbalized understanding.

## 2020-09-15 ENCOUNTER — Other Ambulatory Visit: Payer: Self-pay

## 2020-09-15 ENCOUNTER — Ambulatory Visit (INDEPENDENT_AMBULATORY_CARE_PROVIDER_SITE_OTHER): Payer: Medicaid Other

## 2020-09-15 VITALS — BP 122/79 | HR 99 | Wt 212.0 lb

## 2020-09-15 DIAGNOSIS — Z3A29 29 weeks gestation of pregnancy: Secondary | ICD-10-CM | POA: Diagnosis not present

## 2020-09-15 DIAGNOSIS — O0940 Supervision of pregnancy with grand multiparity, unspecified trimester: Secondary | ICD-10-CM

## 2020-09-15 DIAGNOSIS — O24419 Gestational diabetes mellitus in pregnancy, unspecified control: Secondary | ICD-10-CM | POA: Insufficient documentation

## 2020-09-15 DIAGNOSIS — E669 Obesity, unspecified: Secondary | ICD-10-CM

## 2020-09-15 DIAGNOSIS — Z23 Encounter for immunization: Secondary | ICD-10-CM | POA: Diagnosis not present

## 2020-09-15 DIAGNOSIS — O0990 Supervision of high risk pregnancy, unspecified, unspecified trimester: Secondary | ICD-10-CM

## 2020-09-15 HISTORY — DX: Supervision of pregnancy with grand multiparity, unspecified trimester: O09.40

## 2020-09-15 LAB — GLUCOSE, CAPILLARY: Glucose-Capillary: 106 mg/dL — ABNORMAL HIGH (ref 70–99)

## 2020-09-15 NOTE — Addendum Note (Signed)
Addended by: Jake Seats on: 09/15/2020 02:38 PM   Modules accepted: Orders

## 2020-09-15 NOTE — Progress Notes (Signed)
HIGH-RISK PREGNANCY OFFICE VISIT  Patient name: Cassandra [name] MRN [id number]  Date of birth: [birth year] Chief Complaint:   Routine Prenatal Visit  Subjective:   Cassandra [name] is a 40 y.o. [id number] female at [redacted]w[redacted]d with an Estimated Date of Delivery: 11/25/20 being seen today for ongoing management of a high-risk pregnancy aeb has Heart palpitations; Hypertriglyceridemia; Constipation; IBS (irritable bowel syndrome); AMA (advanced maternal age) multigravida 35+; Obesity (BMI 30-39.9); Supervision of high risk pregnancy, antepartum; [redacted] weeks gestation of pregnancy; and Gestational diabetes mellitus (GDM) affecting fifth pregnancy on their problem list.  Patient presents today with no complaints, but expresses frustration regarding GDM diagnosis.  Patient endorses fetal movement. Patient denies abdominal cramping or contractions.  Patient denies vaginal concerns including abnormal discharge, leaking of fluid, and bleeding.  Contractions: Not present. Vag. Bleeding: None.  Movement: Present.  Reviewed past medical,surgical, social, obstetrical and family history as well as problem list, medications and allergies.  Objective   Vitals:   09/15/20 1338  BP: 122/79  Pulse: 99  Weight: 212 lb (96.2 kg)  Body mass index is 37.55 kg/m.  Total Weight Gain:37 lb (16.8 kg)         Physical Examination:   General appearance: Well appearing, and in no distress  Mental status: Alert, oriented to person, place, and time  Skin: Warm & dry  Cardiovascular: Normal heart rate noted  Respiratory: Normal respiratory effort, no distress  Abdomen: Soft, gravid, nontender, AGA with Fundal Height: 33 cm  Pelvic: Cervical exam deferred           Extremities: Edema: None  Fetal Status: Fetal Heart Rate (bpm): 158  Movement: Present   Results for orders placed or performed in visit on 09/15/20 (from the past 24 hour(s))  Glucose, capillary   Collection Time: 09/15/20  1:42 PM  Result Value Ref Range    Glucose-Capillary 106 (H) 70 - 99 mg/dL    Assessment & Plan:  High-risk pregnancy of a 40 y.o., [id number] at [redacted]w[redacted]d with an Estimated Date of Delivery: 11/25/20   1. Supervision of high risk pregnancy, antepartum -Anticipatory guidance for upcoming appts. -Patient to schedule next appt in 2-3 weeks for an in-person visit.  2. Gestational diabetes mellitus (GDM) affecting fifth pregnancy -Extensive discussion and education regarding diagnosis. -Reassured patient that GDM is a common occurrence in pregnancy and can be managed with diet modifications. -Encouraged patient to reach out to office, via mychart or phone, regarding questions or concerns. -Brief information sheet about GDM management included in AVS. -Informed that as long as BS remain in normal range and no medications necessary will plan for IOL at 39 weeks. -Instructed to keep appt as scheduled for July 12th with Diabetic Education. -Random BS today 106 after eating one hour prior to arrival.   3. Obesity (BMI 30-39.9) -Not taking baby aspirin -TWG 37 lbs -FH 33cm.  -Discussed that growth [place] will determine size of infant.  4. [redacted] weeks gestation of pregnancy -Doing well. -Tdap given today.      Meds: No orders of the defined types were placed in this encounter.  Labs/procedures today:  Lab Orders  Glucose, capillary  POCT Glucose (CBG)     Reviewed: Preterm labor symptoms and general obstetric precautions including but not limited to vaginal bleeding, contractions, leaking of fluid and fetal movement were reviewed in detail with the patient.  All questions were answered.  Follow-up: Return in about 2 weeks (around 09/29/2020) for [place].  Orders Placed This Encounter  Procedures   Glucose, capillary   POCT Glucose (CBG)   Maryann Conners MSN, CNM 09/15/2020

## 2020-09-22 ENCOUNTER — Encounter: Payer: Medicaid Other | Attending: Obstetrics and Gynecology | Admitting: Registered"

## 2020-09-22 ENCOUNTER — Other Ambulatory Visit: Payer: Self-pay

## 2020-09-22 ENCOUNTER — Ambulatory Visit: Payer: Medicaid Other | Admitting: Registered"

## 2020-09-22 DIAGNOSIS — O0940 Supervision of pregnancy with grand multiparity, unspecified trimester: Secondary | ICD-10-CM

## 2020-09-22 DIAGNOSIS — O24419 Gestational diabetes mellitus in pregnancy, unspecified control: Secondary | ICD-10-CM | POA: Insufficient documentation

## 2020-09-22 NOTE — Progress Notes (Signed)
Patient was seen for Gestational Diabetes self-management on 09/22/20  Start time 1325 and End time 1410   Estimated due date: 12/05/20; [redacted]w[redacted]d  Clinical: Medications: reviewed Medical History: IBS Labs: OGTT elevated FBS, A1c 5.5% 1 yr ago  Dietary and Lifestyle History: Patient states her mother had GDM with all of her pregnancies, but this is patient's first GDM diagnosis.  Patient states she eats small meals and only drinks water. Pt states on weekends the family often goes out to eat at a restaurant.  May eat seafood 2-3x/month  Physical Activity: not assessed Stress: high level due to family health concerns Sleep: not assessed  24 hr Recall: First Meal: eggs, toast or home fries, and grapefruit Snack: Second meal: might just have peanut butter sandwich Snack: Third meal: chicken, rice or pasta Snack: Beverages: water ~64 oz  NUTRITION INTERVENTION  Nutrition education (E-1) on the following topics:   Initial Follow-up  [x]  []  Definition of Gestational Diabetes [x]  []  Why dietary management is important in controlling blood glucose [x]  []  Effects each nutrient has on blood glucose levels []  []  Simple carbohydrates vs complex carbohydrates [x]  []  Fluid intake [x]  []  Creating a balanced meal plan [x]  []  Carbohydrate counting  [x]  []  When to check blood glucose levels [x]  []  Proper blood glucose monitoring techniques [x]  []  Effect of stress and stress reduction techniques  [x]  []  Exercise effect on blood glucose levels, appropriate exercise during pregnancy []  []  Importance of limiting caffeine and abstaining from alcohol and smoking []  []  Medications used for blood sugar control during pregnancy [x]  []  Hypoglycemia and rule of 15 [x]  []  Postpartum self care  Blood glucose monitor given: Accu-chek Guide Me Lot #[id number] Exp: 07/15/2021 CBG: 108 mg/dL (1 hr after eating)  Patient instructed to monitor glucose levels: FBS: 60 - ? 95 mg/dL (some clinics use 90 for  cutoff) 1 hour: ? 140 mg/dL 2 hour: ? 120 mg/dL  Patient received handouts: Nutrition Diabetes and Pregnancy Carbohydrate Counting List  Patient will be seen for follow-up as needed.

## 2020-09-23 ENCOUNTER — Other Ambulatory Visit: Payer: Self-pay

## 2020-09-23 DIAGNOSIS — O24419 Gestational diabetes mellitus in pregnancy, unspecified control: Secondary | ICD-10-CM

## 2020-09-23 DIAGNOSIS — O0940 Supervision of pregnancy with grand multiparity, unspecified trimester: Secondary | ICD-10-CM

## 2020-09-23 MED ORDER — GLUCOSE BLOOD VI STRP: ORAL_STRIP | 12 refills | Status: DC

## 2020-09-23 MED ORDER — ACCU-CHEK SOFTCLIX LANCETS MISC: 12 refills | Status: DC

## 2020-09-26 ENCOUNTER — Other Ambulatory Visit: Payer: Self-pay

## 2020-09-28 ENCOUNTER — Other Ambulatory Visit: Payer: Self-pay | Admitting: Lactation Services

## 2020-09-28 MED ORDER — ACCU-CHEK GUIDE VI STRP: ORAL_STRIP | 12 refills | Status: DC

## 2020-09-28 NOTE — Progress Notes (Signed)
Reordered to specify use instructions.

## 2020-10-04 ENCOUNTER — Ambulatory Visit (INDEPENDENT_AMBULATORY_CARE_PROVIDER_SITE_OTHER): Payer: Medicaid Other | Admitting: Family Medicine

## 2020-10-04 ENCOUNTER — Encounter: Payer: Self-pay | Admitting: *Deleted

## 2020-10-04 ENCOUNTER — Other Ambulatory Visit: Payer: Self-pay | Admitting: *Deleted

## 2020-10-04 ENCOUNTER — Encounter: Payer: Self-pay | Admitting: Family Medicine

## 2020-10-04 ENCOUNTER — Ambulatory Visit: Payer: Medicaid Other | Attending: Obstetrics and Gynecology

## 2020-10-04 ENCOUNTER — Ambulatory Visit: Payer: Medicaid Other | Admitting: *Deleted

## 2020-10-04 ENCOUNTER — Other Ambulatory Visit: Payer: Self-pay

## 2020-10-04 VITALS — BP 106/71 | HR 85 | Wt 212.0 lb

## 2020-10-04 VITALS — BP 112/60 | HR 86

## 2020-10-04 DIAGNOSIS — O24419 Gestational diabetes mellitus in pregnancy, unspecified control: Secondary | ICD-10-CM

## 2020-10-04 DIAGNOSIS — E669 Obesity, unspecified: Secondary | ICD-10-CM

## 2020-10-04 DIAGNOSIS — O0990 Supervision of high risk pregnancy, unspecified, unspecified trimester: Secondary | ICD-10-CM

## 2020-10-04 DIAGNOSIS — O0940 Supervision of pregnancy with grand multiparity, unspecified trimester: Secondary | ICD-10-CM

## 2020-10-04 DIAGNOSIS — O09523 Supervision of elderly multigravida, third trimester: Secondary | ICD-10-CM | POA: Diagnosis not present

## 2020-10-04 NOTE — Progress Notes (Signed)
Patient stated that she has been having brown vaginal discharge for the past 2 weeks. Denies any vaginal odor or discomfort

## 2020-10-04 NOTE — Patient Instructions (Signed)

## 2020-10-04 NOTE — Progress Notes (Signed)
   Subjective:  Cassandra [name] is a 40 y.o. [phone] at 70w4dbeing seen today for ongoing prenatal care.  She is currently monitored for the following issues for this high-risk pregnancy and has Heart palpitations; Hypertriglyceridemia; Constipation; IBS (irritable bowel syndrome); AMA (advanced maternal age) multigravida 348+ Obesity (BMI 30-39.9); Supervision of high risk pregnancy, antepartum; [redacted] weeks gestation of pregnancy; and Gestational diabetes mellitus (GDM) affecting fifth pregnancy on their problem list.  Patient reports no complaints.  Contractions: Not present. Vag. Bleeding: None.  Movement: Present. Denies leaking of fluid.   The following portions of the patient's history were reviewed and updated as appropriate: allergies, current medications, past family history, past medical history, past social history, past surgical history and problem list. Problem list updated.  Objective:   Vitals:   10/04/20 1020  BP: 106/71  Pulse: 85  Weight: 212 lb (96.2 kg)    Fetal Status: Fetal Heart Rate (bpm): 167   Movement: Present     General:  Alert, oriented and cooperative. Patient is in no acute distress.  Skin: Skin is warm and dry. No rash noted.   Cardiovascular: Normal heart rate noted  Respiratory: Normal respiratory effort, no problems with respiration noted  Abdomen: Soft, gravid, appropriate for gestational age. Pain/Pressure: Present     Pelvic: Vag. Bleeding: None Vag D/C Character: Other (Comment)   Cervical exam deferred        Extremities: Normal range of motion.  Edema: None  Mental Status: Normal mood and affect. Normal behavior. Normal judgment and thought content.   Urinalysis:      Assessment and Plan:  Pregnancy: [id number] 321w4d1. Supervision of high risk pregnancy, antepartum BP and FHR normal Discussed contraception, husband had vasectomy in May  2. Gestational diabetes mellitus (GDM) affecting fifth pregnancy Sugars well controlled Cont diet  control MFM growth scan reportedly normal earlier today, will need repeat in 4 weeks  3. Multigravida of advanced maternal age in third trimester   4. Obesity (BMI 30-39.9)   Preterm labor symptoms and general obstetric precautions including but not limited to vaginal bleeding, contractions, leaking of fluid and fetal movement were reviewed in detail with the patient. Please refer to After Visit Summary for other counseling recommendations.  Return in 2 weeks (on 10/18/2020) [facility].   EcClarnce FlockMD

## 2020-10-19 ENCOUNTER — Telehealth: Payer: Self-pay | Admitting: *Deleted

## 2020-10-19 NOTE — Telephone Encounter (Signed)
Patient called nurse line during time office closed and call went to after hours nurse service who notified office and told patient we would call her back.  I called Cassandra [name] and she confirms she has been what she describes as cramping on / off for a week or two about every other day . States they last up to about 2 hours. States it moves from stomach to back to sides. States she always has what feels like menstrual cramps in her pregnancies.  She confirms good fetal movement. She states she has been under a lot of stress lately . States her Mother died 2022-06-22 and now her daughter has Covid . Found out covid x 2 days ago. She also wanted to know about her ob visit tomorrow, if she can still come. I explained we can change her visit tomorrow to virtual. We discussed since her cramping is not every day and is not continuous it is most likely not labor. I advised her to go to [facility] MAU if this cramping becomes stronger, more pain and doesn't stop.  I also asked if she is having covid symptoms. She states has had headache at times but not right now. State she checks her blood pressure and it was 124/87.  She states she has a little sore throat. We reviewed otc meds she can use for her throat, cough if needed, and headache. Advised if severe headache must [facility] for evaluation.I also disucssed her symptoms of cramping and covid exposure with Dr. Kennon Rounds. Per Dr. Kennon Rounds I offered pt RX for Paxlovid with understanding is still experimental for OB pt. She declines Paxlovid.  She voices understanding of plan.  Vaughan Basta, RN

## 2020-10-20 ENCOUNTER — Telehealth (INDEPENDENT_AMBULATORY_CARE_PROVIDER_SITE_OTHER): Payer: Medicaid Other | Admitting: Obstetrics & Gynecology

## 2020-10-20 VITALS — BP 124/87

## 2020-10-20 DIAGNOSIS — Z3A35 35 weeks gestation of pregnancy: Secondary | ICD-10-CM

## 2020-10-20 DIAGNOSIS — O24419 Gestational diabetes mellitus in pregnancy, unspecified control: Secondary | ICD-10-CM

## 2020-10-20 DIAGNOSIS — O0940 Supervision of pregnancy with grand multiparity, unspecified trimester: Secondary | ICD-10-CM

## 2020-10-20 DIAGNOSIS — O0990 Supervision of high risk pregnancy, unspecified, unspecified trimester: Secondary | ICD-10-CM

## 2020-10-20 DIAGNOSIS — O0943 Supervision of pregnancy with grand multiparity, third trimester: Secondary | ICD-10-CM

## 2020-10-20 DIAGNOSIS — Z3A34 34 weeks gestation of pregnancy: Secondary | ICD-10-CM

## 2020-10-20 NOTE — Progress Notes (Signed)
    TELEHEALTH OBSTETRICS VISIT ENCOUNTER NOTE  Provider location: [facility] [place] [facility]   Patient location: Home  I connected with [name] on 10/20/20 at  1:15 PM EDT by telephone at home and verified that I am speaking with the correct person using two identifiers. Of note, unable to do video encounter due to technical difficulties.    I discussed the limitations, risks, security and privacy concerns of performing an evaluation and management service by telephone and the availability of in person appointments. I also discussed with the patient that there may be a patient responsible charge related to this service. The patient expressed understanding and agreed to proceed.  Subjective:  Cassandra [name] is a 40 y.o. [phone] at 53w6dbeing followed for ongoing prenatal care.  She is currently monitored for the following issues for this high-risk pregnancy and has Heart palpitations; Hypertriglyceridemia; Constipation; IBS (irritable bowel syndrome); AMA (advanced maternal age) multigravida 368+ Obesity (BMI 30-39.9); Supervision of high risk pregnancy, antepartum; [redacted] weeks gestation of pregnancy; and Gestational diabetes mellitus (GDM) affecting fifth pregnancy on their problem list.  Patient reports  cough . Reports fetal movement. Denies any contractions, bleeding or leaking of fluid.  Tested positive for Covid 3 days ago, no fever The following portions of the patient's history were reviewed and updated as appropriate: allergies, current medications, past family history, past medical history, past social history, past surgical history and problem list.   Objective:  Blood pressure 124/87, last menstrual period 02/19/2020. General:  Alert, oriented and cooperative.   Mental Status: Normal mood and affect perceived. Normal judgment and thought content.  Rest of physical exam deferred due to type of encounter  Assessment and Plan:  Pregnancy: [id number]  379w6d. Supervision of high risk pregnancy, antepartum F/u fetal assessment weekly  2. Gestational diabetes mellitus (GDM) affecting fifth pregnancy States good control on diet  Preterm labor symptoms and general obstetric precautions including but not limited to vaginal bleeding, contractions, leaking of fluid and fetal movement were reviewed in detail with the patient.  I discussed the assessment and treatment plan with the patient. The patient was provided an opportunity to ask questions and all were answered. The patient agreed with the plan and demonstrated an understanding of the instructions. The patient was advised to call back or seek an in-person office evaluation/[facility] any [facility] or concerning symptoms. Please refer to After Visit Summary for other counseling recommendations.   I provided 10 minutes of non-face-to-face time during this encounter.  Return in about 1 week (around 10/27/2020).  Future Appointments  Date Time Provider [place]8/26/2022 12:30 PM [facility]/26/2022 12:45 PM W[facility]-MFC US5 [facility]FCUS [facility]  JaEmeterio ReeveMDOakleyor WoKankakee[facility]ba

## 2020-10-27 ENCOUNTER — Other Ambulatory Visit (HOSPITAL_COMMUNITY)
Admission: RE | Admit: 2020-10-27 | Discharge: 2020-10-27 | Disposition: A | Discharge status: Home / Self Care | Payer: Medicaid Other | Source: Ambulatory Visit | Attending: Obstetrics and Gynecology | Admitting: Obstetrics and Gynecology

## 2020-10-27 ENCOUNTER — Other Ambulatory Visit: Payer: Self-pay

## 2020-10-27 ENCOUNTER — Ambulatory Visit (INDEPENDENT_AMBULATORY_CARE_PROVIDER_SITE_OTHER): Payer: Medicaid Other | Admitting: Obstetrics and Gynecology

## 2020-10-27 VITALS — BP 123/77 | HR 94 | Wt 209.8 lb

## 2020-10-27 DIAGNOSIS — O0990 Supervision of high risk pregnancy, unspecified, unspecified trimester: Secondary | ICD-10-CM | POA: Insufficient documentation

## 2020-10-27 DIAGNOSIS — O24419 Gestational diabetes mellitus in pregnancy, unspecified control: Secondary | ICD-10-CM

## 2020-10-27 DIAGNOSIS — O0940 Supervision of pregnancy with grand multiparity, unspecified trimester: Secondary | ICD-10-CM

## 2020-10-27 DIAGNOSIS — Z3A36 36 weeks gestation of pregnancy: Secondary | ICD-10-CM | POA: Insufficient documentation

## 2020-10-27 DIAGNOSIS — O09523 Supervision of elderly multigravida, third trimester: Secondary | ICD-10-CM

## 2020-10-27 NOTE — Progress Notes (Signed)
   PRENATAL VISIT NOTE  Subjective:  Cassandra [name] is a 40 y.o. [phone] at 53w6dbeing seen today for ongoing prenatal care.  She is currently monitored for the following issues for this high-risk pregnancy and has Heart palpitations; Hypertriglyceridemia; Constipation; IBS (irritable bowel syndrome); AMA (advanced maternal age) multigravida 347+ Obesity (BMI 30-39.9); Supervision of high risk pregnancy, antepartum; Gestational diabetes mellitus (GDM) affecting fifth pregnancy; and [redacted] weeks gestation of pregnancy on their problem list.  Patient doing well with no acute concerns today. She reports occasional contractions.  Contractions: Irritability. Vag. Bleeding: None.  Movement: Present. Denies leaking of fluid.   The following portions of the patient's history were reviewed and updated as appropriate: allergies, current medications, past family history, past medical history, past social history, past surgical history and problem list. Problem list updated.  Objective:   Vitals:   10/27/20 1055  BP: 123/77  Pulse: 94  Weight: 209 lb 12.8 oz (95.2 kg)    Fetal Status: Fetal Heart Rate (bpm): 150   Movement: Present  Presentation: Vertex  General:  Alert, oriented and cooperative. Patient is in no acute distress.  Skin: Skin is warm and dry. No rash noted.   Cardiovascular: Normal heart rate noted  Respiratory: Normal respiratory effort, no problems with respiration noted  Abdomen: Soft, gravid, appropriate for gestational age.  Pain/Pressure: Present     Pelvic: Cervical exam performed Dilation: 1 Effacement (%): 50 Station: -3  Extremities: Normal range of motion.  Edema: None  Mental Status:  Normal mood and affect. Normal behavior. Normal judgment and thought content.   Assessment and Plan:  Pregnancy: [id number] 329w6d1. Supervision of high risk pregnancy, antepartum Continue routine care - GC/Chlamydia probe amp [facility] Culture, beta strep (group b  only)  2. [redacted] weeks gestation of pregnancy   3. Gestational diabetes mellitus (GDM) affecting fifth pregnancy Blood sugars in good control with diet, pt has growth scan 11/04/20 Delivery at 39-40 weeks, may lean toward 39 weeks due to AMSauget4. Multigravida of advanced maternal age in third trimester   Preterm labor symptoms and general obstetric precautions including but not limited to vaginal bleeding, contractions, leaking of fluid and fetal movement were reviewed in detail with the patient.  Please refer to After Visit Summary for other counseling recommendations.   Return in about 1 week (around 11/03/2020) [facility].   LaLynnda ShieldsMD Faculty Attending Center [facility]

## 2020-10-28 LAB — GC/CHLAMYDIA PROBE AMP (~~LOC~~) NOT AT ARMC
Chlamydia: NEGATIVE
Comment: NEGATIVE
Comment: NORMAL
Neisseria Gonorrhea: NEGATIVE

## 2020-10-30 LAB — CULTURE, BETA STREP (GROUP B ONLY): Strep Gp B Culture: NEGATIVE

## 2020-11-01 ENCOUNTER — Inpatient Hospital Stay (HOSPITAL_COMMUNITY)
Admission: AD | Admit: 2020-11-01 | Discharge: 2020-11-01 | Disposition: A | Discharge status: Home / Self Care | Payer: Medicaid Other | Attending: Obstetrics & Gynecology | Admitting: Obstetrics & Gynecology

## 2020-11-01 ENCOUNTER — Encounter (HOSPITAL_COMMUNITY): Payer: Self-pay | Admitting: Obstetrics & Gynecology

## 2020-11-01 DIAGNOSIS — Z0371 Encounter for suspected problem with amniotic cavity and membrane ruled out: Secondary | ICD-10-CM

## 2020-11-01 DIAGNOSIS — Z3689 Encounter for other specified antenatal screening: Secondary | ICD-10-CM | POA: Insufficient documentation

## 2020-11-01 DIAGNOSIS — Z3A36 36 weeks gestation of pregnancy: Secondary | ICD-10-CM | POA: Insufficient documentation

## 2020-11-01 DIAGNOSIS — O0990 Supervision of high risk pregnancy, unspecified, unspecified trimester: Secondary | ICD-10-CM

## 2020-11-01 HISTORY — DX: Benign neoplasm of connective and other soft tissue, unspecified: D21.9

## 2020-11-01 HISTORY — DX: Urinary tract infection, site not specified: N39.0

## 2020-11-01 HISTORY — DX: Gestational diabetes mellitus in pregnancy, unspecified control: O24.419

## 2020-11-01 LAB — POCT FERN TEST: POCT Fern Test: NEGATIVE

## 2020-11-01 NOTE — MAU Note (Signed)
Woke up around 11 with ctx's- in her back, made it's way to the front. After urinating, was standing "all this fluid came out", none since. No bleeding. Was 1+ when last checked.

## 2020-11-01 NOTE — MAU Provider Note (Signed)
Event Date/Time  First Provider Initiated Contact with Patient 11/01/20 1707      S: Cassandra [name] is a 40 y.o. [phone] at [redacted]w[redacted]d who presents to MAU today complaining of painful contractions since 11 am today. Origin of pain is in her back, radiating anteriorly to her lower abdomen.Patient endorses one episode of leaking fluid around the same time. She denies vaginal   O: BP 124/73 (BP Location: Right Arm)   Pulse 93   Temp 98.4 F (36.9 C) (Oral)   Resp 18   Ht '5\' 3"'$  (1.6 m)   Wt 96.6 kg   LMP 02/19/2020   SpO2 98%   BMI 37.71 kg/m    GENERAL: Well-developed, well-nourished female in no acute distress.  HEAD: Normocephalic, atraumatic.  CHEST: Normal effort of breathing, regular heart rate ABDOMEN: Soft, nontender, gravid PELVIC: Normal external female genitalia. Vagina is pink and rugated. Cervix with normal contour, no lesions. Normal discharge.  Negative pooling.   Cervical exam:  Dilation: 3 Effacement (%): 50 Cervical Position: Posterior Station: -3 Presentation: Vertex Exam by:: Aniken Monestime   Fetal Monitoring: Baseline: 145 Variability: Mod  Accelerations: 15 x 15 Decelerations: N/A Contractions: Occasional   Results for orders placed or performed during the hospital encounter of 11/01/20 (from the past 24 hour(s))  Fern Test     Status: None   Collection Time: 11/01/20  3:25 PM  Result Value Ref Range   POCT Fern Test Negative = intact amniotic membranes      A: SIUP at 371w4dIntact amniotic sac (negative pooling, negative fern) Cervix remains tight 3 cm, rare contractions on toco, no cervical change after 90 minutes of monitoring  P: Discharge home in stable condition with labor precautions  SaMallie SnooksCNM

## 2020-11-01 NOTE — Discharge Instructions (Signed)
[employer]

## 2020-11-03 ENCOUNTER — Other Ambulatory Visit: Payer: Self-pay

## 2020-11-03 ENCOUNTER — Ambulatory Visit (INDEPENDENT_AMBULATORY_CARE_PROVIDER_SITE_OTHER): Payer: Medicaid Other | Admitting: Obstetrics and Gynecology

## 2020-11-03 VITALS — BP 119/75 | HR 82 | Wt 211.6 lb

## 2020-11-03 DIAGNOSIS — O99210 Obesity complicating pregnancy, unspecified trimester: Secondary | ICD-10-CM

## 2020-11-03 DIAGNOSIS — E669 Obesity, unspecified: Secondary | ICD-10-CM

## 2020-11-03 DIAGNOSIS — Z3A36 36 weeks gestation of pregnancy: Secondary | ICD-10-CM

## 2020-11-03 DIAGNOSIS — O0940 Supervision of pregnancy with grand multiparity, unspecified trimester: Secondary | ICD-10-CM

## 2020-11-03 DIAGNOSIS — O24419 Gestational diabetes mellitus in pregnancy, unspecified control: Secondary | ICD-10-CM

## 2020-11-03 DIAGNOSIS — O09523 Supervision of elderly multigravida, third trimester: Secondary | ICD-10-CM

## 2020-11-03 DIAGNOSIS — O0990 Supervision of high risk pregnancy, unspecified, unspecified trimester: Secondary | ICD-10-CM

## 2020-11-03 NOTE — Progress Notes (Signed)
   PRENATAL VISIT NOTE  Subjective:  Cassandra [name] is a 40 y.o. [phone] at 59w6dbeing seen today for ongoing prenatal care.  She is currently monitored for the following issues for this high-risk pregnancy and has Heart palpitations; Hypertriglyceridemia; Constipation; IBS (irritable bowel syndrome); AMA (advanced maternal age) multigravida 318+ Obesity (BMI 30-39.9); Obesity in pregnancy; Supervision of high risk pregnancy, antepartum; Gestational diabetes mellitus (GDM) affecting fifth pregnancy; and [redacted] weeks gestation of pregnancy on their problem list.  Patient reports no complaints.  Contractions: Irritability. Vag. Bleeding: None.  Movement: Present. Denies leaking of fluid.   The following portions of the patient's history were reviewed and updated as appropriate: allergies, current medications, past family history, past medical history, past social history, past surgical history and problem list.   Objective:   Vitals:   11/03/20 1051  BP: 119/75  Pulse: 82  Weight: 211 lb 9.6 oz (96 kg)    Fetal Status: Fetal Heart Rate (bpm): 152   Movement: Present     General:  Alert, oriented and cooperative. Patient is in no acute distress.  Skin: Skin is warm and dry. No rash noted.   Cardiovascular: Normal heart rate noted  Respiratory: Normal respiratory effort, no problems with respiration noted  Abdomen: Soft, gravid, appropriate for gestational age.  Pain/Pressure: Present     Pelvic: Cervical exam deferred        Extremities: Normal range of motion.  Edema: None  Mental Status: Normal mood and affect. Normal behavior. Normal judgment and thought content.   Assessment and Plan:  Pregnancy: [id number] 327w6d. Supervision of high risk pregnancy, antepartum Fob s/p vasectomy already. Semen alaysis pending Gbs neg D/w her will set up 39wk iol nv F/u mfm growth and bpp tomorrow. To get qwk testing thereafter  2. [redacted] weeks gestation of pregnancy  3. Gestational diabetes mellitus  (GDM) affecting fifth pregnancy Not checking sugars due to mom passing recently but patient states she will start today. Pt to let usKoreanow if are elevated. Was a1 when was checking them last  4. Multigravida of advanced maternal age in third trimester No issues  5. Obesity (BMI 30-39.9) No issues  6. Obesity in pregnancy  Preterm labor symptoms and general obstetric precautions including but not limited to vaginal bleeding, contractions, leaking of fluid and fetal movement were reviewed in detail with the patient. Please refer to After Visit Summary for other counseling recommendations.   Return in about 5 days (around 11/08/2020) for 5-7, in person, md or app, high risk ob.  Future Appointments  Date Time Provider De[place]8/26/2022 12:30 PM [facility]8/26/2022 12:45 PM W[facility]-M[facility] US5 [facility]-MFCUS [facility]MC[facility]9/03/2020  9:55 AM Anyanwu, UgSallyanne HaversMD [facility]  ChAletha HalimMD

## 2020-11-04 ENCOUNTER — Encounter: Payer: Self-pay | Admitting: *Deleted

## 2020-11-04 ENCOUNTER — Other Ambulatory Visit: Payer: Self-pay | Admitting: *Deleted

## 2020-11-04 ENCOUNTER — Ambulatory Visit: Payer: Medicaid Other | Admitting: *Deleted

## 2020-11-04 ENCOUNTER — Ambulatory Visit: Payer: Medicaid Other | Attending: Obstetrics and Gynecology

## 2020-11-04 VITALS — BP 111/65 | HR 89

## 2020-11-04 DIAGNOSIS — E669 Obesity, unspecified: Secondary | ICD-10-CM | POA: Diagnosis not present

## 2020-11-04 DIAGNOSIS — O24419 Gestational diabetes mellitus in pregnancy, unspecified control: Secondary | ICD-10-CM | POA: Diagnosis present

## 2020-11-04 DIAGNOSIS — O09523 Supervision of elderly multigravida, third trimester: Secondary | ICD-10-CM

## 2020-11-04 DIAGNOSIS — O0990 Supervision of high risk pregnancy, unspecified, unspecified trimester: Secondary | ICD-10-CM

## 2020-11-04 DIAGNOSIS — O3413 Maternal care for benign tumor of corpus uteri, third trimester: Secondary | ICD-10-CM | POA: Diagnosis not present

## 2020-11-04 DIAGNOSIS — Z3A37 37 weeks gestation of pregnancy: Secondary | ICD-10-CM

## 2020-11-04 DIAGNOSIS — O99213 Obesity complicating pregnancy, third trimester: Secondary | ICD-10-CM

## 2020-11-04 DIAGNOSIS — O24410 Gestational diabetes mellitus in pregnancy, diet controlled: Secondary | ICD-10-CM

## 2020-11-04 DIAGNOSIS — D259 Leiomyoma of uterus, unspecified: Secondary | ICD-10-CM

## 2020-11-07 ENCOUNTER — Telehealth: Payer: Self-pay | Admitting: General Practice

## 2020-11-07 NOTE — Telephone Encounter (Signed)
Patient called into front office reporting lightheadedness and dizziness for past couple of hours. Patient states BP is normal 120/70 and blood sugar is 99. Patient states she tried drinking some water and initially felt better but not for long. Encouraged patient to drink at least 36 oz of water over the next hour and eat to see if that improves anything. Recommended she call back if she isn't feeling better in a couple hours. Patient verbalized understanding.

## 2020-11-10 ENCOUNTER — Ambulatory Visit (INDEPENDENT_AMBULATORY_CARE_PROVIDER_SITE_OTHER): Payer: Medicaid Other | Admitting: Obstetrics & Gynecology

## 2020-11-10 ENCOUNTER — Other Ambulatory Visit: Payer: Self-pay

## 2020-11-10 VITALS — BP 112/72 | HR 98 | Wt 212.0 lb

## 2020-11-10 DIAGNOSIS — O0990 Supervision of high risk pregnancy, unspecified, unspecified trimester: Secondary | ICD-10-CM | POA: Diagnosis not present

## 2020-11-10 DIAGNOSIS — O09523 Supervision of elderly multigravida, third trimester: Secondary | ICD-10-CM

## 2020-11-10 DIAGNOSIS — Z3A37 37 weeks gestation of pregnancy: Secondary | ICD-10-CM

## 2020-11-10 DIAGNOSIS — O24410 Gestational diabetes mellitus in pregnancy, diet controlled: Secondary | ICD-10-CM

## 2020-11-10 NOTE — Patient Instructions (Signed)
Return to office for any scheduled appointments. Call the office or go to the [facility] [place] if:  You begin to have strong, frequent contractions  Your water breaks.  Sometimes it is a big gush of fluid, sometimes it is just a trickle that keeps getting your panties wet or running down your legs  You have vaginal bleeding.  It is normal to have a small amount of spotting if your cervix was checked.   You do not feel your baby moving like normal.  If you do not, get something to eat and drink and lay down and focus on feeling your baby move.   If your baby is still not moving like normal, you should call the office or go to MAU.  Any other obstetric concerns.   

## 2020-11-10 NOTE — Progress Notes (Signed)
PRENATAL VISIT NOTE  Subjective:  Cassandra [name] is a 40 y.o. [phone] at 58w6dbeing seen today for ongoing prenatal care.  She is currently monitored for the following issues for this high-risk pregnancy and has Heart palpitations; Hypertriglyceridemia; Constipation; IBS (irritable bowel syndrome); AMA (advanced maternal age) multigravida 347+ Obesity (BMI 30-39.9); Obesity in pregnancy; Supervision of high risk pregnancy, antepartum; and Gestational diabetes mellitus (GDM) in third trimester on their problem list.  Patient reports occasional contractions for 2 days.  Contractions: Irritability. Vag. Bleeding: None.  Movement: Present. Denies leaking of fluid.   The following portions of the patient's history were reviewed and updated as appropriate: allergies, current medications, past family history, past medical history, past social history, past surgical history and problem list.   Objective:   Vitals:   11/10/20 1019  BP: 112/72  Pulse: 98  Weight: 212 lb (96.2 kg)    Fetal Status: Fetal Heart Rate (bpm): 163   Movement: Present  Presentation: Vertex  General:  Alert, oriented and cooperative. Patient is in no acute distress.  Skin: Skin is warm and dry. No rash noted.   Cardiovascular: Normal heart rate noted  Respiratory: Normal respiratory effort, no problems with respiration noted  Abdomen: Soft, gravid, appropriate for gestational age.  Pain/Pressure: Present     Pelvic: Cervical exam performed in the presence of a chaperone Dilation: 4 Effacement (%): 60 Station: -2  Extremities: Normal range of motion.  Edema: None  Mental Status: Normal mood and affect. Normal behavior. Normal judgment and thought content.   Imaging: [place]FM FETAL BPP WO NON STRESS  Result Date: 11/04/2020 ----------------------------------------------------------------------  OBSTETRICS REPORT                       (Signed Final 11/04/2020 01:41 pm)  ---------------------------------------------------------------------- Patient Info  ID #:       [id number]                         D.O.B.:  109/17/1982(39 yrs)  Name:       Cassandra [name]                  Visit Date: 11/04/2020 12:42 pm ---------------------------------------------------------------------- Performed By  Attending:        VJohnell ComingsMD         Secondary Phy.:   LGriffin BasilMD  Performed By:     BGermain Osgood           [facility]  Referred By:      [place]          Location[facility]                                [facility] at                                                             [employer] [facility]                                                             Women  Ref. Address:     [address]                    [place], [place] ---------------------------------------------------------------------- Orders  #  Description                           Code        Ordered By  1  [place] MFM FETAL BPP WO NON               76819.01    RAVI SHANKAR     STRESS  2  [place] MFM OB FOLLOW UP                   [id number]    RAVI [facility] ----------------------------------------------------------------------  #  Order #                     Accession #                Episode #  1  [id number]                   [id number]                 [id number]  2  [id number]                   [id number]                 [id number] ---------------------------------------------------------------------- Indications  Gestational diabetes in pregnancy, diet        O24.410  controlled  Obesity complicating pregnancy, third          O99.213  trimester (BMI 4)  Advanced maternal age multigravida 29+,        O58.523  third trimester   Negative AFP/ LR NIPS  Uterine fibroids affecting pregnancy in third  O34.13, D25.9  trimester, antepartum  [redacted] weeks gestation of pregnancy                Z3A.37 ---------------------------------------------------------------------- Fetal Evaluation  Num Of Fetuses:  1  Fetal Heart Rate(bpm):  152  Cardiac Activity:       Observed  Presentation:           Cephalic  Placenta:               Posterior  P. Cord Insertion:      Visualized, central  Amniotic Fluid  AFI FV:      Within normal limits  AFI Sum(cm)     %Tile       Largest Pocket(cm)  13.87           52          4.64  RUQ(cm)       RLQ(cm)       LUQ(cm)        LLQ(cm)  1.47          4.64          3.29           4.47 ---------------------------------------------------------------------- Biophysical Evaluation  Amniotic F.V:   Pocket => 2 cm             F. Tone:        Observed  F. Movement:    Observed                   Score:          8/8  F. Breathing:   Observed ---------------------------------------------------------------------- Biometry  BPD:      90.6  mm     G. Age:  36w 5d         58  %    CI:        74.41   %    70 - 86                                                          FL/HC:      20.5   %    20.8 - 22.6  HC:      333.4  mm     G. Age:  38w 1d         50  %    HC/AC:      0.96        0.92 - 1.05  AC:      347.4  mm     G. Age:  38w 5d         94  %    FL/BPD:     75.5   %    71 - 87  FL:       68.4  mm     G. Age:  35w 1d          9  %    FL/AC:      19.7   %    20 - 24  HUM:      57.9  mm     G. Age:  33w 4d        < 5  %  Est. FW:    3243  gm      7 lb 2 oz     71  % ---------------------------------------------------------------------- OB History  Blood Type:   A+  Gravidity:    9         Term:  4        Prem:   0        SAB:   3  TOP:          1       Ectopic:  0        Living: 4 ---------------------------------------------------------------------- Gestational Age  LMP:           37w 0d        Date:  02/19/20                 EDD:    11/25/20  U/S Today:     37w 1d                                        EDD:   11/24/20  Best:          37w 0d     Det. By:  LMP  (02/19/20)          EDD:   11/25/20 ---------------------------------------------------------------------- Anatomy  Cranium:               Appears normal         LVOT:                   Previously seen  Cavum:                 Previously seen        Aortic Arch:            Not well visualized  Ventricles:            Previously seen        Ductal Arch:            Not well visualized  Choroid Plexus:        Previously seen        Diaphragm:              Appears normal  Cerebellum:            Previously seen        Stomach:                Appears normal, left                                                                        sided  Posterior Fossa:       Previously seen        Abdomen:                Appears normal  Nuchal Fold:           Not applicable (Q000111Q    Abdominal Wall:         Previously seen                         wks GA)  Face:                  Orbits and profile     Cord Vessels:           Previously seen  previously seen  Lips:                  Previously seen        Kidneys:                Appear normal  Palate:                Not well visualized    Bladder:                Appears normal  Thoracic:              Previously seen        Spine:                  Limited views prev                                                                        seen  Heart:                 Appears normal         Upper Extremities:      Not well visualized                         (4CH, axis, and                         situs)  RVOT:                  Previously seen        Lower Extremities:      Prev Visualized  Other:  Fetus appears to be a female. Lenses and Nasal bone prev visualized.          Technically difficult due to advanced GA and fetal position. ---------------------------------------------------------------------- Cervix Uterus Adnexa  Cervix  Not visualized  (advanced GA >24wks) ---------------------------------------------------------------------- Comments  This patient was seen for a follow up growth scan due to  advanced maternal age greater than 43 and diet-controlled  gestational diabetes.  She denies any problems since her last  exam.  She was informed that the fetal growth and amniotic fluid  level appears appropriate for her gestational age.  Biophysical profile performed today was 8 out of 8.  Due to her age and gestational diabetes, an NST was  scheduled in our office in 1 week. ----------------------------------------------------------------------                   Johnell Comings, MD Electronically Signed Final Report   11/04/2020 01:41 pm ----------------------------------------------------------------------  [place] MFM OB FOLLOW UP  Result Date: 11/04/2020 ----------------------------------------------------------------------  OBSTETRICS REPORT                       (Signed Final 11/04/2020 01:41 pm) ---------------------------------------------------------------------- Patient Info  ID #:       [id number]                          D.O.B.:  September 08, 1980 (39 yrs)  Name:       Wellington Hampshire Berquist  Visit Date: 11/04/2020 12:42 pm ---------------------------------------------------------------------- Performed By  Attending:        Johnell Comings MD         Secondary Phy.:   Griffin Basil MD  Performed By:     Germain Osgood            Address[facility]          [facility]                    [place]  Referred By:      [place]          Location[facility]         [facility]                                F[facility] Care at                                                             [facility] for                                                              Women  Ref. Address:     [address]                    [place], [place] ---------------------------------------------------------------------- Orders  #  Description                           Code        Ordered By  1  [place] MFM FETAL BPP WO NON               [id number]  RAVI SHANKAR     STRESS  2  [place] MFM OB FOLLOW UP                   [id number]    RAVI [facility] ----------------------------------------------------------------------  #  Order #                     Accession #                Episode #  1  [id number]                   [id number]                 [id number]  2  [id number]                   [id number]                 [id number] ---------------------------------------------------------------------- Indications  Gestational diabetes in pregnancy, diet        O24.410  controlled  Obesity complicating pregnancy, third          O99.213  trimester (BMI 12)  Advanced maternal age multigravida 36+,        O77.523  third trimester  Negative AFP/ LR NIPS  Uterine fibroids affecting pregnancy in third  O34.13, D25.9  trimester, antepartum  [redacted] weeks gestation of pregnancy                Z3A.37 ---------------------------------------------------------------------- Fetal Evaluation  Num Of Fetuses:         1  Fetal Heart Rate(bpm):  152  Cardiac Activity:       Observed  Presentation:           Cephalic  Placenta:               Posterior  P. Cord Insertion:      Visualized, central  Amniotic Fluid  AFI FV:      Within normal limits  AFI Sum(cm)     %Tile       Largest Pocket(cm)  13.87           52          4.64  RUQ(cm)       RLQ(cm)       LUQ(cm)        LLQ(cm)  1.47          4.64          3.29           4.47 ---------------------------------------------------------------------- Biophysical Evaluation  Amniotic F.V:   Pocket => 2 cm             F. Tone:        Observed  F. Movement:    Observed                   Score:          8/8  F. Breathing:   Observed  ---------------------------------------------------------------------- Biometry  BPD:      90.6  mm     G. Age:  36w 5d         58  %    CI:        74.41   %    70 - 86  FL/HC:      20.5   %    20.8 - 22.6  HC:      333.4  mm     G. Age:  38w 1d         50  %    HC/AC:      0.96        0.92 - 1.05  AC:      347.4  mm     G. Age:  38w 5d         94  %    FL/BPD:     75.5   %    71 - 87  FL:       68.4  mm     G. Age:  35w 1d          9  %    FL/AC:      19.7   %    20 - 24  HUM:      57.9  mm     G. Age:  33w 4d        < 5  %  Est. FW:    3243  gm      7 lb 2 oz     71  % ---------------------------------------------------------------------- OB History  Blood Type:   A+  Gravidity:    9         Term:   4        Prem:   0        SAB:   3  TOP:          1       Ectopic:  0        Living: 4 ---------------------------------------------------------------------- Gestational Age  LMP:           37w 0d        Date:  02/19/20                 EDD:   11/25/20  U/S Today:     37w 1d                                        EDD:   11/24/20  Best:          37w 0d     Det. By:  LMP  (02/19/20)          EDD:   11/25/20 ---------------------------------------------------------------------- Anatomy  Cranium:               Appears normal         LVOT:                   Previously seen  Cavum:                 Previously seen        Aortic Arch:            Not well visualized  Ventricles:            Previously seen        Ductal Arch:            Not well visualized  Choroid Plexus:        Previously seen        Diaphragm:              Appears normal  Cerebellum:            Previously seen        Stomach:                Appears normal, left                                                                        sided  Posterior Fossa:       Previously seen        Abdomen:                Appears normal  Nuchal Fold:           Not applicable (Q000111Q    Abdominal Wall:         Previously  seen                         wks GA)  Face:                  Orbits and profile     Cord Vessels:           Previously seen                         previously seen  Lips:                  Previously seen        Kidneys:                Appear normal  Palate:                Not well visualized    Bladder:                Appears normal  Thoracic:              Previously seen        Spine:                  Limited views prev                                                                        seen  Heart:                 Appears normal         Upper Extremities:      Not well visualized                         (4CH, axis, and                         situs)  RVOT:                  Previously seen        Lower Extremities:  Prev Visualized  Other:  Fetus appears to be a female. Lenses and Nasal bone prev visualized.          Technically difficult due to advanced GA and fetal position. ---------------------------------------------------------------------- Cervix Uterus Adnexa  Cervix  Not visualized (advanced GA >24wks) ---------------------------------------------------------------------- Comments  This patient was seen for a follow up growth scan due to  advanced maternal age greater than 82 and diet-controlled  gestational diabetes.  She denies any problems since her last  exam.  She was informed that the fetal growth and amniotic fluid  level appears appropriate for her gestational age.  Biophysical profile performed today was 8 out of 8.  Due to her age and gestational diabetes, an NST was  scheduled in our office in 1 week. ----------------------------------------------------------------------                   Johnell Comings, MD Electronically Signed Final Report   11/04/2020 01:41 pm ----------------------------------------------------------------------   Assessment and Plan:  Pregnancy: [id number] at 38w6d1. Diet controlled gestational diabetes mellitus (GDM) in third trimester Reviewed CBGs, all within  control.  Continue diet control.  2. Multigravida of advanced maternal age in third trimester Continue weekly antenatal testing. IOL scheduled on 11/20/20 (366w2d already has a favorable cervix.  Admission orders signed and held.  3. [redacted] weeks gestation of pregnancy 4. Supervision of high risk pregnancy, antepartum Negative pelvic cultures last week.  Term labor symptoms and general obstetric precautions including but not limited to vaginal bleeding, contractions, leaking of fluid and fetal movement were reviewed in detail with the patient. Please refer to After Visit Summary for other counseling recommendations.   Return in about 1 week (around 11/17/2020) for OFFICE OB VISIT (MD only).  Future Appointments  Date Time Provider [place]9/04/2020  1:00 PM [facility]  1:15 PM [facility] [place]  1:00 PM [facility]  1:15 PM [facility]/01/2021 12:00 AM MC-LD SCHED ROOM MC-INDC None    UgVerita SchneidersMD

## 2020-11-11 ENCOUNTER — Inpatient Hospital Stay (HOSPITAL_COMMUNITY)
Admission: AD | Admit: 2020-11-11 | Discharge: 2020-11-13 | DRG: 807 | Disposition: A | Discharge status: Home / Self Care | Payer: Medicaid Other | Attending: Obstetrics & Gynecology | Admitting: Obstetrics & Gynecology

## 2020-11-11 ENCOUNTER — Ambulatory Visit: Payer: Medicaid Other | Admitting: *Deleted

## 2020-11-11 ENCOUNTER — Ambulatory Visit (HOSPITAL_BASED_OUTPATIENT_CLINIC_OR_DEPARTMENT_OTHER): Payer: Medicaid Other | Admitting: *Deleted

## 2020-11-11 ENCOUNTER — Encounter (HOSPITAL_COMMUNITY): Payer: Self-pay | Admitting: Obstetrics and Gynecology

## 2020-11-11 ENCOUNTER — Other Ambulatory Visit: Payer: Self-pay

## 2020-11-11 VITALS — BP 124/73 | HR 76

## 2020-11-11 DIAGNOSIS — O0990 Supervision of high risk pregnancy, unspecified, unspecified trimester: Secondary | ICD-10-CM

## 2020-11-11 DIAGNOSIS — O99214 Obesity complicating childbirth: Secondary | ICD-10-CM | POA: Diagnosis not present

## 2020-11-11 DIAGNOSIS — Z20822 Contact with and (suspected) exposure to covid-19: Secondary | ICD-10-CM | POA: Diagnosis not present

## 2020-11-11 DIAGNOSIS — O4202 Full-term premature rupture of membranes, onset of labor within 24 hours of rupture: Secondary | ICD-10-CM | POA: Diagnosis not present

## 2020-11-11 DIAGNOSIS — O24420 Gestational diabetes mellitus in childbirth, diet controlled: Secondary | ICD-10-CM | POA: Diagnosis not present

## 2020-11-11 DIAGNOSIS — E669 Obesity, unspecified: Secondary | ICD-10-CM | POA: Diagnosis present

## 2020-11-11 DIAGNOSIS — O24410 Gestational diabetes mellitus in pregnancy, diet controlled: Secondary | ICD-10-CM

## 2020-11-11 DIAGNOSIS — O09523 Supervision of elderly multigravida, third trimester: Secondary | ICD-10-CM

## 2020-11-11 DIAGNOSIS — Z3A38 38 weeks gestation of pregnancy: Secondary | ICD-10-CM

## 2020-11-11 DIAGNOSIS — O24419 Gestational diabetes mellitus in pregnancy, unspecified control: Secondary | ICD-10-CM | POA: Diagnosis present

## 2020-11-11 DIAGNOSIS — O09529 Supervision of elderly multigravida, unspecified trimester: Secondary | ICD-10-CM

## 2020-11-11 DIAGNOSIS — O26893 Other specified pregnancy related conditions, third trimester: Secondary | ICD-10-CM | POA: Diagnosis not present

## 2020-11-11 DIAGNOSIS — R002 Palpitations: Secondary | ICD-10-CM | POA: Diagnosis present

## 2020-11-11 MED ORDER — TERBUTALINE SULFATE 1 MG/ML IJ SOLN: 0.2500 mg | Freq: Once | INTRAMUSCULAR | Status: DC | PRN

## 2020-11-11 MED ORDER — FLEET ENEMA 7-19 GM/118ML RE ENEM: 1.0000 | ENEMA | Freq: Every day | RECTAL | Status: DC | PRN

## 2020-11-11 MED ORDER — OXYTOCIN-SODIUM CHLORIDE 30-0.9 UT/500ML-% IV SOLN
2.5000 [IU]/h | INTRAVENOUS | Status: DC
  Filled 2020-11-11: qty 500

## 2020-11-11 MED ORDER — ONDANSETRON HCL 4 MG/2ML IJ SOLN: 4.0000 mg | Freq: Four times a day (QID) | INTRAMUSCULAR | Status: DC | PRN

## 2020-11-11 MED ORDER — ACETAMINOPHEN 325 MG PO TABS: 650.0000 mg | ORAL_TABLET | ORAL | Status: DC | PRN

## 2020-11-11 MED ORDER — LACTATED RINGERS IV SOLN: 500.0000 mL | INTRAVENOUS | Status: DC | PRN

## 2020-11-11 MED ORDER — FENTANYL CITRATE (PF) 100 MCG/2ML IJ SOLN
50.0000 ug | INTRAMUSCULAR | Status: DC | PRN
  Administered 2020-11-12: 100 ug via INTRAVENOUS
  Filled 2020-11-11: qty 2

## 2020-11-11 MED ORDER — OXYCODONE-ACETAMINOPHEN 5-325 MG PO TABS: 1.0000 | ORAL_TABLET | ORAL | Status: DC | PRN

## 2020-11-11 MED ORDER — LACTATED RINGERS IV SOLN: INTRAVENOUS | Status: DC

## 2020-11-11 MED ORDER — OXYCODONE-ACETAMINOPHEN 5-325 MG PO TABS
2.0000 | ORAL_TABLET | ORAL | Status: DC | PRN
Start: 2020-11-11 — End: 2020-11-12

## 2020-11-11 MED ORDER — OXYTOCIN-SODIUM CHLORIDE 30-0.9 UT/500ML-% IV SOLN: 1.0000 m[IU]/min | INTRAVENOUS | Status: DC

## 2020-11-11 MED ORDER — LIDOCAINE HCL (PF) 1 % IJ SOLN: 30.0000 mL | INTRAMUSCULAR | Status: DC | PRN

## 2020-11-11 MED ORDER — HYDROXYZINE HCL 50 MG PO TABS: 50.0000 mg | ORAL_TABLET | Freq: Four times a day (QID) | ORAL | Status: DC | PRN

## 2020-11-11 MED ORDER — ZOLPIDEM TARTRATE 5 MG PO TABS: 5.0000 mg | ORAL_TABLET | Freq: Every evening | ORAL | Status: DC | PRN

## 2020-11-11 MED ORDER — MISOPROSTOL 25 MCG QUARTER TABLET: 25.0000 ug | ORAL_TABLET | ORAL | Status: DC | PRN

## 2020-11-11 MED ORDER — OXYTOCIN BOLUS FROM INFUSION: 333.0000 mL | Freq: Once | INTRAVENOUS | Status: DC

## 2020-11-11 MED ORDER — SOD CITRATE-CITRIC ACID 500-334 MG/5ML PO SOLN: 30.0000 mL | ORAL | Status: DC | PRN

## 2020-11-11 NOTE — MAU Note (Signed)
Pt reports ctx on and off for 2 days. Just has gotten stronger and closer. Denies any vag bleeding or leaking at this time. Good fetal movement reported

## 2020-11-11 NOTE — H&P (Signed)
OBSTETRIC ADMISSION HISTORY AND PHYSICAL  Cassandra [name] is a 40 y.o. female [phone] with IUP at 52w0dby LMP presenting for contractions and SROM and hx of GDMA1 (diet controlled). She reports +FMs, no VB, no blurry vision, headaches or peripheral edema, and RUQ pain.  She plans on bottle feeding. She is planning partner vasectomy for birth control. [facility]  Dating: By LMP --->  Estimated Date of Delivery: 11/25/20  Sono:   '@[redacted]w[redacted]d' , CWD, normal anatomy, cephalic presentation, posterior placental lie, 2229g, 73%ile EFW   Prenatal History/Complications:  GDMA1  Past Medical History: Past Medical History:  Diagnosis Date   Anxiety    Arrhythmia    Colonic polyp    Encounter for elective induction of labor 09/21/2017   Fibroid    Frequent headaches    Genital warts    GERD (gastroesophageal reflux disease)    Gestational diabetes    Gestational diabetes mellitus (GDM) affecting fifth pregnancy 09/15/2020   H/O chlamydia infection    H/O hematuria    Heart palpitations    Nocturnal headaches    Supervision of low-risk pregnancy 03/22/2017    Clinic [facility] Prenatal Labs Dating LMP Blood type: A/Positive/-- (01/11 1657)  Genetic Screen  AFPQuad: wnl    Antibody:Negative (01/11 1657) Anatomic [place]nl, incomplete - FU complete, normal anatomy, placental [place] Rubella: 1.55 (01/11 1657) GTT Early:               Third trimester: 86/147/97 RPR: Non Reactive (04/22 0928)  Flu vaccine declines HBsAg: Negative (01/11 1657)  TDaP vaccine  07/08/17   UTI (urinary tract infection)     Past Surgical History: Past Surgical History:  Procedure Laterality Date   DILATION AND CURETTAGE OF UTERUS      Obstetrical History: OB History     Gravida  9   Para  4   Term  4   Preterm  0   AB  4   Living  4      SAB  3   IAB  1   Ectopic  0   Multiple  0   Live Births  4        Obstetric Comments  SABs all around 3 months         Social  History Social History   Socioeconomic History   Marital status: Married    Spouse name: Jamel   Number of children: 3   Years of education: 11   Highest education level: Not on file  Occupational History   Not on file  Tobacco Use   Smoking status: Never   Smokeless tobacco: Never  Vaping Use   Vaping Use: Never used  Substance and Sexual Activity   Alcohol use: No    Alcohol/week: 0.0 standard drinks   Drug use: No    Comment: former (quit 2007)   Sexual activity: Yes    Partners: Male    Birth control/protection: Condom, None  Other Topics Concern   Not on file  Social History Narrative   Epworth Sleepiness Scale = 2 (01/13/2015)Lives with husband and 3 children   Caffeine use: 1-2 coffee per week.    Social Determinants of Health   Financial Resource Strain: Not on file  Food Insecurity: No Food Insecurity   Worried About RCharity fundraiserin the Last Year: Never true   Ran Out of Food in the Last Year: Never true  Transportation Needs: No Transportation Needs  Lack of Transportation (Medical): No   Lack of Transportation (Non-Medical): No  Physical Activity: Not on file  Stress: Not on file  Social Connections: Not on file    Family History: Family History  Problem Relation Age of Onset   Hyperlipidemia Mother    Hypertension Mother    Colon cancer Mother    Eczema Mother    Drug abuse Father    Hyperlipidemia Sister    Hypertension Sister    Colon polyps Sister    Arthritis Maternal Grandmother    Hyperlipidemia Maternal Grandmother    Heart disease Maternal Grandmother    Hypertension Maternal Grandmother    Diabetes Maternal Grandmother    Heart disease Maternal Grandfather    Heart disease Paternal Grandfather    Other Neg Hx        pco   Migraines Neg Hx     Allergies: No Known Allergies  Medications Prior to Admission  Medication Sig Dispense Refill Last Dose   Prenatal 27-1 MG TABS Take by mouth.   11/11/2020   Accu-Chek Softclix  Lancets lancets Use four times daily as instructed. 100 each 12    Blood Pressure KIT 1 Device by Does not apply route as needed. 1 kit 0    cetirizine (ZYRTEC) 10 MG tablet Take 1 tablet (10 mg total) by mouth daily for 30 days. 30 tablet 0    fluticasone (FLONASE) 50 MCG/ACT nasal spray Place 2 sprays into both nostrils daily for 10 days. 16 g 0    glucose blood (ACCU-CHEK GUIDE) test strip To check blood sugars 4 times a day. Fasting, and 2 hours after Breakfast, Lunch and Dinner 100 each 12    ibuprofen (ADVIL,MOTRIN) 600 MG tablet Take 1 tablet (600 mg total) by mouth every 6 (six) hours. (Patient not taking: No sig reported) 30 tablet 0      Review of Systems   All systems reviewed and negative except as stated in HPI  Blood pressure 134/76, pulse 83, temperature 98.1 F (36.7 C), temperature source Oral, resp. rate 17, height '5\' 3"'  (1.6 m), weight 97.5 kg, last menstrual period 02/19/2020. General appearance: alert Lungs: clear to auscultation bilaterally Heart: regular rate and rhythm Abdomen: soft, non-tender; bowel sounds normal Extremities: Homans sign is negative, no sign of DVT Presentation: cephalic Fetal monitoringBaseline: 130s bpm, Variability: Good {> 6 bpm), Accelerations: Reactive, and Decelerations: Absent Uterine activityFrequency: Every 1-3 minutes Dilation: 8.5 Effacement (%): 80 Station: 0 Exam by:: Wilhemina Cash RN   Prenatal labs: ABO, Rh: --/--/A POS (09/02 2330) Antibody: NEG (09/02 2330) Rubella: Immune (02/04 1447) RPR: Non Reactive (06/30 1022)  HBsAg: Negative (02/20 2054)  HIV: Non Reactive (06/30 1022)  GBS: Negative/-- (08/18 1124)  2 hr Glucola fasting elevated Genetic screening  NIPS LR, AFP negative Anatomy US normal  Prenatal Transfer Tool  Maternal Diabetes: Yes:  Diabetes Type:  Diet controlled Genetic Screening: Normal Maternal Ultrasounds/Referrals: Normal Fetal Ultrasounds or other Referrals:  None Maternal Substance Abuse:   No Significant Maternal Medications:  None Significant Maternal Lab Results: Group B Strep negative  Results for orders placed or performed during the hospital encounter of 11/11/20 (from the past 24 hour(s))  Fern Test   Collection Time: 11/11/20 10:30 PM  Result Value Ref Range   POCT Fern Test Positive = ruptured amniotic membanes   Resp Panel by RT-PCR (Flu A&B, Covid) Nasopharyngeal Swab   Collection Time: 11/11/20 11:13 PM   Specimen: Nasopharyngeal Swab; Nasopharyngeal(NP) swabs in vial transport medium  Result Value Ref Range   SARS Coronavirus 2 by RT PCR NEGATIVE NEGATIVE   Influenza A by PCR NEGATIVE NEGATIVE   Influenza B by PCR NEGATIVE NEGATIVE  Type and screen   Collection Time: 11/11/20 11:30 PM  Result Value Ref Range   ABO/RH(D) A POS    Antibody Screen NEG    Sample Expiration      11/14/2020,2359 Performed at [facility], [place] [address], [place], [place] [address]   CBC   Collection Time: 11/11/20 11:49 PM  Result Value Ref Range   WBC 11.4 (H) 4.0 - 10.5 K/uL   RBC 4.18 3.87 - 5.11 MIL/uL   Hemoglobin 12.7 12.0 - 15.0 g/dL   HCT 37.6 36.0 - 46.0 %   MCV 90.0 80.0 - 100.0 fL   MCH 30.4 26.0 - 34.0 pg   MCHC 33.8 30.0 - 36.0 g/dL   RDW 14.4 11.5 - 15.5 %   Platelets 232 150 - 400 K/uL   nRBC 0.0 0.0 - 0.2 %  Glucose, capillary   Collection Time: 11/12/20  1:11 AM  Result Value Ref Range   Glucose-Capillary 81 70 - 99 mg/dL    Patient Active Problem List   Diagnosis Date Noted   Gestational diabetes mellitus (GDM) in third trimester 09/15/2020   Supervision of high risk pregnancy, antepartum 08/18/2020   Obesity (BMI 30-39.9) 03/28/2017   Obesity in pregnancy 03/28/2017   AMA (advanced maternal age) multigravida 35+ 03/22/2017   Constipation 05/19/2015   IBS (irritable bowel syndrome) 05/19/2015   Hypertriglyceridemia 05/15/2013   Heart palpitations 04/15/2013    Assessment/Plan:  Cassandra [name] is a 40 y.o. [id number] at 27w0dhere for  SROM and labor, also with A1GDM  #Labor:expectant management #Pain: Labor support without medications #FWB: Cat I #ID:  GBS negative #MOF: bottle #MOC: partner vasectomy #Circ:  Yes  ARenard Matter MD  11/12/2020, 2:53 AM

## 2020-11-11 NOTE — Procedures (Signed)
Cassandra [name] [birth year] [redacted]w[redacted]d Fetus A Non-Stress Test Interpretation for 11/11/20  Indication: Advanced Maternal Age >40 years  Fetal Heart Rate A Mode: External Baseline Rate (A): 135 bpm Variability: Moderate Accelerations: 15 x 15 Decelerations: None Multiple birth?: No  Uterine Activity Mode: Palpation, Toco Contraction Frequency (min): none Resting Tone Palpated: Relaxed  Interpretation (Fetal Testing) Nonstress Test Interpretation: Reactive Overall Impression: Reassuring for gestational age Comments: Dr. FAnnamaria Bootsreviewed tracing

## 2020-11-12 ENCOUNTER — Encounter (HOSPITAL_COMMUNITY): Payer: Self-pay | Admitting: Obstetrics & Gynecology

## 2020-11-12 DIAGNOSIS — Z3A38 38 weeks gestation of pregnancy: Secondary | ICD-10-CM | POA: Diagnosis not present

## 2020-11-12 DIAGNOSIS — O24420 Gestational diabetes mellitus in childbirth, diet controlled: Secondary | ICD-10-CM | POA: Diagnosis not present

## 2020-11-12 DIAGNOSIS — O4202 Full-term premature rupture of membranes, onset of labor within 24 hours of rupture: Secondary | ICD-10-CM | POA: Diagnosis not present

## 2020-11-12 LAB — CBC
HCT: 36.8 % (ref 36.0–46.0)
HCT: 37.6 % (ref 36.0–46.0)
Hemoglobin: 12.6 g/dL (ref 12.0–15.0)
Hemoglobin: 12.7 g/dL (ref 12.0–15.0)
MCH: 30.4 pg (ref 26.0–34.0)
MCH: 30.7 pg (ref 26.0–34.0)
MCHC: 33.8 g/dL (ref 30.0–36.0)
MCHC: 34.2 g/dL (ref 30.0–36.0)
MCV: 89.5 fL (ref 80.0–100.0)
MCV: 90 fL (ref 80.0–100.0)
Platelets: 232 10*3/uL (ref 150–400)
Platelets: 234 10*3/uL (ref 150–400)
RBC: 4.11 MIL/uL (ref 3.87–5.11)
RBC: 4.18 MIL/uL (ref 3.87–5.11)
RDW: 14.2 % (ref 11.5–15.5)
RDW: 14.4 % (ref 11.5–15.5)
WBC: 11.4 10*3/uL — ABNORMAL HIGH (ref 4.0–10.5)
WBC: 20.7 10*3/uL — ABNORMAL HIGH (ref 4.0–10.5)
nRBC: 0 % (ref 0.0–0.2)
nRBC: 0 % (ref 0.0–0.2)

## 2020-11-12 LAB — RESP PANEL BY RT-PCR (FLU A&B, COVID) ARPGX2
Influenza A by PCR: NEGATIVE
Influenza B by PCR: NEGATIVE
SARS Coronavirus 2 by RT PCR: NEGATIVE

## 2020-11-12 LAB — RPR: RPR Ser Ql: NONREACTIVE

## 2020-11-12 LAB — GLUCOSE, CAPILLARY: Glucose-Capillary: 81 mg/dL (ref 70–99)

## 2020-11-12 LAB — POCT FERN TEST: POCT Fern Test: POSITIVE

## 2020-11-12 MED ORDER — ONDANSETRON HCL 4 MG/2ML IJ SOLN: 4.0000 mg | INTRAMUSCULAR | Status: DC | PRN

## 2020-11-12 MED ORDER — MEASLES, MUMPS & RUBELLA VAC IJ SOLR: 0.5000 mL | Freq: Once | INTRAMUSCULAR | Status: DC

## 2020-11-12 MED ORDER — IBUPROFEN 600 MG PO TABS
600.0000 mg | ORAL_TABLET | Freq: Four times a day (QID) | ORAL | Status: DC
  Administered 2020-11-12 – 2020-11-13 (×4): 600 mg via ORAL
  Filled 2020-11-12 (×3): qty 1

## 2020-11-12 MED ORDER — FENTANYL CITRATE (PF) 100 MCG/2ML IJ SOLN
INTRAMUSCULAR | Status: AC
  Administered 2020-11-12: 100 ug via INTRAVENOUS
  Filled 2020-11-12: qty 2

## 2020-11-12 MED ORDER — DIBUCAINE (PERIANAL) 1 % EX OINT: 1.0000 "application " | TOPICAL_OINTMENT | CUTANEOUS | Status: DC | PRN

## 2020-11-12 MED ORDER — WITCH HAZEL-GLYCERIN EX PADS: 1.0000 "application " | MEDICATED_PAD | CUTANEOUS | Status: DC | PRN

## 2020-11-12 MED ORDER — ACETAMINOPHEN 325 MG PO TABS
650.0000 mg | ORAL_TABLET | ORAL | Status: DC | PRN
  Administered 2020-11-12 – 2020-11-13 (×2): 650 mg via ORAL
  Filled 2020-11-12 (×2): qty 2

## 2020-11-12 MED ORDER — TRANEXAMIC ACID-NACL 1000-0.7 MG/100ML-% IV SOLN
INTRAVENOUS | Status: AC
  Filled 2020-11-12: qty 100

## 2020-11-12 MED ORDER — SENNOSIDES-DOCUSATE SODIUM 8.6-50 MG PO TABS: 2.0000 | ORAL_TABLET | Freq: Every day | ORAL | Status: DC

## 2020-11-12 MED ORDER — BENZOCAINE-MENTHOL 20-0.5 % EX AERO: 1.0000 "application " | INHALATION_SPRAY | CUTANEOUS | Status: DC | PRN

## 2020-11-12 MED ORDER — COCONUT OIL OIL: 1.0000 "application " | TOPICAL_OIL | Status: DC | PRN

## 2020-11-12 MED ORDER — PRENATAL MULTIVITAMIN CH
1.0000 | ORAL_TABLET | Freq: Every day | ORAL | Status: DC
  Administered 2020-11-12: 1 via ORAL

## 2020-11-12 MED ORDER — ONDANSETRON HCL 4 MG PO TABS: 4.0000 mg | ORAL_TABLET | ORAL | Status: DC | PRN

## 2020-11-12 MED ORDER — DIPHENHYDRAMINE HCL 25 MG PO CAPS: 25.0000 mg | ORAL_CAPSULE | Freq: Four times a day (QID) | ORAL | Status: DC | PRN

## 2020-11-12 MED ORDER — TETANUS-DIPHTH-ACELL PERTUSSIS 5-2.5-18.5 LF-MCG/0.5 IM SUSY: 0.5000 mL | PREFILLED_SYRINGE | Freq: Once | INTRAMUSCULAR | Status: DC

## 2020-11-12 MED ORDER — MEDROXYPROGESTERONE ACETATE 150 MG/ML IM SUSP: 150.0000 mg | INTRAMUSCULAR | Status: DC | PRN

## 2020-11-12 MED ORDER — SIMETHICONE 80 MG PO CHEW: 80.0000 mg | CHEWABLE_TABLET | ORAL | Status: DC | PRN

## 2020-11-12 NOTE — Discharge Summary (Signed)
Postpartum Discharge Summary  Date of Service updated 11/13/20     Patient Name: Cassandra [name] DOB: Feb 07, 1981 MRN: [id number]  Date of admission: 11/11/2020 Delivery date:11/12/2020  Delivering provider: Renard Matter  Date of discharge: 11/13/2020  Admitting diagnosis: Multigravida of advanced maternal age [O09.529] Intrauterine pregnancy: [redacted]w[redacted]d    Secondary diagnosis:  Active Problems:   Heart palpitations   AMA (advanced maternal age) multigravida 35+   Obesity (BMI 30-39.9)   Supervision of high risk pregnancy, antepartum   Gestational diabetes mellitus (GDM) in third trimester   Vaginal delivery  Additional problems: None    Discharge diagnosis: Term Pregnancy Delivered and GDM A1                                              Post partum procedures: None Augmentation:  None Complications: None  Hospital course: Onset of Labor With Vaginal Delivery      40y.o. yo [id number] 346w1das admitted in Active Labor on 11/11/2020. Patient had an uncomplicated labor course as follows:  Membrane Rupture Time/Date: 9:50 PM ,11/12/2020   Delivery Method:Vaginal, Spontaneous  Episiotomy: None  Lacerations:  None  Patient had an uncomplicated postpartum course.  She is ambulating, tolerating a regular diet, passing flatus, and urinating well. Patient is discharged home in stable condition on 11/13/20.  Newborn Data: Birth date:11/12/2020  Birth time:3:06 AM  Gender:Female  Living status:Living  Apgars:9 ,9  Weight:3615 g   Magnesium Sulfate received: No BMZ received: No Rhophylac:N/A MMR:N/A T-DaP:Given prenatally Flu: N/A Transfusion:No  Physical exam  Vitals:   11/12/20 1130 11/12/20 1543 11/12/20 2015 11/13/20 0554  BP: (!) 105/53 136/80 114/61 109/81  Pulse: 69 86 70 60  Resp: _0 Temp: 98.4 F (36.9 C) 99 F (37.2 C) 98.2 F (36.8 C) 97.7 F (36.5 C)  TempSrc: Oral Oral Oral Oral  SpO2:   99% 100%  Weight:      Height:       General: alert, cooperative,  and no distress Lochia: appropriate Uterine Fundus: firm Incision: N/A DVT Evaluation: No evidence of DVT seen on physical exam. Negative Homan's sign. No cords or calf tenderness. No significant calf/ankle edema. Labs: Lab Results  Component Value Date   WBC 20.7 (H) 11/12/2020   HGB 12.6 11/12/2020   HCT 36.8 11/12/2020   MCV 89.5 11/12/2020   PLT 234 11/12/2020   CMP Latest Ref Rng & Units 12/07/2015  Glucose 70 - 99 mg/dL 83  BUN 6 - 23 mg/dL 14  Creatinine 0.40 - 1.20 mg/dL 0.63  Sodium 135 - 145 mEq/L 138  Potassium 3.5 - 5.1 mEq/L 4.1  Chloride 96 - 112 mEq/L 103  CO2 19 - 32 mEq/L 30  Calcium 8.4 - 10.5 mg/dL 9.4  Total Protein 6.0 - 8.3 g/dL 7.6  Total Bilirubin 0.2 - 1.2 mg/dL 0.5  Alkaline Phos 39 - 117 U/L 68  AST 0 - 37 U/L 13  ALT 0 - 35 U/L 14   Edinburgh Score: Edinburgh Postnatal Depression Scale Screening Tool 10/24/2017  I have been able to laugh and see the funny side of things. 0  I have looked forward with enjoyment to things. 0  I have blamed myself unnecessarily when things went wrong. 0  I have been anxious or worried for no good reason. 0  I  have felt scared or panicky for no good reason. 0  Things have been getting on top of me. 0  I have been so unhappy that I have had difficulty sleeping. 0  I have felt sad or miserable. 0  I have been so unhappy that I have been crying. 0  The thought of harming myself has occurred to me. 0  Edinburgh Postnatal Depression Scale Total 0     After visit meds:  Allergies as of 11/13/2020   No Known Allergies      Medication List     STOP taking these medications    Accu-Chek Guide test strip Generic drug: glucose blood   Accu-Chek Softclix Lancets lancets   Blood Pressure Kit   cetirizine 10 MG tablet Commonly known as: ZYRTEC   fluticasone 50 MCG/ACT nasal spray Commonly known as: FLONASE       TAKE these medications    ibuprofen 600 MG tablet Commonly known as: ADVIL Take 1 tablet  (600 mg total) by mouth every 6 (six) hours.   Prenatal 27-1 MG Tabs Take by mouth.         Discharge home in stable condition Infant Feeding: Bottle Infant Disposition:home with mother Discharge instruction: per After Visit Summary and Postpartum booklet. Activity: Advance as tolerated. Pelvic rest for 6 weeks.  Diet: routine diet Future Appointments: Future Appointments  Date Time Provider [place]  11/17/2020 10:15 AM Starr Lake, CNM [facility]  11/18/2020  1:00 PM [facility] NURSE [facility]  11/18/2020  1:15 PM [facility] NST [facility] [place]   Follow up Visit:  [place] [facility] [place] [facility] Follow up.   Specialty: Obstetrics and Gynecology Why: 4-6 weeks for your postpartum visit Contact information: [place] [id number] [phone]               Message sent to [facility] by Dr. Cy Blamer on 9/3 Please schedule this patient for a In person postpartum visit in 4 weeks with the following provider: MD. Additional Postpartum F/U:2 hour GTT  High risk pregnancy complicated by: GDM Delivery mode:  Vaginal, Spontaneous  Anticipated Birth Control:   partner had Vasectomy   11/13/2020 Roma Schanz, CNM

## 2020-11-13 LAB — NO BLOOD PRODUCTS

## 2020-11-13 LAB — TYPE AND SCREEN
ABO/RH(D): A POS
Antibody Screen: NEGATIVE

## 2020-11-13 NOTE — Discharge Instructions (Signed)
NO SEX UNTIL AFTER YOU GET YOUR BIRTH CONTROL  

## 2020-11-17 ENCOUNTER — Encounter: Payer: Medicaid Other | Admitting: Student

## 2020-11-18 ENCOUNTER — Other Ambulatory Visit: Payer: Medicaid Other

## 2020-11-18 ENCOUNTER — Ambulatory Visit: Payer: Medicaid Other

## 2020-11-20 ENCOUNTER — Inpatient Hospital Stay (HOSPITAL_COMMUNITY)
Admission: AD | Admit: 2020-11-20 | Payer: Medicaid Other | Source: Home / Self Care | Admitting: Obstetrics & Gynecology

## 2020-11-20 ENCOUNTER — Inpatient Hospital Stay (HOSPITAL_COMMUNITY): Payer: Medicaid Other

## 2020-11-26 ENCOUNTER — Telehealth (HOSPITAL_COMMUNITY): Payer: Self-pay

## 2020-11-26 NOTE — Telephone Encounter (Signed)
  No answer. Left message to return nurse call.   Sharyn Lull [facility] 11/26/2020,1119

## 2020-12-28 ENCOUNTER — Encounter: Payer: Self-pay | Admitting: Family Medicine

## 2020-12-28 ENCOUNTER — Ambulatory Visit: Payer: Medicaid Other | Admitting: Family Medicine

## 2020-12-28 NOTE — Progress Notes (Signed)
Patient did not keep appointment today. She will be called to reschedule.  

## 2021-01-04 ENCOUNTER — Ambulatory Visit: Payer: Medicaid Other | Admitting: Family Medicine

## 2021-01-10 DIAGNOSIS — Z23 Encounter for immunization: Secondary | ICD-10-CM | POA: Diagnosis not present

## 2021-01-24 ENCOUNTER — Ambulatory Visit: Payer: Medicaid Other | Admitting: Family Medicine

## 2021-02-07 ENCOUNTER — Ambulatory Visit: Payer: Medicaid Other | Admitting: Obstetrics and Gynecology

## 2021-02-22 ENCOUNTER — Encounter: Payer: Self-pay | Admitting: Family Medicine

## 2021-02-22 ENCOUNTER — Ambulatory Visit (INDEPENDENT_AMBULATORY_CARE_PROVIDER_SITE_OTHER): Payer: Medicaid Other | Admitting: Family Medicine

## 2021-02-22 ENCOUNTER — Other Ambulatory Visit: Payer: Self-pay

## 2021-02-22 DIAGNOSIS — Z1231 Encounter for screening mammogram for malignant neoplasm of breast: Secondary | ICD-10-CM

## 2021-02-22 DIAGNOSIS — Z1239 Encounter for other screening for malignant neoplasm of breast: Secondary | ICD-10-CM

## 2021-02-22 DIAGNOSIS — O24410 Gestational diabetes mellitus in pregnancy, diet controlled: Secondary | ICD-10-CM

## 2021-02-22 NOTE — Progress Notes (Signed)
Post Partum Visit Note  Cassandra [name] is a 40 y.o. [phone] female who presents for a postpartum visit. She is  12  weeks postpartum following a normal spontaneous vaginal delivery.  I have fully reviewed the prenatal and intrapartum course. The delivery was at 38.1 gestational weeks.  Anesthesia: none. Postpartum course has been uneventful. Baby is doing well. Baby is feeding by bottle - Enfamil Gentle Ease Pro . Bleeding no bleeding. Bowel function is normal. Bladder function is normal. Patient is sexually active. Contraception method is vasectomy. Postpartum depression screening: negative.   The pregnancy intention screening data noted above was reviewed. Potential methods of contraception were discussed. The patient elected to proceed with No data recorded.   Edinburgh Postnatal Depression Scale - 02/22/21 1117       Edinburgh Postnatal Depression Scale:  In the Past 7 Days   I have been able to laugh and see the funny side of things. 0    I have looked forward with enjoyment to things. 0    I have blamed myself unnecessarily when things went wrong. 0    I have been anxious or worried for no good reason. 0    I have felt scared or panicky for no good reason. 0    Things have been getting on top of me. 0    I have been so unhappy that I have had difficulty sleeping. 0    I have felt sad or miserable. 0    I have been so unhappy that I have been crying. 0    The thought of harming myself has occurred to me. 0    Edinburgh Postnatal Depression Scale Total 0             Health Maintenance Due  Topic Date Due   Pneumococcal Vaccine 66-56 Years old (1 - PCV) Never done   URINE MICROALBUMIN  Never done   COVID-19 Vaccine (3 - Booster for Pfizer series) 02/06/2020   PAP SMEAR-Modifier  03/22/2020    The following portions of the patient's history were reviewed and updated as appropriate: allergies, current medications, past family history, past medical history, past social  history, past surgical history, and problem list.  Review of Systems Pertinent items noted in HPI and remainder of comprehensive ROS otherwise negative.  Objective:  BP (!) 133/92    Pulse 100    Ht 5\' 3"  (1.6 m)    Wt 200 lb 3.2 oz (90.8 kg)    LMP 01/30/2021 (Exact Date)    Breastfeeding No    BMI 35.46 kg/m    General:  alert, cooperative, and appears stated age   Breasts:  not indicated  Lungs: Comfortable on room air  Wound N/a  GU exam:  not indicated       Assessment:    There are no diagnoses linked to this encounter.  Normal postpartum exam.   Plan:   Essential components of care per ACOG recommendations:  1.  Mood and well being: Patient with negative depression screening today. Reviewed local resources for support.  - Patient tobacco use? No.   - hx of drug use? No.    2. Infant care and feeding:  -Patient currently breastmilk feeding? No.  -Social determinants of health (SDOH) reviewed in EPIC. No concerns  3. Sexuality, contraception and birth spacing - Patient does not want a pregnancy in the next year.  Desired family size is 5 children.  - Reviewed forms of contraception in tiered  fashion. Patient desired  vasectomy  today. Was done in May and has been cleared    4. Sleep and fatigue -Encouraged family/partner/community support of 4 hrs of uninterrupted sleep to help with mood and fatigue  5. Physical Recovery  - Discussed patients delivery and complications. She describes her labor as good. - Patient had a Vaginal, no problems at delivery. Patient had a  no  laceration. Perineal healing reviewed. Patient expressed understanding - Patient has urinary incontinence? No. - Patient is safe to resume physical and sexual activity  6.  Health Maintenance - HM due items addressed Yes - Last pap smear  Diagnosis  Date Value Ref Range Status  03/22/2017   Final   NEGATIVE FOR INTRAEPITHELIAL LESIONS OR MALIGNANCY.   Pap smear not done at today's visit.   -Breast Cancer screening indicated? Yes. Patient referred today for mammogram.   7. Chronic Disease/Pregnancy Condition follow up: Gestational Diabetes - A1c today (three months postpartum) - PCP follow up  Clarnce Flock, MD [facility] [place] [place]

## 2021-02-23 LAB — HEMOGLOBIN A1C
Est. average glucose Bld gHb Est-mCnc: 103 mg/dL
Hgb A1c MFr Bld: 5.2 % (ref 4.8–5.6)

## 2021-04-06 ENCOUNTER — Ambulatory Visit
Admission: RE | Admit: 2021-04-06 | Discharge: 2021-04-06 | Disposition: A | Discharge status: Home / Self Care | Payer: Medicaid Other | Source: Ambulatory Visit | Attending: Family Medicine | Admitting: Family Medicine

## 2021-04-06 DIAGNOSIS — Z1231 Encounter for screening mammogram for malignant neoplasm of breast: Secondary | ICD-10-CM | POA: Diagnosis not present

## 2022-05-17 ENCOUNTER — Other Ambulatory Visit: Payer: Self-pay | Admitting: Physician Assistant

## 2022-05-17 DIAGNOSIS — Z1231 Encounter for screening mammogram for malignant neoplasm of breast: Secondary | ICD-10-CM

## 2023-12-18 IMAGING — MG MM DIGITAL SCREENING BILAT W/ TOMO AND CAD
8 series · 8 of 24 positions shown · non-contrast
Comparison: Previous exam(s).

CLINICAL DATA: Screening.

EXAM:
DIGITAL SCREENING BILATERAL MAMMOGRAM WITH TOMOSYNTHESIS AND CAD
TECHNIQUE: Bilateral screening digital craniocaudal and mediolateral oblique
mammograms were obtained. Bilateral screening digital breast
tomosynthesis was performed. The images were evaluated with
computer-aided detection.

[R CC synth-2D]
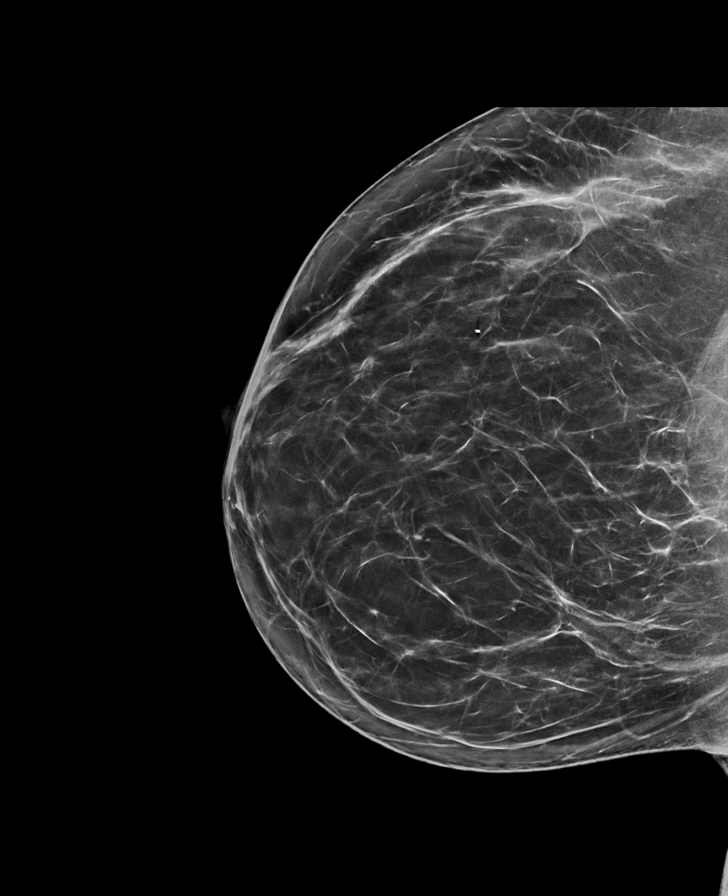

[L MLO synth-2D]
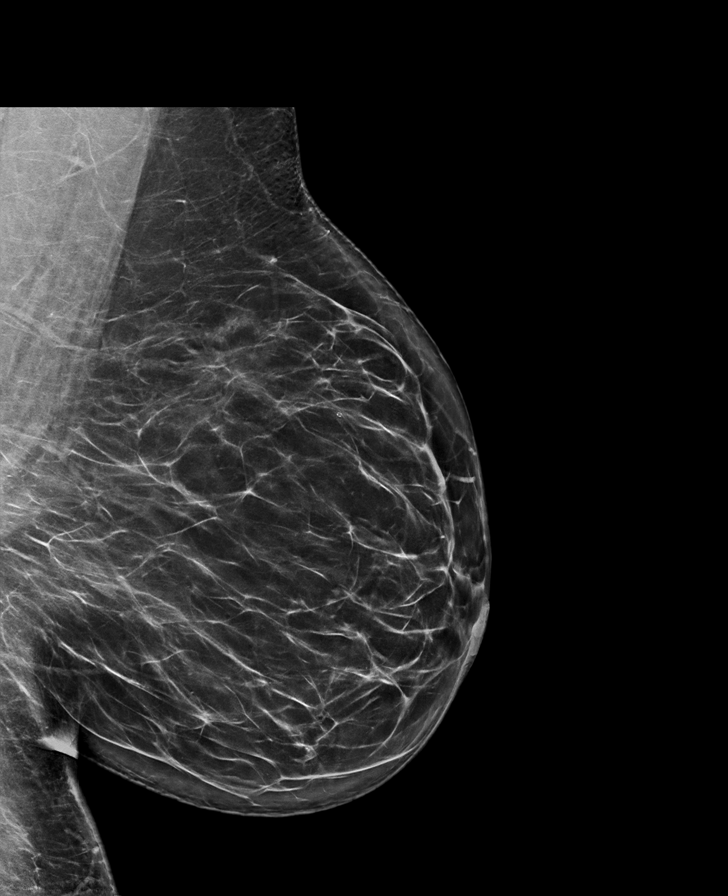

[R MLO synth-2D]
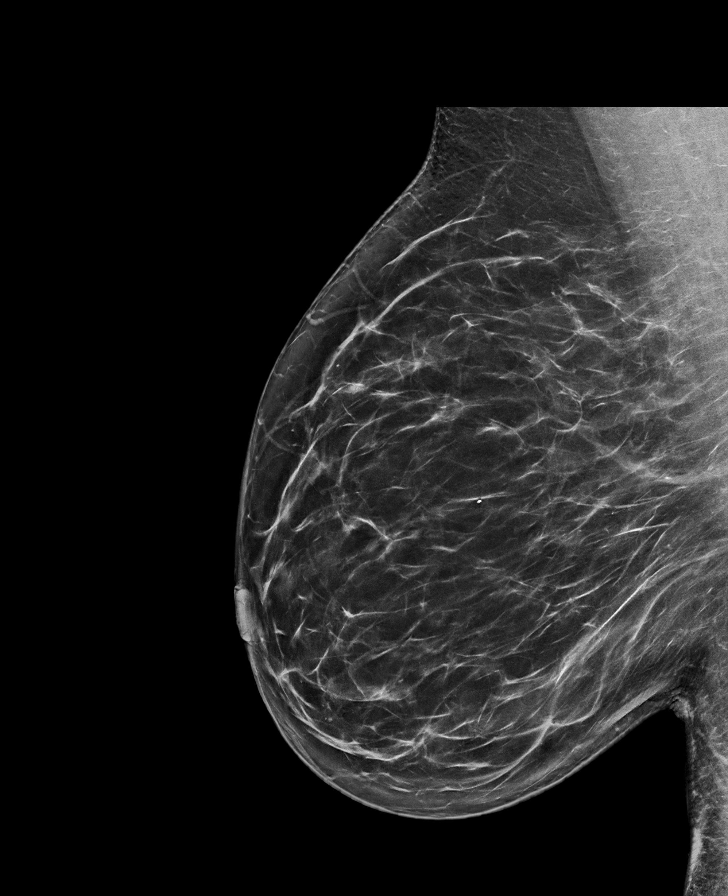

[L CC synth-2D]
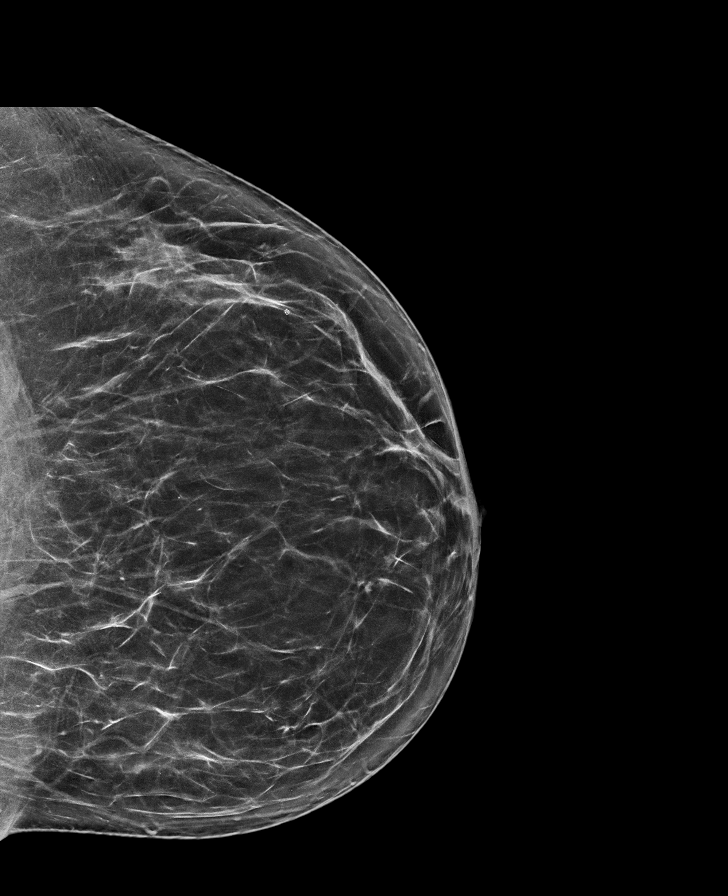

[R MLO tomo · tomo slice 43/85.0]
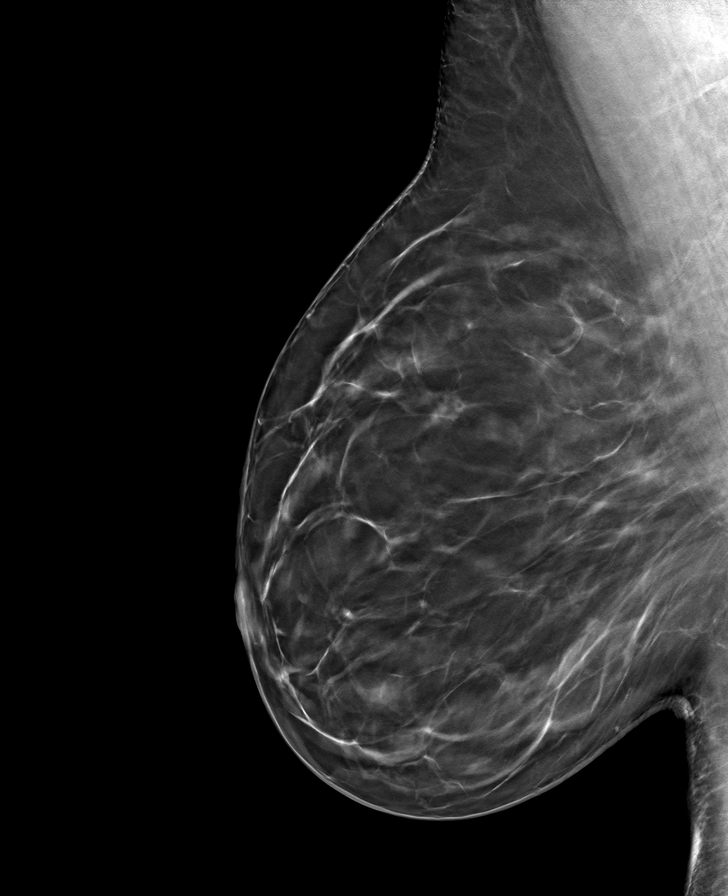

[L CC tomo · tomo slice 37/74.0]
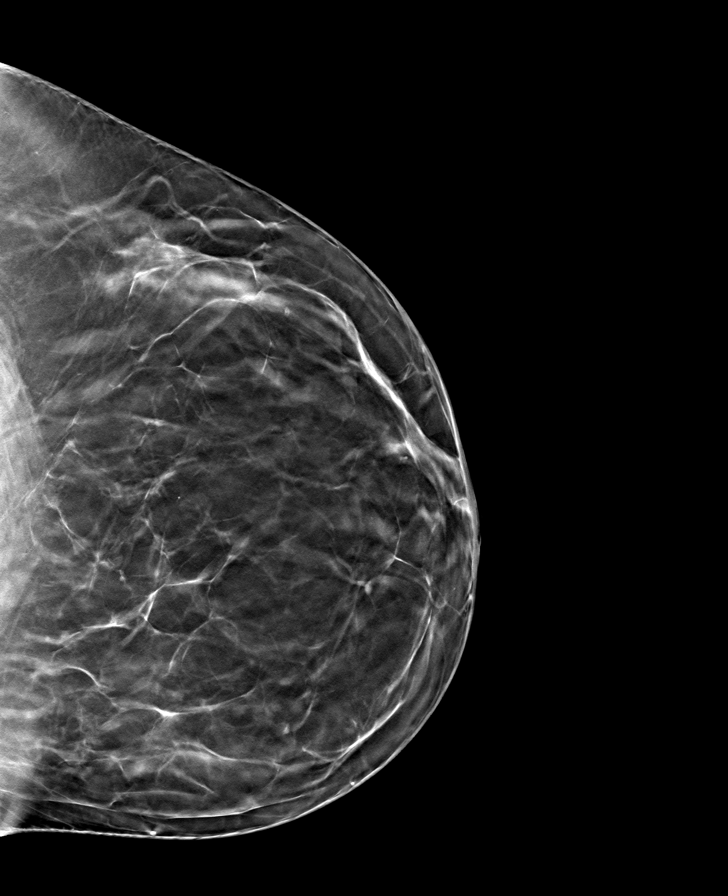

[L MLO tomo · tomo slice 43/86.0]
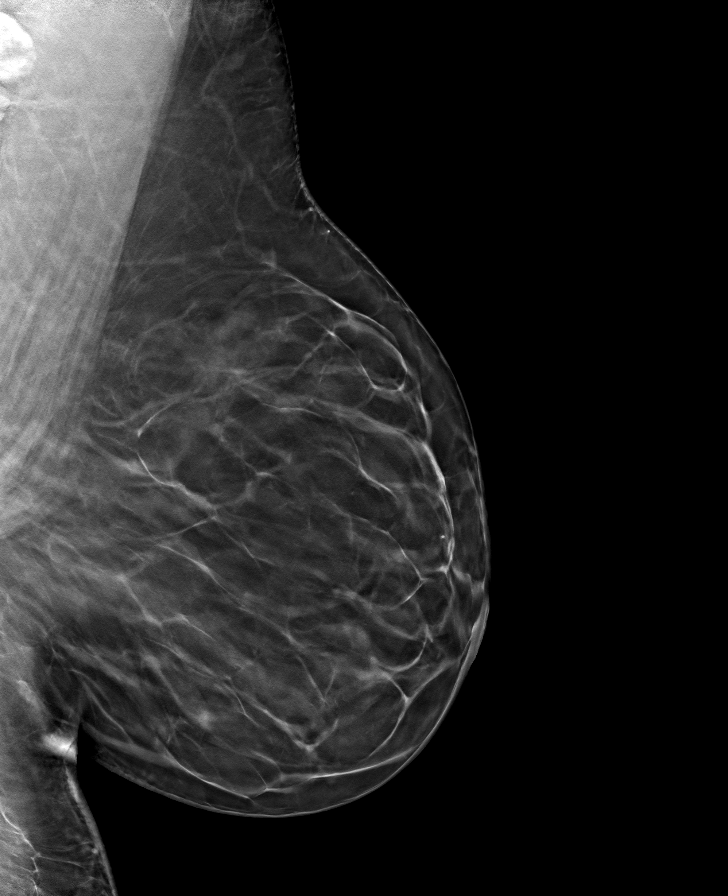

[R CC tomo · tomo slice 37/74.0]
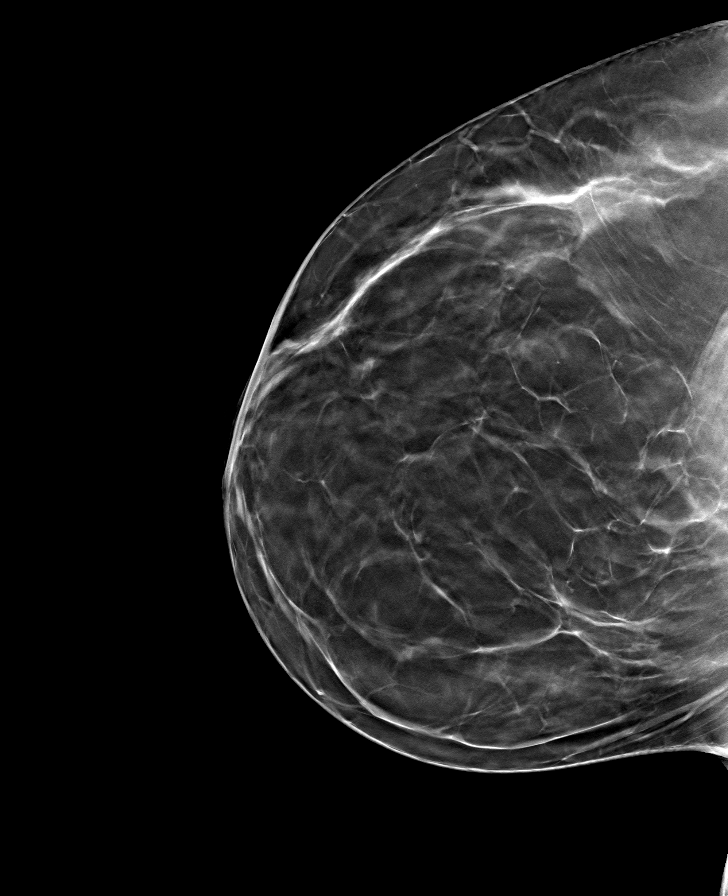

[8 of 24 positions shown; findings below may reference images not displayed]

ACR Breast Density Category b: There are scattered areas of
fibroglandular density.
FINDINGS: There are no findings suspicious for malignancy.
IMPRESSION: No mammographic evidence of malignancy. A result letter of this
screening mammogram will be mailed directly to the patient.

RECOMMENDATION:
Screening mammogram in one year. (Code:51-O-LD2)

BI-RADS CATEGORY  1: Negative.

## 2023-12-27 ENCOUNTER — Emergency Department (HOSPITAL_BASED_OUTPATIENT_CLINIC_OR_DEPARTMENT_OTHER)
Admission: EM | Admit: 2023-12-27 | Discharge: 2023-12-27 | Disposition: A | Discharge status: Home / Self Care | Payer: Self-pay | Attending: Emergency Medicine | Admitting: Emergency Medicine

## 2023-12-27 ENCOUNTER — Encounter (HOSPITAL_BASED_OUTPATIENT_CLINIC_OR_DEPARTMENT_OTHER): Payer: Self-pay

## 2023-12-27 ENCOUNTER — Other Ambulatory Visit: Payer: Self-pay

## 2023-12-27 ENCOUNTER — Emergency Department (HOSPITAL_BASED_OUTPATIENT_CLINIC_OR_DEPARTMENT_OTHER): Payer: Self-pay

## 2023-12-27 DIAGNOSIS — R202 Paresthesia of skin: Secondary | ICD-10-CM | POA: Insufficient documentation

## 2023-12-27 LAB — BASIC METABOLIC PANEL WITH GFR
Anion gap: 12 (ref 5–15)
BUN: 22 mg/dL — ABNORMAL HIGH (ref 6–20)
CO2: 24 mmol/L (ref 22–32)
Calcium: 9.3 mg/dL (ref 8.9–10.3)
Chloride: 103 mmol/L (ref 98–111)
Creatinine, Ser: 0.86 mg/dL (ref 0.44–1.00)
GFR, Estimated: >60 mL/min (ref >60)
Glucose, Bld: 98 mg/dL (ref 70–99)
Potassium: 3.8 mmol/L (ref 3.5–5.1)
Sodium: 139 mmol/L (ref 135–145)

## 2023-12-27 LAB — CBC WITH DIFFERENTIAL/PLATELET
Abs Immature Granulocytes: 0.02 K/uL (ref 0.00–0.07)
Basophils Absolute: 0.1 K/uL (ref 0.0–0.1)
Basophils Relative: 1 %
Eosinophils Absolute: 0.1 K/uL (ref 0.0–0.5)
Eosinophils Relative: 2 %
HCT: 34.3 % — ABNORMAL LOW (ref 36.0–46.0)
Hemoglobin: 11.4 g/dL — ABNORMAL LOW (ref 12.0–15.0)
Immature Granulocytes: 0 %
Lymphocytes Relative: 28 %
Lymphs Abs: 2.2 K/uL (ref 0.7–4.0)
MCH: 27.7 pg (ref 26.0–34.0)
MCHC: 33.2 g/dL (ref 30.0–36.0)
MCV: 83.3 fL (ref 80.0–100.0)
Monocytes Absolute: 0.7 K/uL (ref 0.1–1.0)
Monocytes Relative: 9 %
Neutro Abs: 4.8 K/uL (ref 1.7–7.7)
Neutrophils Relative %: 60 %
Platelets: 294 K/uL (ref 150–400)
RBC: 4.12 MIL/uL (ref 3.87–5.11)
RDW: 14.3 % (ref 11.5–15.5)
WBC: 7.9 K/uL (ref 4.0–10.5)
nRBC: 0 % (ref 0.0–0.2)

## 2023-12-27 LAB — TROPONIN T, HIGH SENSITIVITY: Troponin T High Sensitivity: <15 ng/L (ref 0–19)

## 2023-12-27 NOTE — ED Provider Notes (Signed)
 [place] [facility]  Provider Note  CSN: [id number] Arrival date & time: 12/27/23 0131  History Chief Complaint  Patient presents with   Numbness   Jaw Pain    Cassandra [name] is a 43 y.o. female with no significant PMH reports she has been feeling 'off' for the last couple of weeks. Went to UC on 10/7 for headache and felt to have migraine. She was offered IM meds but preferred to take orals at home. She took motrin  and benadryl  for a few days with improvement. She noticed some aching pain in her L face 10/15 with maybe some tingling there and a brief period of blood shot left eye without visual changes. Earlier in the day on 10/16 she noticed occasional tingling in L arm and L leg with sharp pains in the back of both limbs. Symptoms were not associated with weakness. No loss of sensations, felt like it had fallen asleep.    Home Medications Prior to Admission medications   Not on File     Allergies    Patient has no known allergies.   Review of Systems   Review of Systems Please see HPI for pertinent positives and negatives  Physical Exam BP 121/82   Pulse 77   Temp 98.8 F (37.1 C) (Oral)   Resp 16   Ht 5' 3 (1.6 m)   Wt 76.7 kg   LMP 11/22/2023 (Exact Date)   SpO2 99%   BMI 29.94 kg/m   Physical Exam Vitals and nursing note reviewed.  Constitutional:      Appearance: Normal appearance.  HENT:     Head: Normocephalic and atraumatic.     Nose: Nose normal.     Mouth/Throat:     Mouth: Mucous membranes are moist.  Eyes:     Extraocular Movements: Extraocular movements intact.     Conjunctiva/sclera: Conjunctivae normal.  Cardiovascular:     Rate and Rhythm: Normal rate.  Pulmonary:     Effort: Pulmonary effort is normal.     Breath sounds: Normal breath sounds.  Abdominal:     General: Abdomen is flat.     Palpations: Abdomen is soft.     Tenderness: There is no abdominal tenderness.  Musculoskeletal:         General: No swelling. Normal range of motion.     Cervical back: Neck supple.  Skin:    General: Skin is warm and dry.  Neurological:     General: No focal deficit present.     Mental Status: She is alert and oriented to person, place, and time.     Cranial Nerves: No cranial nerve deficit.     Sensory: No sensory deficit.     Motor: No weakness.     Coordination: Coordination normal.     Gait: Gait normal.     Deep Tendon Reflexes: Reflexes normal.  Psychiatric:        Mood and Affect: Mood normal.     ED Results / Procedures / Treatments   EKG EKG Interpretation Date/Time:  Friday December 27 2023 01:58:34 EDT Ventricular Rate:  75 PR Interval:  148 QRS Duration:  88 QT Interval:  379 QTC Calculation: 424 R Axis:   69  Text Interpretation: Sinus rhythm Normal ECG No significant change since last tracing Confirmed by Roselyn Dunnings [phone]) on 12/27/2023 3:00:16 AM  Procedures Procedures  Medications Ordered in the ED Medications - No data to display  Initial Impression and Plan  Patient here with intermittent paresthesias, mostly on left side. She has a normal neuro exam now, no concerns for acute ischemic stroke. She has had some headaches recently, so will check CT to eval mass lesion, otherwise labs done in triage show unremarkable CBC, BMP and Trop.  ED Course   Clinical Course as of 12/27/23 0414  Fri Dec 27, 2023  [id number] I personally viewed the images from radiology studies and agree with radiologist interpretation: CT is normal. Plan discharge with outpatient PCP and/or Neurology follow up for further evaluation of her paresthesias. RTED for any other concerns.  [CS]    Clinical Course User Index [CS] Roselyn Carlin NOVAK, MD     MDM Rules/Calculators/A&P Medical Decision Making Problems Addressed: Paresthesia: acute illness or injury  Amount and/or Complexity of Data Reviewed Labs: ordered. Decision-making details documented in ED Course. Radiology:  ordered and independent interpretation performed. Decision-making details documented in ED Course. ECG/medicine tests: ordered and independent interpretation performed. Decision-making details documented in ED Course.     Final Clinical Impression(s) / ED Diagnoses Final diagnoses:  Paresthesia    Rx / DC Orders ED Discharge Orders          Ordered    Ambulatory referral to Neurology       Comments: An appointment is requested in approximately: 2 weeks   12/27/23 0413             Roselyn Carlin NOVAK, MD 12/27/23 [phone]

## 2023-12-27 NOTE — ED Triage Notes (Addendum)
 Describes left side of face was numb yesterday-resolved slightly  Pt states she was laying down and developed numbness left arm then leg and moved back up to arm and face (this was @0100 ) NIH 0 Alert and oriented C/o left jaw and left shoulder pain +nausea
